# Patient Record
Sex: Male | Born: 1955
Health system: Southern US, Community
[De-identification: ages and names within clinical notes are randomized; demographics above are authoritative.]

## PROBLEM LIST (undated history)

## (undated) DIAGNOSIS — G5600 Carpal tunnel syndrome, unspecified upper limb: Secondary | ICD-10-CM

## (undated) DIAGNOSIS — I517 Cardiomegaly: Secondary | ICD-10-CM

## (undated) DIAGNOSIS — N529 Male erectile dysfunction, unspecified: Secondary | ICD-10-CM

## (undated) DIAGNOSIS — Z9189 Other specified personal risk factors, not elsewhere classified: Secondary | ICD-10-CM

## (undated) DIAGNOSIS — E291 Testicular hypofunction: Secondary | ICD-10-CM

## (undated) DIAGNOSIS — C61 Malignant neoplasm of prostate: Secondary | ICD-10-CM

## (undated) DIAGNOSIS — H933X9 Disorders of unspecified acoustic nerve: Secondary | ICD-10-CM

## (undated) DIAGNOSIS — M25569 Pain in unspecified knee: Secondary | ICD-10-CM

## (undated) DIAGNOSIS — H814 Vertigo of central origin: Secondary | ICD-10-CM

## (undated) DIAGNOSIS — I10 Essential (primary) hypertension: Secondary | ICD-10-CM

## (undated) DIAGNOSIS — E785 Hyperlipidemia, unspecified: Secondary | ICD-10-CM

## (undated) DIAGNOSIS — E119 Type 2 diabetes mellitus without complications: Secondary | ICD-10-CM

## (undated) DIAGNOSIS — N400 Enlarged prostate without lower urinary tract symptoms: Secondary | ICD-10-CM

## (undated) DIAGNOSIS — R399 Unspecified symptoms and signs involving the genitourinary system: Secondary | ICD-10-CM

## (undated) DIAGNOSIS — Z973 Presence of spectacles and contact lenses: Secondary | ICD-10-CM

## (undated) DIAGNOSIS — I639 Cerebral infarction, unspecified: Secondary | ICD-10-CM

## (undated) HISTORY — DX: Carpal tunnel syndrome, unspecified upper limb: G56.00

## (undated) HISTORY — DX: Disorders of unspecified acoustic nerve: H93.3X9

## (undated) HISTORY — PX: PROSTATE BIOPSY: SHX241

## (undated) HISTORY — DX: Testicular hypofunction: E29.1

## (undated) HISTORY — DX: Cerebral infarction, unspecified: I63.9

## (undated) HISTORY — PX: UMBILICAL HERNIA REPAIR: SHX196

## (undated) HISTORY — PX: COLONOSCOPY: SHX174

## (undated) HISTORY — DX: Malignant neoplasm of prostate: C61

## (undated) HISTORY — DX: Pain in unspecified knee: M25.569

## (undated) HISTORY — DX: Cardiomegaly: I51.7

## (undated) HISTORY — DX: Vertigo of central origin: H81.4

## (undated) HISTORY — PX: OTHER SURGICAL HISTORY: SHX169

## (undated) HISTORY — DX: Hyperlipidemia, unspecified: E78.5

## (undated) HISTORY — PX: UPPER GASTROINTESTINAL ENDOSCOPY: SHX188

## (undated) HISTORY — DX: Essential (primary) hypertension: I10

---

## 2000-09-21 ENCOUNTER — Ambulatory Visit (HOSPITAL_COMMUNITY): Admission: RE | Admit: 2000-09-21 | Discharge: 2000-09-21 | Payer: Self-pay | Admitting: Gastroenterology

## 2000-11-02 ENCOUNTER — Ambulatory Visit (HOSPITAL_COMMUNITY): Admission: RE | Admit: 2000-11-02 | Discharge: 2000-11-02 | Payer: Self-pay | Admitting: Gastroenterology

## 2000-11-24 ENCOUNTER — Encounter: Admission: RE | Admit: 2000-11-24 | Discharge: 2000-11-24 | Payer: Self-pay | Admitting: Gastroenterology

## 2000-11-24 ENCOUNTER — Encounter: Payer: Self-pay | Admitting: Gastroenterology

## 2003-11-05 ENCOUNTER — Ambulatory Visit (HOSPITAL_COMMUNITY): Admission: RE | Admit: 2003-11-05 | Discharge: 2003-11-05 | Payer: Self-pay | Admitting: General Surgery

## 2004-11-27 ENCOUNTER — Emergency Department (HOSPITAL_COMMUNITY): Admission: EM | Admit: 2004-11-27 | Discharge: 2004-11-27 | Payer: Self-pay | Admitting: Emergency Medicine

## 2009-08-24 DIAGNOSIS — E785 Hyperlipidemia, unspecified: Secondary | ICD-10-CM | POA: Insufficient documentation

## 2012-08-08 HISTORY — PX: ROTATOR CUFF REPAIR: SHX139

## 2013-12-31 ENCOUNTER — Other Ambulatory Visit: Payer: Self-pay | Admitting: Endocrinology

## 2013-12-31 DIAGNOSIS — R519 Headache, unspecified: Secondary | ICD-10-CM

## 2013-12-31 DIAGNOSIS — R51 Headache: Principal | ICD-10-CM

## 2014-01-02 ENCOUNTER — Ambulatory Visit
Admission: RE | Admit: 2014-01-02 | Discharge: 2014-01-02 | Disposition: A | Discharge status: Home / Self Care | Payer: Managed Care, Other (non HMO) | Source: Ambulatory Visit | Attending: Endocrinology | Admitting: Endocrinology

## 2014-01-02 ENCOUNTER — Other Ambulatory Visit: Payer: Self-pay

## 2014-01-02 DIAGNOSIS — R51 Headache: Principal | ICD-10-CM

## 2014-01-02 DIAGNOSIS — R519 Headache, unspecified: Secondary | ICD-10-CM

## 2014-11-14 ENCOUNTER — Encounter: Payer: Self-pay | Admitting: Neurology

## 2014-11-18 ENCOUNTER — Encounter: Payer: Self-pay | Admitting: Neurology

## 2014-11-18 ENCOUNTER — Ambulatory Visit (INDEPENDENT_AMBULATORY_CARE_PROVIDER_SITE_OTHER): Payer: Managed Care, Other (non HMO) | Admitting: Neurology

## 2014-11-18 VITALS — BP 190/92 | HR 90 | Resp 24 | Ht 69.0 in | Wt 239.7 lb

## 2014-11-18 DIAGNOSIS — R42 Dizziness and giddiness: Secondary | ICD-10-CM

## 2014-11-18 DIAGNOSIS — I10 Essential (primary) hypertension: Secondary | ICD-10-CM | POA: Insufficient documentation

## 2014-11-18 NOTE — Progress Notes (Signed)
NEUROLOGY CONSULTATION NOTE  George Franco MRN: [id number] DOB: 08/20/1955  Referring provider: Dr. Forde Dandy Primary care provider: Dr. Forde Dandy  Reason for consult:  Dizziness  HISTORY OF PRESENT ILLNESS: George Franco is a 59 year old right-handed man with type 2 diabetes, BPH, hyperlipidemia and hypertension and former smoker who presents for headache.  Records and some labs reviewed.  Symptoms started about a year ago.  When he goes to sleep, he feels okay.  When he wakes up in the morning, he feels okay, until he gets up out of bed.  He develops a sensation of dizziness and unsteadiness.  There is some component of spinning sensation.  There is no lightheadedness, nausea, slurred speech, focal numbness or weakness or vision loss.  The symptoms fluctuate but persist throughout the day, however they tend to improve as the day progresses and he usually feels fine by bedtime.  It is exacerbated by movement but not completely positional.  Driving can exacerbate symptoms.  The headache is very mild and occasional.  It doesn't bother him as much as the dizziness.  However, sometimes he notes that the top of his head feels numb when he touches it.  He denies neck pain.  He denies tinnitus or hearing loss.  He has been taking meclizine, which is ineffective.  He had a CT of the head that was reportedly unremarkable.  His systolic blood pressure today is 190.  Prior readings have been 329 and 924 systolic.  He thinks that his blood pressure has not been controlled for some time, at least a year.  Sometimes, he notes flashes in the corner of his eyes.  PAST MEDICAL HISTORY: Past Medical History  Diagnosis Date  . Diabetes   . Hypertension   . Hyperlipidemia   . LVH (left ventricular hypertrophy)   . BPH (benign prostatic hypertrophy)   . CTS (carpal tunnel syndrome)   . Hypogonadism in male     PAST SURGICAL HISTORY: Past Surgical History  Procedure Laterality Date  . Hernia repair    .  Rotator cuff repair  2014    MEDICATIONS: Current Outpatient Prescriptions on File Prior to Visit  Medication Sig Dispense Refill  . amLODipine (NORVASC) 2.5 MG tablet Take 2.5 mg by mouth daily.    . benazepril (LOTENSIN) 40 MG tablet Take 40 mg by mouth daily.    . Choline Fenofibrate 135 MG capsule Take 135 mg by mouth daily.    . empagliflozin (JARDIANCE) 25 MG TABS tablet Take 25 mg by mouth daily.    . insulin lispro (HUMALOG) 100 UNIT/ML injection Inject into the skin 3 (three) times daily before meals. 40-30-40    . meclizine (ANTIVERT) 25 MG tablet Take 25 mg by mouth 3 (three) times daily as needed for dizziness.    Marland Kitchen omega-3 acid ethyl esters (LOVAZA) 1 G capsule Take by mouth 2 (two) times daily.    . tadalafil (CIALIS) 5 MG tablet Take 5 mg by mouth daily as needed for erectile dysfunction.     No current facility-administered medications on file prior to visit.    ALLERGIES: Not on File  FAMILY HISTORY: Family History  Problem Relation Age of Onset  . Prostate cancer Father   . Heart failure Mother   . Diabetes Mother     SOCIAL HISTORY: History   Social History  . Marital Status: Married    Spouse Name: N/A  . Number of Children: N/A  . Years of Education: N/A   Occupational  History  . Not on file.   Social History Main Topics  . Smoking status: Former Smoker -- 1.00 packs/day    Types: Cigarettes  . Smokeless tobacco: Not on file  . Alcohol Use: No  . Drug Use: No  . Sexual Activity:    Partners: Female   Other Topics Concern  . Not on file   Social History Narrative    REVIEW OF SYSTEMS: Constitutional: No fevers, chills, or sweats, no generalized fatigue, change in appetite Eyes: No visual changes, double vision, eye pain Ear, nose and throat: No hearing loss, ear pain, nasal congestion, sore throat Cardiovascular: No chest pain, palpitations Respiratory:  No shortness of breath at rest or with exertion, wheezes GastrointestinaI: No  nausea, vomiting, diarrhea, abdominal pain, fecal incontinence Genitourinary:  No dysuria, urinary retention or frequency Musculoskeletal:  No neck pain, back pain Integumentary: No rash, pruritus, skin lesions Neurological: as above Psychiatric: No depression, insomnia, anxiety Endocrine: No palpitations, fatigue, diaphoresis, mood swings, change in appetite, change in weight, increased thirst Hematologic/Lymphatic:  No anemia, purpura, petechiae. Allergic/Immunologic: no itchy/runny eyes, nasal congestion, recent allergic reactions, rashes  PHYSICAL EXAM: Filed Vitals:   11/18/14 1502  BP: 190/92  Pulse: 90  Resp: 24   General: No acute distress Head:  Normocephalic/atraumatic Eyes:  fundi unremarkable, without vessel changes, exudates, hemorrhages or papilledema. Neck: supple, no paraspinal tenderness, full range of motion Back: No paraspinal tenderness Heart: regular rate and rhythm Lungs: Clear to auscultation bilaterally. Vascular: No carotid bruits. Neurological Exam: Mental status: alert and oriented to person, place, and time, recent and remote memory intact, fund of knowledge intact, attention and concentration intact, speech fluent and not dysarthric, language intact. Cranial nerves: CN I: not tested CN II: pupils equal, round and reactive to light, visual fields intact, fundi unremarkable, without vessel changes, exudates, hemorrhages or papilledema. CN III, IV, VI:  full range of motion, no nystagmus, no ptosis CN V: facial sensation intact CN VII: upper and lower face symmetric CN VIII: hearing intact CN IX, X: gag intact, uvula midline CN XI: sternocleidomastoid and trapezius muscles intact CN XII: tongue midline Bulk & Tone: normal, no fasciculations. Motor:  5/5 throughout Sensation:  Temperature and vibration intact Deep Tendon Reflexes:  2+ throughout, toes downgoing Finger to nose testing:  No dysmetria Heel to shin:  No dysmetria Gait:  Normal station  and stride.  Able to turn.  Some difficulty walking in tandem. Romberg negative.  IMPRESSION: Dizziness.  Etiology unclear and symptoms vague.  He exhibits no focal localizable symptoms to support a central cause.  It may be related to uncontrolled blood pressure. Hypertension  PLAN: We will get MRI of the brain If MRI is unremarkable, I would focus on treating the blood pressure.  Thank you for allowing me to take part in the care of this patient.  Metta Clines, DO  CC:  [name], MD

## 2014-11-18 NOTE — Patient Instructions (Signed)
I think it is possible that the dizziness may be related to the elevated blood pressure. 1.  I would work on blood pressure control with Dr. Forde Dandy. 2.  We will get an MRI of the brain to look for any other possible cause 3.  I will let you know what the results show.  If unremarkable, I really don't have another explanation.  I would focus on blood pressure control

## 2014-11-24 ENCOUNTER — Inpatient Hospital Stay: Admission: RE | Admit: 2014-11-24 | Payer: Managed Care, Other (non HMO) | Source: Ambulatory Visit

## 2014-12-01 ENCOUNTER — Ambulatory Visit
Admission: RE | Admit: 2014-12-01 | Discharge: 2014-12-01 | Disposition: A | Discharge status: Home / Self Care | Payer: Managed Care, Other (non HMO) | Source: Ambulatory Visit | Attending: Neurology | Admitting: Neurology

## 2014-12-01 DIAGNOSIS — R42 Dizziness and giddiness: Secondary | ICD-10-CM

## 2014-12-02 ENCOUNTER — Telehealth: Payer: Self-pay | Admitting: *Deleted

## 2014-12-02 NOTE — Telephone Encounter (Signed)
I left message for patient to return my call regarding MRI

## 2014-12-02 NOTE — Telephone Encounter (Signed)
-----   Message from Pieter Partridge, DO sent at 12/01/2014 12:36 PM EDT ----- MRI of brain shows no cause for dizziness.  There does appear to be some narrowing of the internal ear canals bilaterally, due to bone overgrowth.  However, I believe this is an incidental finding and not clinically relevant. ----- Message -----    From: Rad Results In Interface    Sent: 12/01/2014   8:31 AM      To: Pieter Partridge, DO

## 2014-12-03 ENCOUNTER — Telehealth: Payer: Self-pay | Admitting: *Deleted

## 2014-12-03 NOTE — Telephone Encounter (Signed)
Patient is aware of MRI results

## 2014-12-03 NOTE — Telephone Encounter (Signed)
-----   Message from Pieter Partridge, DO sent at 12/01/2014 12:36 PM EDT ----- MRI of brain shows no cause for dizziness.  There does appear to be some narrowing of the internal ear canals bilaterally, due to bone overgrowth.  However, I believe this is an incidental finding and not clinically relevant. ----- Message -----    From: Rad Results In Interface    Sent: 12/01/2014   8:31 AM      To: Pieter Partridge, DO

## 2015-01-23 ENCOUNTER — Encounter: Payer: Self-pay | Admitting: Radiation Oncology

## 2015-01-23 NOTE — Progress Notes (Signed)
GU Location of Tumor / Histology: prostatic adenocarcinoma  If Prostate Cancer, Gleason Score is (4 + 3) and PSA is (4.48) on 11/27/2014.  Selassie Spatafore presented 1/13 with a PSA of 2.12 that increased to 3.0 by 6/13.  Biopsies of prostate (if applicable) revealed:    Past/Anticipated interventions by urology, if any: prostate biopsy and referral to radiation oncology; may need ADT  Past/Anticipated interventions by medical oncology, if any: no  Weight changes, if any: no  Bowel/Bladder complaints, if any: nocturia   Nausea/Vomiting, if any: no  Pain issues, if any:  no  SAFETY ISSUES:  Prior radiation? no  Pacemaker/ICD? no  Possible current pregnancy? no  Is the patient on methotrexate? no  Current Complaints / other details:  59 year old male. Most interested in radioactive seeds. Told he may also require external beam radiation therapy and possible ADT. Prostate volume 33 cc. NKDA. Married.

## 2015-01-26 ENCOUNTER — Ambulatory Visit
Admission: RE | Admit: 2015-01-26 | Discharge: 2015-01-26 | Disposition: A | Discharge status: Home / Self Care | Payer: Managed Care, Other (non HMO) | Source: Ambulatory Visit | Attending: Radiation Oncology | Admitting: Radiation Oncology

## 2015-01-26 ENCOUNTER — Encounter: Payer: Self-pay | Admitting: Radiation Oncology

## 2015-01-26 ENCOUNTER — Encounter: Payer: Self-pay | Admitting: Medical Oncology

## 2015-01-26 VITALS — BP 173/72 | HR 93 | Resp 16 | Ht 69.0 in | Wt 242.9 lb

## 2015-01-26 DIAGNOSIS — Z794 Long term (current) use of insulin: Secondary | ICD-10-CM | POA: Diagnosis not present

## 2015-01-26 DIAGNOSIS — C61 Malignant neoplasm of prostate: Secondary | ICD-10-CM | POA: Insufficient documentation

## 2015-01-26 DIAGNOSIS — E119 Type 2 diabetes mellitus without complications: Secondary | ICD-10-CM | POA: Insufficient documentation

## 2015-01-26 DIAGNOSIS — I10 Essential (primary) hypertension: Secondary | ICD-10-CM | POA: Diagnosis not present

## 2015-01-26 DIAGNOSIS — Z510 Encounter for antineoplastic radiation therapy: Secondary | ICD-10-CM | POA: Diagnosis not present

## 2015-01-26 DIAGNOSIS — E785 Hyperlipidemia, unspecified: Secondary | ICD-10-CM | POA: Insufficient documentation

## 2015-01-26 DIAGNOSIS — N400 Enlarged prostate without lower urinary tract symptoms: Secondary | ICD-10-CM | POA: Insufficient documentation

## 2015-01-26 DIAGNOSIS — Z79899 Other long term (current) drug therapy: Secondary | ICD-10-CM | POA: Insufficient documentation

## 2015-01-26 NOTE — Progress Notes (Addendum)
Denies dysuria or visible hematuria. Describes urine stream as a stop and start for as long as he can remember. Reports after sitting for long periods he develop femur pain. Reports nights sweats. Denies unintentional weight. Denies pain with bowel movements or blood in stool. Denies urgency. Denies incontinence or leakage. Reports erectile dysfunction. Reports Cialis 5 mg does not work to relieve ED.

## 2015-01-26 NOTE — Progress Notes (Signed)
See progress note under physician encounter. 

## 2015-01-26 NOTE — Progress Notes (Signed)
Oncology Nurse Navigator Documentation  Oncology Nurse Navigator Flowsheets 01/26/2015  Navigator Encounter Type Initial RadOnc  Patient Visit Type Radonc-I introduced myself to Mr. Viloria and discussed my role as the Prostate navigator. He was very nervous about being here today. I gave him my business card and I will continue to follow him once the treatment plan is decided.   Barriers/Navigation Needs No barriers at this time  Time Spent with Patient 30

## 2015-01-26 NOTE — Progress Notes (Signed)
Radiation Oncology         [phone] ________________________________  Initial outpatient Consultation  Name: George Franco MRN: [id number]  Date: 01/26/2015  DOB: 05-Oct-1955  BW:IOMBT,DHRCBUL George Haste, MD  [name], MD   REFERRING PHYSICIAN: [name], MD  DIAGNOSIS: 59 y.o. gentleman with stage T1c adenocarcinoma of the prostate with a Gleason's score of 4+3 and a PSA of 4.48    ICD-9-CM ICD-10-CM   1. Malignant neoplasm of prostate George Franco is a 59 y.o. gentleman.  He was noted to have an elevated PSA of 4.06 by his primary care physician, Dr. Forde Dandy.  Accordingly, he was referred for evaluation in urology by Dr. Karsten Ro. Digital rectal examination was performed at that time revealing no nodules.  The patient proceeded to transrectal ultrasound with 12 biopsies of the prostate on 11/27/2014.  The prostate volume measured 32.9 cc.  Out of 12 core biopsies,2 were positive.  The maximum Gleason score was 4+3, and this was seen in the right lateral apex.  The patient reviewed the biopsy results with his urologist and he has kindly been referred today for discussion of potential radiation treatment options.    PREVIOUS RADIATION THERAPY: No  PAST MEDICAL HISTORY:  has a past medical history of Diabetes; Hypertension; Hyperlipidemia; LVH (left ventricular hypertrophy); BPH (benign prostatic hypertrophy); CTS (carpal tunnel syndrome); Hypogonadism in male; and Prostate cancer.    PAST SURGICAL HISTORY: Past Surgical History  Procedure Laterality Date  . Hernia repair    . Rotator cuff repair  2014  . Prostate biopsy      FAMILY HISTORY: family history includes Cancer in his father; Diabetes in his mother; Heart failure in his mother; Prostate cancer in his father.  SOCIAL HISTORY:  reports that he has quit smoking. His smoking use included Cigarettes. He smoked 1.00 pack per day. He does not have any smokeless tobacco history on  file. He reports that he does not drink alcohol or use illicit drugs.  ALLERGIES: Review of patient's allergies indicates no known allergies.  MEDICATIONS:  Current Outpatient Prescriptions  Medication Sig Dispense Refill  . amLODipine (NORVASC) 2.5 MG tablet Take 2.5 mg by mouth daily.    . benazepril (LOTENSIN) 40 MG tablet Take 40 mg by mouth daily.    . empagliflozin (JARDIANCE) 25 MG TABS tablet Take 25 mg by mouth daily.    Marland Kitchen HUMALOG MIX 50/50 (50-50) 100 UNIT/ML SUSP injection     . meclizine (ANTIVERT) 25 MG tablet Take 25 mg by mouth 3 (three) times daily as needed for dizziness.    . tadalafil (CIALIS) 5 MG tablet Take 5 mg by mouth daily as needed for erectile dysfunction.    . Choline Fenofibrate 135 MG capsule Take 135 mg by mouth daily.    . insulin lispro (HUMALOG) 100 UNIT/ML injection Inject into the skin 3 (three) times daily before meals. 40-30-40    . omega-3 acid ethyl esters (LOVAZA) 1 G capsule Take by mouth 2 (two) times daily.     No current facility-administered medications for this encounter.    REVIEW OF SYSTEMS:  A 15 point review of systems is documented in the electronic medical record.  Denies dysuria or visible hematuria. Describes urine stream as a stop and start for as long as he can remember. Reports after sitting for long periods he develops femur pain. Reports night sweats. Denies unintentional weight. Denies pain with bowel movements or blood in stool. Denies  urgency. Denies incontinence or leakage. Reports erectile dysfunction. Reports Cialis 5 mg does not work to relieve ED.  This was obtained by the nursing staff. However, I reviewed this with the patient to discuss relevant findings and make appropriate changes.  A comprehensive review of systems was negative..  The patient completed an IPSS and IIEF questionnaire.  His IPSS score was 7 indicating mild urinary outflow obstructive symptoms.  He indicated that his erectile function is unable to complete  sexual activity.   PHYSICAL EXAM: This patient is in no acute distress.  He is alert and oriented.   height is 5\' 9"  (1.753 m) and weight is 242 lb 14.4 oz (110.179 kg). His blood pressure is 173/72 and his pulse is 93. His respiration is 16 and oxygen saturation is 100%.  He exhibits no respiratory distress or labored breathing.  He appears neurologically intact.  His mood is pleasant.  His affect is appropriate.  Please note the digital rectal exam findings described above.  KPS = 100  100 - Normal; no complaints; no evidence of disease. 90   - Able to carry on normal activity; minor signs or symptoms of disease. 80   - Normal activity with effort; some signs or symptoms of disease. 75   - Cares for self; unable to carry on normal activity or to do active work. 60   - Requires occasional assistance, but is able to care for most of his personal needs. 50   - Requires considerable assistance and frequent medical care. 65   - Disabled; requires special care and assistance. 80   - Severely disabled; hospital admission is indicated although death not imminent. 70   - Very sick; hospital admission necessary; active supportive treatment necessary. 10   - Moribund; fatal processes progressing rapidly. 0     - Dead  Karnofsky DA, Abelmann WH, Craver LS and Burchenal JH [phone]) The use of the nitrogen mustards in the palliative treatment of carcinoma: with particular reference to bronchogenic carcinoma Cancer 1 634-56   LABORATORY DATA:  No results found for: WBC, HGB, HCT, MCV, PLT No results found for: NA, K, CL, CO2 No results found for: ALT, AST, GGT, ALKPHOS, BILITOT   RADIOGRAPHY: No results found.    IMPRESSION: This gentleman is a 59 yo with stage T1c adenocarcinoma of the prostate with a Gleason's score of 4+3 and a PSA of 4.48. His T-Stage, Gleason's Score, and PSA put him into the intermediate risk group.  Accordingly he is eligible for a variety of potential treatment options including  radiotherapy with or without hormone therapy.  PLAN: Today I reviewed the findings and workup thus far.  We discussed the natural history of prostate cancer.  We reviewed the the implications of T-stage, Gleason's Score, and PSA on decision-making and outcomes in prostate cancer.  We discussed radiation treatment in the management of prostate cancer with regard to the logistics and delivery of external beam radiation treatment as well as the logistics and delivery of prostate brachytherapy.  We compared and contrasted each of these approaches and also compared these against prostatectomy.  The patient expressed interest in external beam radiotherapy followed by seed boost.  I filled out a patient counseling form for him with relevant treatment diagrams and we retained a copy for our records.   Discussed concurrent hormone therapy with radiotherapy and its potential benefits. He is okay with hormone therapy since it increases his disease free survival.  The patient would like to proceed with  androgen deprivation, external radiation and prostate seed implant boost.  I will share my findings with Dr. Karsten Ro and move forward with scheduling hormone therapy now and placement of three gold fiducial markers into the prostate to proceed with radiotherapy 2 months after starting hormone therapy.  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  I enjoyed meeting with him today, and will look forward to participating in the care of this very nice gentleman.  I spent 60 minutes face to face with the patient and more than 50% of that time was spent in counseling and/or coordination of care.  This document serves as a record of services personally performed by [name], MD. It was created on his behalf by Arlyce Harman, a trained medical scribe. The creation of this record is based on the scribe's personal observations and the provider's statements to  them. This document has been checked and approved by the attending provider.     ------------------------------------------------  George Franco, M.D.

## 2015-01-27 ENCOUNTER — Telehealth: Payer: Self-pay | Admitting: *Deleted

## 2015-01-27 NOTE — Telephone Encounter (Signed)
CALLED PATIENT TO INFORM OF HORMONE INJ. ON 02/03/15- ARRIVAL TIME - 2 PM @ DR. OTTELIN'S OFFICE, AND HIS GOLD SEED MARKER APPT. ON 02/19/15 @ 1 PM @ DR. OTTELIN'S  OFFICE AND HIS SIM APPT. ON 03-27-15 @ 2 PM @ DR. MANNING'S OFFICE, PATIENT TO START TX. ON 04-06-15, LVM FOR A RETURN CALL

## 2015-03-27 ENCOUNTER — Ambulatory Visit
Admission: RE | Admit: 2015-03-27 | Discharge: 2015-03-27 | Disposition: A | Discharge status: Home / Self Care | Payer: Managed Care, Other (non HMO) | Source: Ambulatory Visit | Attending: Radiation Oncology | Admitting: Radiation Oncology

## 2015-03-27 DIAGNOSIS — C61 Malignant neoplasm of prostate: Secondary | ICD-10-CM | POA: Diagnosis not present

## 2015-03-27 NOTE — Progress Notes (Addendum)
  Radiation Oncology         [phone] ________________________________  Name: George Franco MRN: [id number]  Date: 03/27/2015  DOB: [birth year]  SIMULATION AND TREATMENT PLANNING NOTE    ICD-9-CM ICD-10-CM   1. Malignant neoplasm of prostate 185 C61     DIAGNOSIS:  59 yo with stage T1c adenocarcinoma of the prostate with a Gleason's score of 4+3 and a PSA of 4.48  NARRATIVE:  The patient was brought to the [place].  Identity was confirmed.  All relevant records and images related to the planned course of therapy were reviewed.  The patient freely provided informed written consent to proceed with treatment after reviewing the details related to the planned course of therapy. The consent form was witnessed and verified by the simulation staff.  Then, the patient was set-up in a stable reproducible supine position for radiation therapy.  A vacuum lock pillow device was custom fabricated to position his legs in a reproducible immobilized position.  Then, I performed a urethrogram under sterile conditions to identify the prostatic apex.  CT images were obtained.  Surface markings were placed.  The CT images were loaded into the planning software.  Then the prostate target and avoidance structures including the rectum, bladder, bowel and hips were contoured.  Treatment planning then occurred.  The radiation prescription was entered and confirmed.  A total of 5 complex treatment devices were fabricated including bodyFix and 4 MLCs. I have requested : 3D Simulation  I have requested a DVH of the following structures: prostate, rectum, right hip, and left hip.  PUBIC ARCH EVALUATION:  The patient also evaluation for possible prostate seed implant boost. His 3-dimensional image study set was uploaded to the planning computer. There, on each axial slice, I contoured the prostate gland. Then, using three-dimensional radiation planning tools I reconstructed the prostate in view of the  structures from the transperineal needle pathway to assess for possible pubic arch interference. In doing so, I did not appreciate any pubic arch interference. Also, the patient's prostate volume was estimated based on the drawn structure. The volume was 38 cc.  Given the pubic arch appearance and prostate volume, patient remains a good candidate to proceed with prostate seed implant.     PLAN:  The patient will receive 45 Gy in 25 fractions plus seed boost.  ________________________________  Sheral Apley. Tammi Klippel, M.D.

## 2015-03-29 NOTE — Addendum Note (Signed)
Encounter addended by: [name], MD on: 03/29/2015  4:04 PM<BR>     Documentation filed: Clinical Notes, Notes Section

## 2015-04-02 DIAGNOSIS — C61 Malignant neoplasm of prostate: Secondary | ICD-10-CM | POA: Diagnosis not present

## 2015-04-03 ENCOUNTER — Ambulatory Visit
Admission: RE | Admit: 2015-04-03 | Discharge: 2015-04-03 | Disposition: A | Discharge status: Home / Self Care | Payer: Managed Care, Other (non HMO) | Source: Ambulatory Visit | Attending: Radiation Oncology | Admitting: Radiation Oncology

## 2015-04-03 DIAGNOSIS — C61 Malignant neoplasm of prostate: Secondary | ICD-10-CM | POA: Diagnosis not present

## 2015-04-06 ENCOUNTER — Ambulatory Visit
Admission: RE | Admit: 2015-04-06 | Discharge: 2015-04-06 | Disposition: A | Discharge status: Home / Self Care | Payer: Managed Care, Other (non HMO) | Source: Ambulatory Visit | Attending: Radiation Oncology | Admitting: Radiation Oncology

## 2015-04-06 DIAGNOSIS — C61 Malignant neoplasm of prostate: Secondary | ICD-10-CM | POA: Diagnosis not present

## 2015-04-07 ENCOUNTER — Ambulatory Visit
Admission: RE | Admit: 2015-04-07 | Discharge: 2015-04-07 | Disposition: A | Discharge status: Home / Self Care | Payer: Managed Care, Other (non HMO) | Source: Ambulatory Visit | Attending: Radiation Oncology | Admitting: Radiation Oncology

## 2015-04-07 ENCOUNTER — Ambulatory Visit: Payer: Managed Care, Other (non HMO)

## 2015-04-07 DIAGNOSIS — C61 Malignant neoplasm of prostate: Secondary | ICD-10-CM | POA: Diagnosis not present

## 2015-04-08 ENCOUNTER — Ambulatory Visit: Payer: Managed Care, Other (non HMO)

## 2015-04-08 ENCOUNTER — Ambulatory Visit
Admission: RE | Admit: 2015-04-08 | Discharge: 2015-04-08 | Disposition: A | Discharge status: Home / Self Care | Payer: Managed Care, Other (non HMO) | Source: Ambulatory Visit | Attending: Radiation Oncology | Admitting: Radiation Oncology

## 2015-04-08 DIAGNOSIS — C61 Malignant neoplasm of prostate: Secondary | ICD-10-CM | POA: Diagnosis not present

## 2015-04-09 ENCOUNTER — Encounter: Payer: Self-pay | Admitting: Medical Oncology

## 2015-04-09 ENCOUNTER — Ambulatory Visit
Admission: RE | Admit: 2015-04-09 | Discharge: 2015-04-09 | Disposition: A | Discharge status: Home / Self Care | Payer: Managed Care, Other (non HMO) | Source: Ambulatory Visit | Attending: Radiation Oncology | Admitting: Radiation Oncology

## 2015-04-09 DIAGNOSIS — C61 Malignant neoplasm of prostate: Secondary | ICD-10-CM | POA: Diagnosis not present

## 2015-04-10 ENCOUNTER — Ambulatory Visit
Admission: RE | Admit: 2015-04-10 | Discharge: 2015-04-10 | Disposition: A | Discharge status: Home / Self Care | Payer: Managed Care, Other (non HMO) | Source: Ambulatory Visit | Attending: Radiation Oncology | Admitting: Radiation Oncology

## 2015-04-10 ENCOUNTER — Encounter: Payer: Self-pay | Admitting: Radiation Oncology

## 2015-04-10 VITALS — BP 173/77 | HR 104 | Resp 16 | Wt 229.7 lb

## 2015-04-10 DIAGNOSIS — C61 Malignant neoplasm of prostate: Secondary | ICD-10-CM

## 2015-04-10 NOTE — Progress Notes (Signed)
  Radiation Oncology         [phone]   Name: George Franco MRN: [id number]   Date: 04/10/2015  DOB: October 06, 1955   Weekly Radiation Therapy Management    ICD-9-CM ICD-10-CM   1. Malignant neoplasm of prostate 185 C61     Current Dose: 9 Gy  Planned Dose:  45 Gy plus seed implant boost  Narrative The patient presents for routine under treatment assessment.  Vitals stable. Denies pain. Reports nocturia x 1. Denies dysuria, hematuria, urgency, leakage or incontinence. Denies diarrhea. Denies fatigue. Weight loss noted. Patient reports he is eating more fruit and exercising.  .  The patient is without complaint. Set-up films were reviewed. The chart was checked.  Physical Findings  weight is 229 lb 11.2 oz (104.191 kg). His blood pressure is 173/77 and his pulse is 104. His respiration is 16 and oxygen saturation is 100%. . Weight essentially stable.  No significant changes.  Impression The patient is tolerating radiation.  Plan Nursing, oriented patient to staff and routine of the clinic. Provided patient with RADIATION THERAPY AND YOU handbook then, reviewed pertinent information. Educated patient reference potential side effects and management such as diarrhea, fatigue, and urinary/bladder changes.   Continue treatment as planned.    This document serves as a record of services personally performed by [name], MD. It was created on his behalf by Arlyce Harman, a trained medical scribe. The creation of this record is based on the scribe's personal observations and the provider's statements to them. This document has been checked and approved by the attending provider.       Sheral Apley Tammi Klippel, M.D.

## 2015-04-10 NOTE — Progress Notes (Addendum)
Vitals stable. Denies pain. Reports nocturia x 1. Denies dysuria, hematuria, urgency, leakage or incontinence. Denies diarrhea. Denies fatigue. Weight loss noted. Patient reports he is eating more fruit and exercising. Oriented patient to staff and routine of the clinic. Provided patient with RADIATION THERAPY AND YOU handbook then, reviewed pertinent information. Educated patient reference potential side effects and management such as diarrhea, fatigue, and urinary/bladder changes. Answered all patient questions to the best of my ability. Provided patient with my business card and encouraged him to call with needs. Patient verbalized understanding of all reviewed.   BP 173/77 mmHg  Pulse 104  Resp 16  Wt 229 lb 11.2 oz (104.191 kg)  SpO2 100% Wt Readings from Last 3 Encounters:  04/10/15 229 lb 11.2 oz (104.191 kg)  01/26/15 242 lb 14.4 oz (110.179 kg)  11/18/14 239 lb 11.2 oz (108.727 kg)

## 2015-04-14 ENCOUNTER — Ambulatory Visit
Admission: RE | Admit: 2015-04-14 | Discharge: 2015-04-14 | Disposition: A | Discharge status: Home / Self Care | Payer: Managed Care, Other (non HMO) | Source: Ambulatory Visit | Attending: Radiation Oncology | Admitting: Radiation Oncology

## 2015-04-14 DIAGNOSIS — C61 Malignant neoplasm of prostate: Secondary | ICD-10-CM | POA: Diagnosis not present

## 2015-04-15 ENCOUNTER — Ambulatory Visit
Admission: RE | Admit: 2015-04-15 | Discharge: 2015-04-15 | Disposition: A | Discharge status: Home / Self Care | Payer: Managed Care, Other (non HMO) | Source: Ambulatory Visit | Attending: Radiation Oncology | Admitting: Radiation Oncology

## 2015-04-15 DIAGNOSIS — C61 Malignant neoplasm of prostate: Secondary | ICD-10-CM | POA: Diagnosis not present

## 2015-04-16 ENCOUNTER — Ambulatory Visit
Admission: RE | Admit: 2015-04-16 | Discharge: 2015-04-16 | Disposition: A | Discharge status: Home / Self Care | Payer: Managed Care, Other (non HMO) | Source: Ambulatory Visit | Attending: Radiation Oncology | Admitting: Radiation Oncology

## 2015-04-16 DIAGNOSIS — C61 Malignant neoplasm of prostate: Secondary | ICD-10-CM | POA: Diagnosis not present

## 2015-04-17 ENCOUNTER — Ambulatory Visit
Admission: RE | Admit: 2015-04-17 | Discharge: 2015-04-17 | Disposition: A | Discharge status: Home / Self Care | Payer: Managed Care, Other (non HMO) | Source: Ambulatory Visit | Attending: Radiation Oncology | Admitting: Radiation Oncology

## 2015-04-17 ENCOUNTER — Encounter: Payer: Self-pay | Admitting: Radiation Oncology

## 2015-04-17 VITALS — BP 170/78 | HR 81 | Temp 99.1°F | Resp 16 | Ht 69.0 in | Wt 236.4 lb

## 2015-04-17 DIAGNOSIS — C61 Malignant neoplasm of prostate: Secondary | ICD-10-CM

## 2015-04-17 NOTE — Progress Notes (Signed)
George Franco has completed 9 fractions to his prostate.  He denies pain. He reports urinating more frequently during the day and has to get up once at night.  He denies dysuria, hematuria, diarrhea, skin irritation and fatigue.  His bp is elevated today at 170/78.  Marland KitchenBP 170/78 mmHg  Pulse 81  Temp(Src) 99.1 F (37.3 C) (Oral)  Resp 16  Ht 5\' 9"  (1.753 m)  Wt 236 lb 6.4 oz (107.23 kg)  BMI 34.89 kg/m2

## 2015-04-17 NOTE — Progress Notes (Signed)
  Radiation Oncology         [phone]     Name: George Franco MRN: [id number]   Date: 04/17/2015  DOB: 10-17-1955   Weekly Radiation Therapy Management    ICD-9-CM ICD-10-CM   1. Malignant neoplasm of prostate 185 C61     Current Dose: 16.2 Gy  Planned Dose:  45 Gy plus seed implant boost  Narrative The patient presents for routine under treatment assessment.   George Franco has completed 9 fractions to his prostate. He denies pain. He reports urinating more frequently during the day and has to get up once at night. He denies dysuria, hematuria, diarrhea, skin irritation and fatigue The patient is without complaint. Set-up films were reviewed. The chart was checked.  Physical Findings  height is 5\' 9"  (1.753 m) and weight is 236 lb 6.4 oz (107.23 kg). His oral temperature is 99.1 F (37.3 C). His blood pressure is 170/78 and his pulse is 81. His respiration is 16. . Weight essentially stable.  No significant changes.  Impression The patient is tolerating radiation.  Plan Continue treatment as planned. BP to be followed by his PCP    This document serves as a record of services personally performed by [name], MD. It was created on his behalf by Arlyce Harman, a trained medical scribe. The creation of this record is based on the scribe's personal observations and the provider's statements to them. This document has been checked and approved by the attending provider.       Sheral Apley Tammi Klippel, M.D.

## 2015-04-20 ENCOUNTER — Telehealth: Payer: Self-pay | Admitting: Radiation Oncology

## 2015-04-20 ENCOUNTER — Ambulatory Visit
Admission: RE | Admit: 2015-04-20 | Discharge: 2015-04-20 | Disposition: A | Discharge status: Home / Self Care | Payer: Managed Care, Other (non HMO) | Source: Ambulatory Visit | Attending: Radiation Oncology | Admitting: Radiation Oncology

## 2015-04-20 DIAGNOSIS — C61 Malignant neoplasm of prostate: Secondary | ICD-10-CM | POA: Diagnosis not present

## 2015-04-20 NOTE — Telephone Encounter (Signed)
Lattie Haw, medical assistant, for Dr. Forde Dandy returned this RN's call. Relayed recent elevated BP readings to her. She plans to reach out to the patient and Dr. Forde Dandy with these findings.

## 2015-04-20 NOTE — Telephone Encounter (Signed)
Dr. Tammi Klippel expressed concern that patient's bp continues to be elevated. Left message for Lattie Haw, Dr. Annie [facility]'s medical assistant, informing her of these concerns. Detailed in the message this RN would be glad to fax records concerning this matter.

## 2015-04-21 ENCOUNTER — Ambulatory Visit
Admission: RE | Admit: 2015-04-21 | Discharge: 2015-04-21 | Disposition: A | Discharge status: Home / Self Care | Payer: Managed Care, Other (non HMO) | Source: Ambulatory Visit | Attending: Radiation Oncology | Admitting: Radiation Oncology

## 2015-04-21 DIAGNOSIS — C61 Malignant neoplasm of prostate: Secondary | ICD-10-CM | POA: Diagnosis not present

## 2015-04-22 ENCOUNTER — Ambulatory Visit
Admission: RE | Admit: 2015-04-22 | Discharge: 2015-04-22 | Disposition: A | Discharge status: Home / Self Care | Payer: Managed Care, Other (non HMO) | Source: Ambulatory Visit | Attending: Radiation Oncology | Admitting: Radiation Oncology

## 2015-04-22 DIAGNOSIS — C61 Malignant neoplasm of prostate: Secondary | ICD-10-CM | POA: Diagnosis not present

## 2015-04-23 ENCOUNTER — Ambulatory Visit
Admission: RE | Admit: 2015-04-23 | Discharge: 2015-04-23 | Disposition: A | Discharge status: Home / Self Care | Payer: Managed Care, Other (non HMO) | Source: Ambulatory Visit | Attending: Radiation Oncology | Admitting: Radiation Oncology

## 2015-04-23 DIAGNOSIS — C61 Malignant neoplasm of prostate: Secondary | ICD-10-CM | POA: Diagnosis not present

## 2015-04-24 ENCOUNTER — Ambulatory Visit
Admission: RE | Admit: 2015-04-24 | Discharge: 2015-04-24 | Disposition: A | Discharge status: Home / Self Care | Payer: Managed Care, Other (non HMO) | Source: Ambulatory Visit | Attending: Radiation Oncology | Admitting: Radiation Oncology

## 2015-04-24 ENCOUNTER — Encounter: Payer: Self-pay | Admitting: Radiation Oncology

## 2015-04-24 VITALS — BP 178/77 | HR 92 | Resp 16 | Wt 235.8 lb

## 2015-04-24 DIAGNOSIS — C61 Malignant neoplasm of prostate: Secondary | ICD-10-CM | POA: Diagnosis not present

## 2015-04-24 NOTE — Progress Notes (Addendum)
Weight stable. BP elevated. Reports bp meds were increased by PCP but, patient waiting on insurance clearance before he can pick up from the pharmacy. Reports on Wednesday night he got up three times to void but, every other night nocturia x 1. Reports low abdominal burning sensation when he voids. Denies hematuria. Denies diarrhea. Denies incontinence. Denies urgency. Reports mild fatigue.   BP 178/77 mmHg  Pulse 92  Resp 16  Wt 235 lb 12.8 oz (106.958 kg)  SpO2 100% Wt Readings from Last 3 Encounters:  04/24/15 235 lb 12.8 oz (106.958 kg)  04/17/15 236 lb 6.4 oz (107.23 kg)  04/10/15 229 lb 11.2 oz (104.191 kg)

## 2015-04-24 NOTE — Progress Notes (Signed)
  Radiation Oncology         [phone]     Name: George Franco MRN: [id number]   Date: 04/24/2015  DOB: March 18, 1956   Weekly Radiation Therapy Management    ICD-9-CM ICD-10-CM   1. Malignant neoplasm of prostate 185 C61    Malignant neoplasm of prostate  Current Dose: 25.2 Gy  Planned Dose:  45 Gy plus seed implant boost  Narrative The patient presents for routine under treatment assessment.  The patient's weight is stable. He has completed 14 out of 25 treatments thus far. He denies symptoms of hematuria, diarrhea, incontinence, or urgency. His blood pressure is elevated and to address this symptom his PCP has prescribed medications. He reports that on Wednesday night he got up three times to void but, every other night nocturia x 1. His vocalized symptom of major concern is low abdominal burning sensation when he voids. Additionally, the patient reports mild fatigue. Set-up films were reviewed. The chart was checked. The patient projected a health mental status and was not accompanied by family for today's radiation oncology appointment.    Physical Findings  weight is 235 lb 12.8 oz (106.958 kg). His blood pressure is 178/77 and his pulse is 92. His respiration is 16 and oxygen saturation is 100%.  The patient's weight is currently essentially stable.There is no significant changes to the status of the paients overall health to be noted at this time. The patient is alert and oriented x3.   Impression George Franco is a 59 year old gentleman presenting to clinic in regards to his malignant neoplasm of prostate. The patient is tolerating radiation. The patient understands the importance of maintaining a full bladder during treatment. The patient understands that he can access his appointments and medical records via [place].   Plan The patient is advised to continue treatment as planned. The patient was briefly educated on the benefits of maintaining a full bladder during treatment. Healthy  management methods in regards to the patient's vocalized symptoms of concerns were addressed. All vocalized questions and concerns have been addressed. If the patient develops any further questions or concerns in regards to his treatment and recovery, he has been encouraged to contact Dr. Tammi Klippel, MD.     This document serves as a record of services personally performed by [name], MD. It was created on his behalf by Arlyce Harman, a trained medical scribe. The creation of this record is based on the scribe's personal observations and the provider's statements to them. This document has been checked and approved by the attending provider.     ___________________________________________   Sheral Apley. Tammi Klippel, M.D.

## 2015-04-27 ENCOUNTER — Other Ambulatory Visit: Payer: Self-pay | Admitting: Urology

## 2015-04-27 ENCOUNTER — Ambulatory Visit
Admission: RE | Admit: 2015-04-27 | Discharge: 2015-04-27 | Disposition: A | Discharge status: Home / Self Care | Payer: Managed Care, Other (non HMO) | Source: Ambulatory Visit | Attending: Radiation Oncology | Admitting: Radiation Oncology

## 2015-04-27 DIAGNOSIS — Z510 Encounter for antineoplastic radiation therapy: Secondary | ICD-10-CM | POA: Diagnosis present

## 2015-04-27 DIAGNOSIS — C61 Malignant neoplasm of prostate: Secondary | ICD-10-CM | POA: Diagnosis not present

## 2015-04-28 ENCOUNTER — Ambulatory Visit
Admission: RE | Admit: 2015-04-28 | Discharge: 2015-04-28 | Disposition: A | Discharge status: Home / Self Care | Payer: Managed Care, Other (non HMO) | Source: Ambulatory Visit | Attending: Radiation Oncology | Admitting: Radiation Oncology

## 2015-04-28 DIAGNOSIS — Z510 Encounter for antineoplastic radiation therapy: Secondary | ICD-10-CM | POA: Diagnosis not present

## 2015-04-29 ENCOUNTER — Ambulatory Visit
Admission: RE | Admit: 2015-04-29 | Discharge: 2015-04-29 | Disposition: A | Discharge status: Home / Self Care | Payer: Managed Care, Other (non HMO) | Source: Ambulatory Visit | Attending: Radiation Oncology | Admitting: Radiation Oncology

## 2015-04-29 DIAGNOSIS — Z510 Encounter for antineoplastic radiation therapy: Secondary | ICD-10-CM | POA: Diagnosis not present

## 2015-04-30 ENCOUNTER — Ambulatory Visit
Admission: RE | Admit: 2015-04-30 | Discharge: 2015-04-30 | Disposition: A | Discharge status: Home / Self Care | Payer: Managed Care, Other (non HMO) | Source: Ambulatory Visit | Attending: Radiation Oncology | Admitting: Radiation Oncology

## 2015-04-30 DIAGNOSIS — Z510 Encounter for antineoplastic radiation therapy: Secondary | ICD-10-CM | POA: Diagnosis not present

## 2015-05-01 ENCOUNTER — Ambulatory Visit
Admission: RE | Admit: 2015-05-01 | Discharge: 2015-05-01 | Disposition: A | Discharge status: Home / Self Care | Payer: Managed Care, Other (non HMO) | Source: Ambulatory Visit | Attending: Radiation Oncology | Admitting: Radiation Oncology

## 2015-05-01 ENCOUNTER — Telehealth: Payer: Self-pay | Admitting: *Deleted

## 2015-05-01 ENCOUNTER — Encounter: Payer: Self-pay | Admitting: Radiation Oncology

## 2015-05-01 VITALS — BP 161/80 | HR 83 | Resp 16 | Wt 238.4 lb

## 2015-05-01 DIAGNOSIS — Z510 Encounter for antineoplastic radiation therapy: Secondary | ICD-10-CM | POA: Diagnosis not present

## 2015-05-01 DIAGNOSIS — C61 Malignant neoplasm of prostate: Secondary | ICD-10-CM

## 2015-05-01 NOTE — Progress Notes (Signed)
Weight stable. BP elevated. Patient reports having a headache today. Reports nocturia x 1. Reports low abdominal burning sensation when he voids. Denies hematuria. Denies diarrhea. Denies incontinence. Denies urgency. Reports mild fatigue.    BP 161/80 mmHg  Pulse 83  Resp 16  Wt 238 lb 6.4 oz (108.138 kg) Wt Readings from Last 3 Encounters:  05/01/15 238 lb 6.4 oz (108.138 kg)  04/24/15 235 lb 12.8 oz (106.958 kg)  04/17/15 236 lb 6.4 oz (107.23 kg)

## 2015-05-01 NOTE — Telephone Encounter (Signed)
Called patient to ask question to ask question, lvm for a return call

## 2015-05-01 NOTE — Progress Notes (Signed)
  Radiation Oncology         [phone]     Name: George Franco MRN: [id number]   Date: 05/01/2015  DOB: 1956-06-01   Weekly Radiation Therapy Management    ICD-9-CM ICD-10-CM   1. Malignant neoplasm of prostate 185 C61    Malignant neoplasm of prostate  Current Dose: 34.2 Gy  Planned Dose:  45 Gy + seed implant boost  Narrative The patient presents for routine under treatment assessment. 19/25 txts completed. Patient reports having a headache today. Reports nocturia x 1. Reports low abdominal burning sensation when he voids. Denies hematuria, incontinence, urgency, or diarrhea. He reports mild fatigue.  The chart was checked.  Physical Findings  weight is 238 lb 6.4 oz (108.138 kg). His blood pressure is 161/80 and his pulse is 83. His respiration is 16.  The patient's weight is currently essentially stable.There is no significant changes to the status of the paients overall health to be noted at this time. The patient is alert and oriented x3. Elevated blood pressure today.   Impression George Franco is a 59 year old gentleman presenting to clinic in regards to his malignant neoplasm of prostate. The patient is tolerating radiation.  Plan The patient is advised to continue treatment as planned.  He'll follow with his PCP for BP management   This document serves as a record of services personally performed by [name], MD. It was created on his behalf by Darcus Austin, a trained medical scribe. The creation of this record is based on the scribe's personal observations and the provider's statements to them. This document has been checked and approved by the attending provider.    ___________________________________________   Sheral Apley. Tammi Klippel, M.D.

## 2015-05-04 ENCOUNTER — Ambulatory Visit
Admission: RE | Admit: 2015-05-04 | Discharge: 2015-05-04 | Disposition: A | Discharge status: Home / Self Care | Payer: Managed Care, Other (non HMO) | Source: Ambulatory Visit | Attending: Radiation Oncology | Admitting: Radiation Oncology

## 2015-05-04 DIAGNOSIS — Z510 Encounter for antineoplastic radiation therapy: Secondary | ICD-10-CM | POA: Diagnosis not present

## 2015-05-05 ENCOUNTER — Encounter (HOSPITAL_BASED_OUTPATIENT_CLINIC_OR_DEPARTMENT_OTHER)
Admission: RE | Admit: 2015-05-05 | Discharge: 2015-05-05 | Disposition: A | Discharge status: Home / Self Care | Payer: Managed Care, Other (non HMO) | Source: Ambulatory Visit | Attending: Urology | Admitting: Urology

## 2015-05-05 ENCOUNTER — Ambulatory Visit (HOSPITAL_COMMUNITY)
Admission: RE | Admit: 2015-05-05 | Discharge: 2015-05-05 | Disposition: A | Discharge status: Home / Self Care | Payer: Managed Care, Other (non HMO) | Source: Ambulatory Visit | Attending: Urology | Admitting: Urology

## 2015-05-05 ENCOUNTER — Ambulatory Visit
Admission: RE | Admit: 2015-05-05 | Discharge: 2015-05-05 | Disposition: A | Discharge status: Home / Self Care | Payer: Managed Care, Other (non HMO) | Source: Ambulatory Visit | Attending: Radiation Oncology | Admitting: Radiation Oncology

## 2015-05-05 DIAGNOSIS — N401 Enlarged prostate with lower urinary tract symptoms: Secondary | ICD-10-CM | POA: Diagnosis not present

## 2015-05-05 DIAGNOSIS — N521 Erectile dysfunction due to diseases classified elsewhere: Secondary | ICD-10-CM | POA: Diagnosis not present

## 2015-05-05 DIAGNOSIS — Z87891 Personal history of nicotine dependence: Secondary | ICD-10-CM | POA: Insufficient documentation

## 2015-05-05 DIAGNOSIS — C61 Malignant neoplasm of prostate: Secondary | ICD-10-CM

## 2015-05-05 DIAGNOSIS — I517 Cardiomegaly: Secondary | ICD-10-CM | POA: Insufficient documentation

## 2015-05-05 DIAGNOSIS — Z79899 Other long term (current) drug therapy: Secondary | ICD-10-CM | POA: Diagnosis not present

## 2015-05-05 DIAGNOSIS — Z6833 Body mass index (BMI) 33.0-33.9, adult: Secondary | ICD-10-CM | POA: Diagnosis not present

## 2015-05-05 DIAGNOSIS — E291 Testicular hypofunction: Secondary | ICD-10-CM | POA: Diagnosis not present

## 2015-05-05 DIAGNOSIS — R9431 Abnormal electrocardiogram [ECG] [EKG]: Secondary | ICD-10-CM

## 2015-05-05 DIAGNOSIS — H409 Unspecified glaucoma: Secondary | ICD-10-CM | POA: Diagnosis not present

## 2015-05-05 DIAGNOSIS — Z794 Long term (current) use of insulin: Secondary | ICD-10-CM | POA: Diagnosis not present

## 2015-05-05 DIAGNOSIS — I10 Essential (primary) hypertension: Secondary | ICD-10-CM | POA: Diagnosis not present

## 2015-05-05 DIAGNOSIS — Z510 Encounter for antineoplastic radiation therapy: Secondary | ICD-10-CM | POA: Diagnosis not present

## 2015-05-05 DIAGNOSIS — G4730 Sleep apnea, unspecified: Secondary | ICD-10-CM | POA: Diagnosis not present

## 2015-05-05 DIAGNOSIS — R351 Nocturia: Secondary | ICD-10-CM | POA: Diagnosis not present

## 2015-05-05 DIAGNOSIS — E119 Type 2 diabetes mellitus without complications: Secondary | ICD-10-CM

## 2015-05-06 ENCOUNTER — Ambulatory Visit
Admission: RE | Admit: 2015-05-06 | Discharge: 2015-05-06 | Disposition: A | Discharge status: Home / Self Care | Payer: Managed Care, Other (non HMO) | Source: Ambulatory Visit | Attending: Radiation Oncology | Admitting: Radiation Oncology

## 2015-05-06 DIAGNOSIS — Z510 Encounter for antineoplastic radiation therapy: Secondary | ICD-10-CM | POA: Diagnosis not present

## 2015-05-07 ENCOUNTER — Ambulatory Visit
Admission: RE | Admit: 2015-05-07 | Discharge: 2015-05-07 | Disposition: A | Discharge status: Home / Self Care | Payer: Managed Care, Other (non HMO) | Source: Ambulatory Visit | Attending: Radiation Oncology | Admitting: Radiation Oncology

## 2015-05-07 DIAGNOSIS — Z510 Encounter for antineoplastic radiation therapy: Secondary | ICD-10-CM | POA: Diagnosis not present

## 2015-05-08 ENCOUNTER — Ambulatory Visit
Admission: RE | Admit: 2015-05-08 | Discharge: 2015-05-08 | Disposition: A | Discharge status: Home / Self Care | Payer: Managed Care, Other (non HMO) | Source: Ambulatory Visit | Attending: Radiation Oncology | Admitting: Radiation Oncology

## 2015-05-08 ENCOUNTER — Encounter: Payer: Self-pay | Admitting: Radiation Oncology

## 2015-05-08 VITALS — BP 158/74 | HR 86 | Resp 16 | Wt 235.5 lb

## 2015-05-08 DIAGNOSIS — C61 Malignant neoplasm of prostate: Secondary | ICD-10-CM

## 2015-05-08 DIAGNOSIS — Z510 Encounter for antineoplastic radiation therapy: Secondary | ICD-10-CM | POA: Diagnosis not present

## 2015-05-08 NOTE — Progress Notes (Signed)
Weight and vitals stable. Denies dysuria or hematuria. Reports nocturia x 2. Denies diarrhea. Reports urinary frequency. Denies incontinence or leakage. Reports occasional urgency. Reports fatigue.  BP 158/74 mmHg  Pulse 86  Resp 16  Wt 235 lb 8 oz (106.822 kg) Wt Readings from Last 3 Encounters:  05/08/15 235 lb 8 oz (106.822 kg)  05/01/15 238 lb 6.4 oz (108.138 kg)  04/24/15 235 lb 12.8 oz (106.958 kg)

## 2015-05-08 NOTE — Progress Notes (Signed)
  Radiation Oncology         [phone]     Name: George Franco MRN: [id number]   Date: 05/08/2015  DOB: 17-Feb-1956   Weekly Radiation Therapy Management    ICD-9-CM ICD-10-CM   1. Malignant neoplasm of prostate ([place]) 185 C61    Malignant neoplasm of prostate  Current Dose: 43.2 Gy  Planned Dose:  45 Gy + seed implant boost  Narrative The patient presents for routine under treatment assessment. He has completed 24 treatments.   He currently denies symptoms of dysuria, hematuria, diarrhea, incontinence or leakage. He eports nocturia x 2, urinary frequency, occasional urgency and fatigue.  The chart was checked. Scans were reviewed. The patient projected a healthy mental status and was not accompanied by family for today's radiation oncology appointment. He is excited that Monday is his last day of treatment!   Physical Findings  weight is 235 lb 8 oz (106.822 kg). His blood pressure is 158/74 and his pulse is 86. His respiration is 16.  The patient's weight is currently essentially stable. There is no significant changes to the status of overall health to be noted at this time. He is alert and oriented x3.   Impression George Franco is a 59 year old gentleman presenting to clinic in regards to his malignant neoplasm of prostate. The patient is tolerating radiation.  He is managing reported symptoms appropriately. He understands that his seed-implant procedure cannot occur until radiation therapy treatments are completed. The patient understands that he can access his appointments and medical records via [place].   Plan The patient is advised to continue treatment as planned. Healthy methods of management in regards to reported symptoms were reviewed in detail. He is advised of his follow-up appointment with radiation oncology to take place in one week's time, as scheduled. The seed-implant procedure will take place as scheduled. Common symptoms to expect post procedure and the appropriate  time frame in which they will be alleviated were reviewed in detail.  All vocalized questions and concerns have been addressed. If the patient develops any further questions or concerns in regards to his treatment and recovery, he has been encouraged to contact Dr. Tammi Klippel, MD.     This document serves as a record of services personally performed by [name], MD. It was created on his behalf by Lenn Cal, a trained medical scribe. The creation of this record is based on the scribe's personal observations and the provider's statements to them. This document has been checked and approved by the attending provider.   ___________________________________________   Sheral Apley. Tammi Klippel, M.D.

## 2015-05-11 ENCOUNTER — Encounter: Payer: Self-pay | Admitting: Radiation Oncology

## 2015-05-11 ENCOUNTER — Ambulatory Visit
Admission: RE | Admit: 2015-05-11 | Discharge: 2015-05-11 | Disposition: A | Discharge status: Home / Self Care | Payer: Managed Care, Other (non HMO) | Source: Ambulatory Visit | Attending: Radiation Oncology | Admitting: Radiation Oncology

## 2015-05-13 NOTE — Progress Notes (Signed)
  Radiation Oncology         [phone] ________________________________  Name: George Franco MRN: [id number]  Date: 05/11/2015  DOB: [birth year]  End of Treatment Note      ICD-9-CM ICD-10-CM   1. Malignant neoplasm of prostate 185 C61     DIAGNOSIS: 59 yo with stage T1c adenocarcinoma of the prostate with a Gleason's score of 4+3 and a PSA of 4.48     Indication for treatment:  Definitive radiotherapy to be followed by seed implant boost       Radiation treatment dates:   04/06/2015-05/11/2015  Site/dose: The prostate was treated to 45 Gy in 25 fractions of 1.8 Gy  Beams/energy: The prostate was treated using 3D conformal radiotherapy with a 4-field technique 15 megavolt photons. Image guidance was performed with megavoltage CT studies prior to each fraction. He was immobilized with a body fix lower extremity mold.  Narrative: The patient tolerated radiation treatment relatively well.  He experienced modest urinary symptoms and fatigue.   Weight and vitals were stable. Denied dysuria or hematuria. Reported nocturia x 2. Denied diarrhea. Reported urinary frequency. Denied incontinence or leakage. Reported occasional urgency. Reported fatigue.  Plan: The patient has completed radiation treatment. The patient will return for seed implant boost. I advised him to call or return sooner if he has any questions or concerns related to his recovery or treatment. ________________________________  Sheral Apley. Tammi Klippel, M.D.

## 2015-05-28 ENCOUNTER — Telehealth: Payer: Self-pay | Admitting: *Deleted

## 2015-05-28 NOTE — Telephone Encounter (Signed)
Called patient to remind of blood work for 05-29-15 for procedure on 06-05-15, lvm for a return call

## 2015-06-01 ENCOUNTER — Encounter (HOSPITAL_BASED_OUTPATIENT_CLINIC_OR_DEPARTMENT_OTHER): Payer: Self-pay | Admitting: *Deleted

## 2015-06-01 ENCOUNTER — Telehealth: Payer: Self-pay | Admitting: *Deleted

## 2015-06-01 NOTE — Telephone Encounter (Signed)
Called patient to ask question, lvm for a return call 

## 2015-06-02 ENCOUNTER — Encounter (HOSPITAL_BASED_OUTPATIENT_CLINIC_OR_DEPARTMENT_OTHER): Payer: Self-pay | Admitting: *Deleted

## 2015-06-02 DIAGNOSIS — C61 Malignant neoplasm of prostate: Secondary | ICD-10-CM | POA: Diagnosis not present

## 2015-06-02 LAB — COMPREHENSIVE METABOLIC PANEL
ALT: 22 U/L (ref 17–63)
AST: 19 U/L (ref 15–41)
Albumin: 4.2 g/dL (ref 3.5–5.0)
Alkaline Phosphatase: 82 U/L (ref 38–126)
Anion gap: 9 (ref 5–15)
BUN: 17 mg/dL (ref 6–20)
CO2: 25 mmol/L (ref 22–32)
Calcium: 9.4 mg/dL (ref 8.9–10.3)
Chloride: 104 mmol/L (ref 101–111)
Creatinine, Ser: 0.88 mg/dL (ref 0.61–1.24)
GFR calc Af Amer: >60 mL/min (ref >60)
GFR calc non Af Amer: >60 mL/min (ref >60)
Glucose, Bld: 239 mg/dL — ABNORMAL HIGH (ref 65–99)
Potassium: 3.8 mmol/L (ref 3.5–5.1)
Sodium: 138 mmol/L (ref 135–145)
Total Bilirubin: 0.6 mg/dL (ref 0.3–1.2)
Total Protein: 7.8 g/dL (ref 6.5–8.1)

## 2015-06-02 LAB — APTT: aPTT: 27 seconds (ref 24–37)

## 2015-06-02 LAB — PROTIME-INR
INR: 0.93 (ref 0.00–1.49)
Prothrombin Time: 12.6 seconds (ref 11.6–15.2)

## 2015-06-02 LAB — CBC
HCT: 40.5 % (ref 39.0–52.0)
Hemoglobin: 13.2 g/dL (ref 13.0–17.0)
MCH: 30.5 pg (ref 26.0–34.0)
MCHC: 32.6 g/dL (ref 30.0–36.0)
MCV: 93.5 fL (ref 78.0–100.0)
Platelets: 221 10*3/uL (ref 150–400)
RBC: 4.33 MIL/uL (ref 4.22–5.81)
RDW: 13.5 % (ref 11.5–15.5)
WBC: 6 10*3/uL (ref 4.0–10.5)

## 2015-06-02 NOTE — Progress Notes (Signed)
   06/02/15 1529  OBSTRUCTIVE SLEEP APNEA  Have you ever been diagnosed with sleep apnea through a sleep study? No  Do you snore loudly (loud enough to be heard through closed doors)?  1  Do you often feel tired, fatigued, or sleepy during the daytime (such as falling asleep during driving or talking to someone)? 0  Do you have, or are you being treated for high blood pressure? 1  BMI more than 35 kg/m2? 0  Age > 50 (1-yes) 1  Neck circumference greater than:Male 16 inches or larger, Male 17inches or larger? 1  Male Gender (Yes=1) 1  Obstructive Sleep Apnea Score 5  Score 5 or greater  Results sent to PCP

## 2015-06-02 NOTE — Progress Notes (Signed)
NPO AFTER MN.  ARRIVE AT 0600.  CURRENT LAB RESULTS , CXR, AND EKG IN CHART AND EPIC.  WILL TAKE NORVASC AM DOS W/ SIPS OF WATER AND DO FLEET ENEMA.

## 2015-06-04 ENCOUNTER — Telehealth: Payer: Self-pay | Admitting: *Deleted

## 2015-06-04 NOTE — Telephone Encounter (Signed)
CALLED PATIENT TO REMIND OF PROCEDURE FOR 06-05-15, LVM FOR A RETURN CALL

## 2015-06-05 ENCOUNTER — Encounter (HOSPITAL_BASED_OUTPATIENT_CLINIC_OR_DEPARTMENT_OTHER): Payer: Self-pay | Admitting: Anesthesiology

## 2015-06-05 ENCOUNTER — Ambulatory Visit (HOSPITAL_COMMUNITY): Payer: Managed Care, Other (non HMO)

## 2015-06-05 ENCOUNTER — Ambulatory Visit (HOSPITAL_BASED_OUTPATIENT_CLINIC_OR_DEPARTMENT_OTHER)
Admission: RE | Admit: 2015-06-05 | Discharge: 2015-06-05 | Disposition: A | Discharge status: Home / Self Care | Payer: Managed Care, Other (non HMO) | Source: Ambulatory Visit | Attending: Urology | Admitting: Urology

## 2015-06-05 ENCOUNTER — Ambulatory Visit (HOSPITAL_BASED_OUTPATIENT_CLINIC_OR_DEPARTMENT_OTHER): Payer: Managed Care, Other (non HMO) | Admitting: Anesthesiology

## 2015-06-05 ENCOUNTER — Encounter (HOSPITAL_BASED_OUTPATIENT_CLINIC_OR_DEPARTMENT_OTHER)
Admission: RE | Disposition: A | Discharge status: Home / Self Care | Payer: Self-pay | Source: Ambulatory Visit | Attending: Urology

## 2015-06-05 DIAGNOSIS — Z794 Long term (current) use of insulin: Secondary | ICD-10-CM | POA: Insufficient documentation

## 2015-06-05 DIAGNOSIS — I10 Essential (primary) hypertension: Secondary | ICD-10-CM | POA: Insufficient documentation

## 2015-06-05 DIAGNOSIS — Z87891 Personal history of nicotine dependence: Secondary | ICD-10-CM | POA: Insufficient documentation

## 2015-06-05 DIAGNOSIS — H409 Unspecified glaucoma: Secondary | ICD-10-CM | POA: Insufficient documentation

## 2015-06-05 DIAGNOSIS — Z6833 Body mass index (BMI) 33.0-33.9, adult: Secondary | ICD-10-CM | POA: Insufficient documentation

## 2015-06-05 DIAGNOSIS — C61 Malignant neoplasm of prostate: Secondary | ICD-10-CM | POA: Insufficient documentation

## 2015-06-05 DIAGNOSIS — G4730 Sleep apnea, unspecified: Secondary | ICD-10-CM | POA: Insufficient documentation

## 2015-06-05 DIAGNOSIS — E119 Type 2 diabetes mellitus without complications: Secondary | ICD-10-CM | POA: Insufficient documentation

## 2015-06-05 DIAGNOSIS — Z79899 Other long term (current) drug therapy: Secondary | ICD-10-CM | POA: Insufficient documentation

## 2015-06-05 DIAGNOSIS — E291 Testicular hypofunction: Secondary | ICD-10-CM | POA: Insufficient documentation

## 2015-06-05 DIAGNOSIS — N401 Enlarged prostate with lower urinary tract symptoms: Secondary | ICD-10-CM | POA: Insufficient documentation

## 2015-06-05 DIAGNOSIS — N521 Erectile dysfunction due to diseases classified elsewhere: Secondary | ICD-10-CM | POA: Insufficient documentation

## 2015-06-05 DIAGNOSIS — R351 Nocturia: Secondary | ICD-10-CM | POA: Insufficient documentation

## 2015-06-05 HISTORY — DX: Male erectile dysfunction, unspecified: N52.9

## 2015-06-05 HISTORY — PX: RADIOACTIVE SEED IMPLANT: SHX5150

## 2015-06-05 HISTORY — DX: Other specified personal risk factors, not elsewhere classified: Z91.89

## 2015-06-05 HISTORY — DX: Unspecified symptoms and signs involving the genitourinary system: R39.9

## 2015-06-05 HISTORY — DX: Type 2 diabetes mellitus without complications: E11.9

## 2015-06-05 HISTORY — DX: Benign prostatic hyperplasia without lower urinary tract symptoms: N40.0

## 2015-06-05 HISTORY — DX: Presence of spectacles and contact lenses: Z97.3

## 2015-06-05 LAB — GLUCOSE, CAPILLARY
Glucose-Capillary: 192 mg/dL — ABNORMAL HIGH (ref 65–99)
Glucose-Capillary: 206 mg/dL — ABNORMAL HIGH (ref 65–99)

## 2015-06-05 SURGERY — INSERTION, RADIATION SOURCE, PROSTATE
Anesthesia: General | Site: Prostate

## 2015-06-05 MED ORDER — LIDOCAINE HCL (CARDIAC) 20 MG/ML IV SOLN
INTRAVENOUS | Status: DC | PRN
  Administered 2015-06-05: 100 mg via INTRAVENOUS

## 2015-06-05 MED ORDER — PHENAZOPYRIDINE HCL 100 MG PO TABS
ORAL_TABLET | ORAL | Status: AC
  Filled 2015-06-05: qty 2

## 2015-06-05 MED ORDER — FENTANYL CITRATE (PF) 100 MCG/2ML IJ SOLN
INTRAMUSCULAR | Status: DC | PRN
  Administered 2015-06-05: 25 ug via INTRAVENOUS
  Administered 2015-06-05 (×2): 50 ug via INTRAVENOUS
  Administered 2015-06-05: 25 ug via INTRAVENOUS
  Administered 2015-06-05: 50 ug via INTRAVENOUS

## 2015-06-05 MED ORDER — CIPROFLOXACIN IN D5W 400 MG/200ML IV SOLN
400.0000 mg | INTRAVENOUS | Status: AC
  Administered 2015-06-05: 400 mg via INTRAVENOUS
  Filled 2015-06-05: qty 200

## 2015-06-05 MED ORDER — OXYCODONE HCL 5 MG PO TABS
ORAL_TABLET | ORAL | Status: AC
  Filled 2015-06-05: qty 1

## 2015-06-05 MED ORDER — PHENAZOPYRIDINE HCL 200 MG PO TABS
200.0000 mg | ORAL_TABLET | Freq: Once | ORAL | Status: AC
  Administered 2015-06-05: 200 mg via ORAL
  Filled 2015-06-05: qty 1

## 2015-06-05 MED ORDER — TAMSULOSIN HCL 0.4 MG PO CAPS
0.4000 mg | ORAL_CAPSULE | Freq: Once | ORAL | Status: AC
  Administered 2015-06-05: 0.4 mg via ORAL
  Filled 2015-06-05: qty 1

## 2015-06-05 MED ORDER — ONDANSETRON HCL 4 MG/2ML IJ SOLN
INTRAMUSCULAR | Status: DC | PRN
  Administered 2015-06-05: 4 mg via INTRAVENOUS

## 2015-06-05 MED ORDER — FENTANYL CITRATE (PF) 100 MCG/2ML IJ SOLN
25.0000 ug | INTRAMUSCULAR | Status: DC | PRN
  Filled 2015-06-05: qty 1

## 2015-06-05 MED ORDER — ACETAMINOPHEN 500 MG PO TABS
ORAL_TABLET | ORAL | Status: AC
  Filled 2015-06-05: qty 2

## 2015-06-05 MED ORDER — OXYCODONE HCL 5 MG PO TABS
5.0000 mg | ORAL_TABLET | Freq: Once | ORAL | Status: AC | PRN
  Administered 2015-06-05: 5 mg via ORAL
  Filled 2015-06-05: qty 1

## 2015-06-05 MED ORDER — SUCCINYLCHOLINE 20MG/ML (10ML) SYRINGE FOR MEDFUSION PUMP - OPTIME
INTRAMUSCULAR | Status: DC | PRN
  Administered 2015-06-05: 100 mg via INTRAVENOUS

## 2015-06-05 MED ORDER — TAMSULOSIN HCL 0.4 MG PO CAPS
ORAL_CAPSULE | ORAL | Status: AC
  Filled 2015-06-05: qty 1

## 2015-06-05 MED ORDER — PHENYLEPHRINE HCL 10 MG/ML IJ SOLN
INTRAMUSCULAR | Status: DC | PRN
  Administered 2015-06-05 (×2): 40 ug via INTRAVENOUS
  Administered 2015-06-05 (×2): 80 ug via INTRAVENOUS

## 2015-06-05 MED ORDER — FENTANYL CITRATE (PF) 100 MCG/2ML IJ SOLN
INTRAMUSCULAR | Status: AC
  Filled 2015-06-05: qty 4

## 2015-06-05 MED ORDER — IOHEXOL 350 MG/ML SOLN
INTRAVENOUS | Status: DC | PRN
  Administered 2015-06-05: 7 mL

## 2015-06-05 MED ORDER — OXYCODONE HCL 5 MG/5ML PO SOLN
5.0000 mg | Freq: Once | ORAL | Status: AC | PRN
  Filled 2015-06-05: qty 5

## 2015-06-05 MED ORDER — CIPROFLOXACIN IN D5W 400 MG/200ML IV SOLN
INTRAVENOUS | Status: AC
  Filled 2015-06-05: qty 200

## 2015-06-05 MED ORDER — HYDROCODONE-ACETAMINOPHEN 10-325 MG PO TABS: 1.0000 | ORAL_TABLET | ORAL | Status: DC | PRN

## 2015-06-05 MED ORDER — LACTATED RINGERS IV SOLN
INTRAVENOUS | Status: DC
  Administered 2015-06-05 (×2): via INTRAVENOUS
  Filled 2015-06-05: qty 1000

## 2015-06-05 MED ORDER — ACETAMINOPHEN 500 MG PO TABS
1000.0000 mg | ORAL_TABLET | Freq: Once | ORAL | Status: AC
  Administered 2015-06-05: 1000 mg via ORAL
  Filled 2015-06-05: qty 2

## 2015-06-05 MED ORDER — STERILE WATER FOR IRRIGATION IR SOLN
Status: DC | PRN
  Administered 2015-06-05: 3000 mL via INTRAVESICAL

## 2015-06-05 MED ORDER — KETOROLAC TROMETHAMINE 30 MG/ML IJ SOLN
INTRAMUSCULAR | Status: DC | PRN
  Administered 2015-06-05: 30 mg via INTRAVENOUS

## 2015-06-05 MED ORDER — MIDAZOLAM HCL 2 MG/2ML IJ SOLN
INTRAMUSCULAR | Status: AC
  Filled 2015-06-05: qty 2

## 2015-06-05 MED ORDER — STERILE WATER FOR IRRIGATION IR SOLN
Status: DC | PRN
  Administered 2015-06-05: 500 mL

## 2015-06-05 MED ORDER — FLEET ENEMA 7-19 GM/118ML RE ENEM
1.0000 | ENEMA | Freq: Once | RECTAL | Status: AC
  Administered 2015-06-05: 1 via RECTAL
  Filled 2015-06-05: qty 1

## 2015-06-05 MED ORDER — CIPROFLOXACIN HCL 500 MG PO TABS: 500.0000 mg | ORAL_TABLET | Freq: Two times a day (BID) | ORAL | Status: DC

## 2015-06-05 MED ORDER — PROPOFOL 10 MG/ML IV BOLUS
INTRAVENOUS | Status: DC | PRN
  Administered 2015-06-05: 250 mg via INTRAVENOUS
  Administered 2015-06-05: 100 mg via INTRAVENOUS
  Administered 2015-06-05: 150 mg via INTRAVENOUS
  Administered 2015-06-05 (×2): 50 mg via INTRAVENOUS
  Administered 2015-06-05: 200 mg via INTRAVENOUS
  Administered 2015-06-05: 100 mg via INTRAVENOUS

## 2015-06-05 MED ORDER — MIDAZOLAM HCL 5 MG/5ML IJ SOLN
INTRAMUSCULAR | Status: DC | PRN
  Administered 2015-06-05: 2 mg via INTRAVENOUS

## 2015-06-05 SURGICAL SUPPLY — 28 items
BAG URINE DRAINAGE (UROLOGICAL SUPPLIES) ×2 IMPLANT
BLADE CLIPPER SURG (BLADE) ×2 IMPLANT
CATH FOLEY 2WAY SLVR  5CC 16FR (CATHETERS) ×2
CATH FOLEY 2WAY SLVR 5CC 16FR (CATHETERS) ×2 IMPLANT
CATH ROBINSON RED A/P 20FR (CATHETERS) ×2 IMPLANT
CLOTH BEACON ORANGE TIMEOUT ST (SAFETY) ×2 IMPLANT
COVER BACK TABLE 60X90IN (DRAPES) ×2 IMPLANT
COVER MAYO STAND STRL (DRAPES) ×2 IMPLANT
DRSG TEGADERM 4X4.75 (GAUZE/BANDAGES/DRESSINGS) ×2 IMPLANT
DRSG TEGADERM 8X12 (GAUZE/BANDAGES/DRESSINGS) ×2 IMPLANT
GLOVE BIO SURGEON STRL SZ7.5 (GLOVE) IMPLANT
GLOVE BIO SURGEON STRL SZ8 (GLOVE) ×4 IMPLANT
GLOVE ECLIPSE 8.0 STRL XLNG CF (GLOVE) IMPLANT
GOWN STRL REUS W/ TWL XL LVL3 (GOWN DISPOSABLE) ×1 IMPLANT
GOWN STRL REUS W/TWL XL LVL3 (GOWN DISPOSABLE) ×2
GOWN XL W/COTTON TOWEL STD (GOWNS) ×2 IMPLANT
HOLDER FOLEY CATH W/STRAP (MISCELLANEOUS) ×2 IMPLANT
IV NS IRRIG 3000ML ARTHROMATIC (IV SOLUTION) IMPLANT
KIT ROOM TURNOVER WOR (KITS) ×2 IMPLANT
MANIFOLD NEPTUNE II (INSTRUMENTS) IMPLANT
Nucletron selectSeed 1-125 ×56 IMPLANT
PACK CYSTO (CUSTOM PROCEDURE TRAY) ×2 IMPLANT
SPONGE GAUZE 4X4 12PLY STER LF (GAUZE/BANDAGES/DRESSINGS) ×1 IMPLANT
SYRINGE 10CC LL (SYRINGE) ×2 IMPLANT
TUBE CONNECTING 12X1/4 (SUCTIONS) IMPLANT
UNDERPAD 30X30 INCONTINENT (UNDERPADS AND DIAPERS) ×4 IMPLANT
WATER STERILE IRR 3000ML UROMA (IV SOLUTION) ×1 IMPLANT
WATER STERILE IRR 500ML POUR (IV SOLUTION) ×2 IMPLANT

## 2015-06-05 NOTE — Transfer of Care (Signed)
Immediate Anesthesia Transfer of Care Note  Patient: George Franco  Procedure(s) Performed: Procedure(s): RADIOACTIVE SEED IMPLANT/BRACHYTHERAPY IMPLANT (N/A)  Patient Location: PACU  Anesthesia Type:General  Level of Consciousness: awake and oriented  Airway & Oxygen Therapy: Patient Spontanous Breathing and Patient connected to face mask oxygen  Post-op Assessment: Report given to RN  Post vital signs: Reviewed and stable  Last Vitals:  Filed Vitals:   06/05/15 0634  BP: 178/83  Pulse: 99  Temp: 37.2 C  Resp: 18    Complications: No apparent anesthesia complications

## 2015-06-05 NOTE — Discharge Instructions (Signed)
DISCHARGE INSTRUCTIONS FOR PROSTATE SEED IMPLANTATION  Removal of catheter Remove the foley catheter after 24 hours if one is left in after the procedure ( day after the procedure).can be done easily by cutting the side port of the catheter, whichallow the balloon to deflate.  You will see 1-2 teaspoons of clear water as the balloon deflates and then the catheter can be slid out without difficulty.        Cut here  Antibiotics You may be given a prescription for an antibiotic to take when you arrive home. If so, be sure to take every tablet in the bottle, even if you are feeling better before the prescription is finished. If you begin itching, notice a rash or start to swell on your trunk, arms, legs and/or throat, immediately stop taking the antibiotic and call your Urologist. Diet Resume your usual diet when you return home. To keep your bowels moving easily and softly, drink prune, apple and cranberry juice at room temperature. You may also take a stool softener, such as Colace, which is available without prescription at local pharmacies. Daily activities ? No driving or heavy lifting for at least two days after the implant. ? No bike riding, horseback riding or riding lawn mowers for the first month after the implant. ? Any strenuous physical activity should be approved by your doctor before you resume it. Sexual relations You may resume sexual relations two weeks after the procedure. A condom should be used for the first two weeks. Your semen may be dark brown or black; this is normal and is related bleeding that may have occurred during the implant. Postoperative swelling Expect swelling and bruising of the scrotum and perineum (the area between the scrotum and anus). Both the swelling and the bruising should resolve in l or 2 weeks. Ice packs and over- the-counter medications such as Tylenol, Advil or Aleve may lessen your discomfort. Postoperative urination Most men experience burning on  urination and/or urinary frequency. If this becomes bothersome, contact your Urologist.  Medication can be prescribed to relieve these problems.  It is normal to have some blood in your urine for a few days after the implant. Special instructions related to the seeds It is unlikely that you will pass an Iodine-125 seed in your urine. The seeds are silver in color and are about as large as a grain of rice. If you pass a seed, do not handle it with your fingers. Use a spoon to place it in an envelope or jar in place this in base occluded area such as the garage or basement for return to the radiation clinic at your convenience.  Contact your doctor for ? Temperature greater than 101 F ? Increasing pain ? Inability to urinate Follow-up  You should have follow up with your urologist and radiation oncologist about 3 weeks after the procedure. General information regarding Iodine seeds ? Iodine-125 is a low energy radioactive material. It is not deeply penetrating and loses energy at short distances. Your prostate will absorb the radiation. Objects that are touched or used by the patient do not become radioactive. ? Body wastes (urine and stool) or body fluids (saliva, tears, semen or blood) are not radioactive. ? The Nuclear Regulatory Commission [facility]) has determined that no radiation precautions are needed for patients undergoing Iodine-125 seed implantation. The [facility] states that such patients do not present a risk to the people around them, including young children and pregnant women. However, in keeping with the general principle that  radiation exposure should be kept as low reasonably possible, we suggest the following: ? Children and pets should not sit on the patient's lap for the first two (2) weeks after the implant. ? Pregnant (or possibly pregnant) women should avoid prolonged, close contact with the patient for the first two (2) weeks after the implant. ? A distance of three (3) feet is  acceptable. ? At a distance of three (3) feet, there is no limit to the length of time anyone can be with the patient.    Post Anesthesia Home Care Instructions  Activity: Get plenty of rest for the remainder of the day. A responsible adult should stay with you for 24 hours following the procedure.  For the next 24 hours, DO NOT: -Drive a car -Paediatric nurse -Drink alcoholic beverages -Take any medication unless instructed by your physician -Make any legal decisions or sign important papers.  Meals: Start with liquid foods such as gelatin or soup. Progress to regular foods as tolerated. Avoid greasy, spicy, heavy foods. If nausea and/or vomiting occur, drink only clear liquids until the nausea and/or vomiting subsides. Call your physician if vomiting continues.  Special Instructions/Symptoms: Your throat may feel dry or sore from the anesthesia or the breathing tube placed in your throat during surgery. If this causes discomfort, gargle with warm salt water. The discomfort should disappear within 24 hours.  If you had a scopolamine patch placed behind your ear for the management of post- operative nausea and/or vomiting:  1. The medication in the patch is effective for 72 hours, after which it should be removed.  Wrap patch in a tissue and discard in the trash. Wash hands thoroughly with soap and water. 2. You may remove the patch earlier than 72 hours if you experience unpleasant side effects which may include dry mouth, dizziness or visual disturbances. 3. Avoid touching the patch. Wash your hands with soap and water after contact with the patch.

## 2015-06-05 NOTE — Anesthesia Preprocedure Evaluation (Signed)
Anesthesia Evaluation  Patient identified by MRN, date of birth, ID band Patient awake    Reviewed: Allergy & Precautions, NPO status , Patient's Chart, lab work & pertinent test results  History of Anesthesia Complications Negative for: history of anesthetic complications  Airway Mallampati: III  TM Distance: >3 FB Neck ROM: Full    Dental  (+) Teeth Intact, Missing   Pulmonary neg shortness of breath, neg sleep apnea, neg COPD, neg recent URI, former smoker, neg PE   breath sounds clear to auscultation       Cardiovascular hypertension, Pt. on medications (-) angina(-) Past MI and (-) CHF (-) dysrhythmias  Rhythm:Regular     Neuro/Psych negative neurological ROS  negative psych ROS   GI/Hepatic negative GI ROS, Neg liver ROS,   Endo/Other  diabetes, Type 2, Insulin DependentMorbid obesity  Renal/GU negative Renal ROS     Musculoskeletal   Abdominal   Peds  Hematology negative hematology ROS (+)   Anesthesia Other Findings   Reproductive/Obstetrics                             Anesthesia Physical Anesthesia Plan  ASA: II  Anesthesia Plan: General   Post-op Pain Management:    Induction: Intravenous  Airway Management Planned: LMA and Oral ETT  Additional Equipment: None  Intra-op Plan:   Post-operative Plan: Extubation in OR  Informed Consent: I have reviewed the patients History and Physical, chart, labs and discussed the procedure including the risks, benefits and alternatives for the proposed anesthesia with the patient or authorized representative who has indicated his/her understanding and acceptance.   Dental advisory given  Plan Discussed with: CRNA and Surgeon  Anesthesia Plan Comments:         Anesthesia Quick Evaluation

## 2015-06-05 NOTE — Anesthesia Procedure Notes (Addendum)
Procedure Name: LMA Insertion Date/Time: 06/05/2015 7:43 AM Performed by: Bethena Roys T Pre-anesthesia Checklist: Patient identified, Emergency Drugs available, Suction available and Patient being monitored Patient Re-evaluated:Patient Re-evaluated prior to inductionOxygen Delivery Method: Circle System Utilized Preoxygenation: Pre-oxygenation with 100% oxygen Intubation Type: IV induction Ventilation: Mask ventilation without difficulty LMA: LMA with gastric port inserted LMA Size: 5.0 Number of attempts: 1 Placement Confirmation: positive ETCO2 Tube secured with: Tape Dental Injury: Teeth and Oropharynx as per pre-operative assessment    Procedure Name: Intubation Date/Time: 06/05/2015 8:00 AM Performed by: Bethena Roys T Pre-anesthesia Checklist: Patient identified, Emergency Drugs available, Suction available and Patient being monitored Patient Re-evaluated:Patient Re-evaluated prior to inductionOxygen Delivery Method: Circle System Utilized Preoxygenation: Pre-oxygenation with 100% oxygen Intubation Type: IV induction Ventilation: Mask ventilation without difficulty Laryngoscope Size: Mac and 4 Grade View: Grade II Tube type: Oral Number of attempts: 1 Airway Equipment and Method: Stylet and Oral airway Placement Confirmation: ETT inserted through vocal cords under direct vision,  positive ETCO2 and breath sounds checked- equal and bilateral Secured at: 22 cm Tube secured with: Tape Dental Injury: Teeth and Oropharynx as per pre-operative assessment  Comments: Removed LMA, suctioned well.  Easy mask and intubation.

## 2015-06-05 NOTE — H&P (Signed)
History of Present Illness George Franco is a 59 year old male with prostate cancer.        Elevated PSA: His PSA was noted to have increased from 2.12 in 1/13 to 3.0 in 6/13. His testosterone replacement therapy was held due to this finding. There is no family history of prostate cancer. His PSA in 8/13 was 2.12.  TRUS/BX 11/27/14: PSA - 4.06, DRE - normal, prostate volume - 33 cc  Pathology: Adenocarcinoma 2/12 cores positive (Gleason 4+3 = 7, 10% right apex lateral and 3+3 = 6, 5% left apex lateral)  Stage: T1c     Hypogonadism: He has been managed with testosterone replacement therapy with his testosterone in the normal range. His testosterone in 6/13 was 549 with a free testosterone of 17.3. He originally was using AndroGel but tended to forget to use it so he went to injection therapy every 3 weeks. He is currently off all testosterone replacement.     BPH without obstruction: His voiding symptoms are very mild. He says he gets up at night but otherwise is not bothered by urinary symptoms.     Microscopic hematuria: He underwent a full evaluation with CT scan and cystoscopy in 10/13 with no abnormality noted.    Organic erectile dysfunction: He has had difficulty achieving and maintaining his erections. He said he has tried phosphodiesterase inhibitor therapy in the past.  Current therapy: George 20 mg    Interval history: He presents for I-125 seed implantation  Past Medical History Problems  1. History of diabetes mellitus (Z86.39) 2. History of glaucoma (Z86.69) 3. History of hypertension (Z86.79) 4. History of sleep apnea (Z87.09)  Surgical History Problems  1. History of Biopsy Of The Prostate Needle 2. History of Rotator Cuff Repair  Current Meds 1. Benazepril HCl - 40 MG Oral Franco;  Therapy: [id number] to Recorded 2. George Franco; TAKE 1 Franco As Directed;  Therapy: 24Oct2013 to (Last Rx:24Oct2013) Ordered 3. George Franco;  TAKE ONE Franco BY MOUTH DAILY AS NEEDED;  Therapy: [id number] to (Last Rx:25Sep2015)  Requested for: 25Sep2015 Ordered 4. George Franco;  Therapy: (Recorded:10Oct2013) to Recorded 5. George Mix 50/50 (50-50) 100 UNIT/ML Subcutaneous Suspension;  Therapy: 22Dec2013 to Recorded 6. Jardiance 25 MG Oral Franco;  Therapy: (Recorded:25Sep2015) to Recorded 7. Levofloxacin 500 MG Oral Franco; 1 Franco the day before the procedure, 1 Franco the day of  the procedure, and 1 Franco the day after the procedure;  Therapy: 08Apr2016 to (Last Rx:08Apr2016)  Requested for: 08Apr2016 Ordered  Allergies Medication  1. No Known Drug Allergies  Family History Problems  1. Family history of Diabetes Mellitus  Social History Problems  1. Being A Social Drinker 2. Caffeine Use 3. Former smoker [phone])   less than a pack a day 20 years plus 4. Marital History - Currently Married   Review of Systems Genitourinary, constitutional, skin, eye, otolaryngeal, hematologic/lymphatic, cardiovascular, pulmonary, endocrine, musculoskeletal, gastrointestinal, neurological and psychiatric system(s) were reviewed and pertinent findings if present are noted.  Genitourinary: nocturia and erectile dysfunction.  Gastrointestinal: constipation.    Vitals Vital Signs  BMI Calculated: 34.96 BSA Calculated: 2.21 Height: 5 ft 9 in Weight: 236 lb  Blood Pressure: 184 / 94 Heart Rate: 104  Physical Exam Constitutional: Well nourished and well developed . No acute distress.  ENT:. The ears and nose are normal in appearance.  Neck: The appearance of the neck is normal and no neck mass is present.  Pulmonary: No respiratory  distress and normal respiratory rhythm and effort.  Cardiovascular: Heart rate and rhythm are normal . No peripheral edema.  Abdomen: The abdomen is soft and nontender. No masses are palpated. No CVA tenderness. No hernias are palpable. No hepatosplenomegaly noted.   Rectal: Rectal exam demonstrates normal sphincter tone, no tenderness and no masses. Estimated prostate size is 1+. The prostate has no nodularity and is not tender. The left seminal vesicle is nonpalpable. The right seminal vesicle is nonpalpable. The perineum is normal on inspection.  Genitourinary: Examination of the penis demonstrates no discharge, no masses, no lesions and a normal meatus. The penis is circumcised. The scrotum is without lesions. The right epididymis is palpably normal and non-tender. The left epididymis is palpably normal and non-tender. The right testis is non-tender and without masses. The left testis is non-tender and without masses.  Lymphatics: The femoral and inguinal nodes are not enlarged or tender.  Skin: Normal skin turgor, no visible rash and no visible skin lesions.  Neuro/Psych:. Mood and affect are appropriate.    Results/Data Partin table results: The probability of organ confined disease is 47% with a 50% probability of extracapsular extension, a 5% probability of seminal vesicle involvement and a 5% probability of lymph node involvement.     Impression:  The patient was counseled about the natural history of prostate cancer and the standard treatment options that are available for prostate cancer. It was explained to him how his age and life expectancy, clinical stage, Gleason score, and PSA affect his prognosis, the decision to proceed with additional staging studies, as well as how that information influences recommended treatment strategies. We discussed the roles for active surveillance, radiation therapy, surgical therapy, androgen deprivation, as well as ablative therapy options for the treatment of prostate cancer as appropriate to his individual cancer situation. We discussed the risks and benefits of these options with regard to their impact on cancer control and also in terms of potential adverse events, complications, and impact on quiality of life  particularly related to urinary, bowel, and sexual function. The patient was encouraged to ask questions throughout the discussion today and all questions were answered to his stated satisfaction. In addition, the patient was provided with and/or directed to appropriate resources and literature for further education about prostate cancer and treatment options. He and his wife both had many well thought out questions. I have answered those completely for them. At this point it seems he is primarily interested in radiation therapy. He is most interested in radioactive seeds. I told him he would also require some external beam radiation and possible ADT.   Plan: I-125 seed implantation

## 2015-06-05 NOTE — Op Note (Signed)
PATIENT:  George Franco  PRE-OPERATIVE DIAGNOSIS:  Adenocarcinoma of the prostate  POST-OPERATIVE DIAGNOSIS:  Same  PROCEDURE:  Procedure(s): 1. I-125 radioactive seed implantation 2. Cystoscopy  SURGEON:  Surgeon(s): Claybon Jabs  Radiation oncologist: Dr. [name]  ANESTHESIA:  General  EBL:  Minimal  DRAINS: 71 French Foley catheter  INDICATION: George Franco  Description of procedure: After informed consent the patient was brought to the major OR, placed on the table and administered general anesthesia. He was then moved to the modified lithotomy position with his perineum perpendicular to the floor. His perineum and genitalia were then sterilely prepped. An official timeout was then performed. A 16 French Foley catheter was then placed in the bladder and filled with dilute contrast, a rectal tube was placed in the rectum and the transrectal ultrasound probe was placed in the rectum and affixed to the stand. He was then sterilely draped.  Real time ultrasonography was used along with the seed planning software [employer] Prostate vs. 4.2.21. This was used to develop the seed plan including the number of needles as well as number of seeds required for complete and adequate coverage. Real-time ultrasonography was then used along with the previously developed plan and the Nucletron device to implant a total of 56 seeds using 23 needles. This proceeded without difficulty or complication.  A Foley catheter was then removed as well as the transrectal ultrasound probe and rectal probe. Flexible cystoscopy was then performed using the 17 French flexible scope which revealed a normal urethra throughout its length down to the sphincter which appeared intact. The prostatic urethra revealed bilobar hypertrophy but no evidence of obstruction, seeds, spacers or lesions. The bladder was then entered and fully and systematically inspected. The ureteral orifices were noted to be of normal  configuration and position. The mucosa revealed no evidence of tumors. There were also no stones identified within the bladder. I noted no seeds or spacers on the floor of the bladder and retroflexion of the scope revealed no seeds protruding from the base of the prostate.  The cystoscope was then removed and the patient was awakened and taken to recovery room in stable and satisfactory condition. He tolerated procedure well and there were no intraoperative complications.

## 2015-06-05 NOTE — Anesthesia Postprocedure Evaluation (Signed)
  Anesthesia Post-op Note  Patient: George Franco  Procedure(s) Performed: Procedure(s): RADIOACTIVE SEED IMPLANT/BRACHYTHERAPY IMPLANT (N/A)  Patient Location: PACU  Anesthesia Type:General  Level of Consciousness: awake  Airway and Oxygen Therapy: Patient Spontanous Breathing  Post-op Pain: mild  Post-op Assessment: Post-op Vital signs reviewed, Patient's Cardiovascular Status Stable, Respiratory Function Stable, Patent Airway, No signs of Nausea or vomiting and Pain level controlled              Post-op Vital Signs: Reviewed and stable  Last Vitals:  Filed Vitals:   06/05/15 1030  BP: 185/77  Pulse: 88  Temp:   Resp: 19    Complications: No apparent anesthesia complications

## 2015-06-07 NOTE — Progress Notes (Signed)
  Radiation Oncology         [phone] ________________________________  Name: Rc Amison MRN: [id number]  Date: 06/07/2015  DOB: [birth year]       Prostate Seed Implant  KM:QKMMN,OTRRNHA Antony Haste, MD  No ref. provider found  DIAGNOSIS: 59 yo with stage T1c adenocarcinoma of the prostate with a Gleason's score of 4+3 and a PSA of 4.48    ICD-9-CM ICD-10-CM   1. Prostate cancer 83 C61 DG Chest 2 View     DG Chest 2 View     Discharge patient    PROCEDURE: Insertion of radioactive I-125 seeds into the prostate gland.  RADIATION DOSE: 110 Gy, boost therapy.  TECHNIQUE: Tynan Boesel was brought to the operating room with the urologist. He was placed in the dorsolithotomy position. He was catheterized and a rectal tube was inserted. The perineum was shaved, prepped and draped. The ultrasound probe was then introduced into the rectum to see the prostate gland.  TREATMENT DEVICE: A needle grid was attached to the ultrasound probe stand and anchor needles were placed.  3D PLANNING: The prostate was imaged in 3D using a sagittal sweep of the prostate probe. These images were transferred to the planning computer. There, the prostate, urethra and rectum were defined on each axial reconstructed image. Then, the software created an optimized 3D plan and a few seed positions were adjusted. The quality of the plan was reviewed using [facility] information for the target and the following two organs at risk:  Urethra and Rectum.  Then the accepted plan was uploaded to the seed Selectron afterloading unit.  PROSTATE VOLUME STUDY:  Using transrectal ultrasound the volume of the prostate was verified to be 26.5 cc.  SPECIAL TREATMENT PROCEDURE/SUPERVISION AND HANDLING: The Nucletron FIRST system was used to place the needles under sagittal guidance. A total of 23 needles were used to deposit 56 seeds in the prostate gland. The individual seed activity was 0.331 mCi.  COMPLEX SIMULATION: At the end of the  procedure, an anterior radiograph of the pelvis was obtained to document seed positioning and count. Cystoscopy was performed to check the urethra and bladder.  MICRODOSIMETRY: At the end of the procedure, the patient was emitting 0.09 mrem/hr at 1 meter. Accordingly, he was considered safe for hospital discharge.  PLAN: The patient will return to the radiation oncology clinic for post implant CT dosimetry in three weeks.   ________________________________  Sheral Apley Tammi Klippel, M.D.

## 2015-06-08 ENCOUNTER — Encounter (HOSPITAL_BASED_OUTPATIENT_CLINIC_OR_DEPARTMENT_OTHER): Payer: Self-pay | Admitting: Urology

## 2015-06-11 ENCOUNTER — Ambulatory Visit: Payer: Self-pay | Admitting: Radiation Oncology

## 2015-06-16 ENCOUNTER — Telehealth: Payer: Self-pay | Admitting: *Deleted

## 2015-06-16 NOTE — Telephone Encounter (Signed)
Called patient to inform his post seeds appts. Have been moved to 06-26-15, lvm for a return call

## 2015-06-25 ENCOUNTER — Telehealth: Payer: Self-pay | Admitting: *Deleted

## 2015-06-25 ENCOUNTER — Ambulatory Visit: Payer: Managed Care, Other (non HMO) | Admitting: Radiation Oncology

## 2015-06-25 ENCOUNTER — Ambulatory Visit: Payer: Self-pay | Admitting: Radiation Oncology

## 2015-06-25 NOTE — Telephone Encounter (Signed)
CALLED PATIENT TO INFORM THAT APPT. WITH DR. Karsten Ro HAS BEEN MOVED TO 06-26-15- ARRIVAL TIME - 2 PM, SPOKE WITH PATIENT AND HE IS AWARE OF THIS APPT. CHANGE AND HIE IS GOOD WITH IT.

## 2015-06-26 ENCOUNTER — Encounter: Payer: Self-pay | Admitting: Radiation Oncology

## 2015-06-26 ENCOUNTER — Ambulatory Visit
Admission: RE | Admit: 2015-06-26 | Discharge: 2015-06-26 | Disposition: A | Discharge status: Home / Self Care | Payer: Managed Care, Other (non HMO) | Source: Ambulatory Visit | Attending: Radiation Oncology | Admitting: Radiation Oncology

## 2015-06-26 VITALS — BP 165/78 | HR 81 | Temp 99.0°F | Ht 69.0 in | Wt 238.7 lb

## 2015-06-26 DIAGNOSIS — C61 Malignant neoplasm of prostate: Secondary | ICD-10-CM | POA: Diagnosis present

## 2015-06-26 DIAGNOSIS — Z510 Encounter for antineoplastic radiation therapy: Secondary | ICD-10-CM | POA: Diagnosis present

## 2015-06-26 MED ORDER — TAMSULOSIN HCL 0.4 MG PO CAPS: 0.4000 mg | ORAL_CAPSULE | Freq: Every day | ORAL | Status: DC

## 2015-06-26 NOTE — Progress Notes (Signed)
  Radiation Oncology         [phone] ________________________________  Name: George Franco MRN: [id number]  Date: 06/26/2015  DOB: 03-18-1956  COMPLEX SIMULATION NOTE  NARRATIVE:  The patient was brought to the [facility] today following prostate seed implantation approximately one month ago.  Identity was confirmed.  All relevant records and images related to the planned course of therapy were reviewed.  Then, the patient was set-up supine.  CT images were obtained.  The CT images were loaded into the planning software.  Then the prostate and rectum were contoured.  Treatment planning then occurred.  The implanted iodine 125 seeds were identified by the physics staff for projection of radiation distribution  I have requested : 3D Simulation  I have requested a DVH of the following structures: Prostate and rectum.    ________________________________  Sheral Apley Tammi Klippel, M.D.  This document serves as a record of services personally performed by George Pita, MD. It was created on his behalf by Arlyce Harman, a trained medical scribe. The creation of this record is based on the scribe's personal observations and the provider's statements to them. This document has been checked and approved by the attending provider.

## 2015-06-26 NOTE — Progress Notes (Signed)
George Franco here for reassessment s/p XRT to his pelvis for prostate cancer.  He continues to have some incomplete emptying, frequency, intermittent, weak stream, and nocturia.

## 2015-06-26 NOTE — Progress Notes (Signed)
Radiation Oncology         [phone] ________________________________  Name: George Franco MRN: [id number]  Date: 06/26/2015  DOB: 1955/11/14  Follow-Up Visit Note  CC: George Stack, MD  George Rhodes, MD  Diagnosis:   George Franco is a pleasant 59 yo with stage T1c adenocarcinoma of the prostate with a Gleason's score of 4+3 and a PSA of 4.48    ICD-9-CM ICD-10-CM   1. Malignant neoplasm of prostate (HCC) 185 C61     Interval Since Last Radiation:  3  weeks  Narrative:  The patient returns today for routine follow-up.  He is complaining of increased urinary frequency and urinary hesitation symptoms. He filled out a questionnaire regarding urinary function today providing and overall IPSS score of 17 characterizing his symptoms as moderate.  His pre-implant score was 7. He denies any bowel symptoms.  George Franco here for reassessment s/p XRT to his pelvis for prostate cancer. He continues to have some incomplete emptying, frequency, intermittent, weak stream, and nocturia. Reports intermittent drops of blood in his underwear, stating it is not a lot and not often. No change in appetite. He has not had a recent eye exam because his previous eye doctor retired. Reports dizziness every morning when he gets up; he has had scans performed to look for the cause and takes meclizine for management.    ALLERGIES:  has No Known Allergies.  Meds: Current Outpatient Prescriptions  Medication Sig Dispense Refill  . amLODipine (NORVASC) 2.5 MG tablet Take 2.5 mg by mouth every morning.     . benazepril (LOTENSIN) 40 MG tablet Take 40 mg by mouth every morning.     . empagliflozin (JARDIANCE) 25 MG TABS tablet Take 25 mg by mouth daily. Takes in am    . HUMALOG MIX 50/50 (50-50) 100 UNIT/ML SUSP injection Inject into the skin 3 (three) times daily. 40 units w/ breakfast,  30 units w/ lunch,  40 units w/ dinner    . HYDROcodone-acetaminophen (NORCO) 10-325 MG tablet Take 1-2 tablets by mouth  every 4 (four) hours as needed for moderate pain. Maximum dose per 24 hours - 8 pills 18 tablet 0  . meclizine (ANTIVERT) 25 MG tablet Take 25 mg by mouth 3 (three) times daily as needed for dizziness.    . tadalafil (CIALIS) 5 MG tablet Take 5 mg by mouth daily as needed for erectile dysfunction.     No current facility-administered medications for this encounter.    Physical Findings: The patient is in no acute distress. Patient is alert and oriented.  height is 5\' 9"  (1.753 m) and weight is 238 lb 11.2 oz (108.274 kg). His temperature is 99 F (37.2 C). His blood pressure is 165/78 and his pulse is 81. .  No significant changes.   Lab Findings: Lab Results  Component Value Date   WBC 6.0 06/02/2015   HGB 13.2 06/02/2015   HCT 40.5 06/02/2015   MCV 93.5 06/02/2015   PLT 221 06/02/2015    Radiographic Findings:  Patient underwent CT imaging in our clinic for post implant dosimetry. The CT appears to demonstrate an adequate distribution of radioactive seeds throughout the prostate gland. There no seeds in her near the rectum. I suspect the final radiation plan and dosimetry will show appropriate coverage of the prostate gland.   Impression: The patient is recovering from the effects of radiation. His urinary symptoms should gradually improve over the next 4-6 months. We talked about this today. He is encouraged  by his improvement already and is otherwise please with his outcome.   Plan: Today, I spent time talking to the patient about his prostate seed implant and resolving urinary symptoms. We also talked about long-term follow-up for prostate cancer following seed implant. He understands that ongoing PSA determinations and digital rectal exams will help perform surveillance to rule out disease recurrence. He understands what to expect with his PSA measures. Patient was also educated today about some of the long-term effects from radiation including a small risk for rectal bleeding and  possibly erectile dysfunction. We talked about some of the general management approaches to these potential complications. However, I did encourage the patient to contact our office or return at any point if he has questions or concerns related to his previous radiation and prostate cancer.   I have written the patient a prescription for flomax 0.4 mg today.  _____________________________________  Sheral Apley. Tammi Klippel, M.D.    This document serves as a record of services personally performed by [name], MD. It was created on his behalf by Arlyce Harman, a trained medical scribe. The creation of this record is based on the scribe's personal observations and the provider's statements to them. This document has been checked and approved by the attending provider.

## 2015-07-09 ENCOUNTER — Encounter: Payer: Self-pay | Admitting: Radiation Oncology

## 2015-07-09 DIAGNOSIS — Z510 Encounter for antineoplastic radiation therapy: Secondary | ICD-10-CM | POA: Diagnosis not present

## 2015-07-15 NOTE — Progress Notes (Signed)
  Radiation Oncology         [phone] ________________________________  Name: George Franco MRN: [id number]  Date: 07/09/2015  DOB: [birth year]  3D Planning Note   Prostate Brachytherapy Post-Implant Dosimetry  Diagnosis: 59 yo with stage T1c adenocarcinoma of the prostate with a Gleason's score of 4+3 and a PSA of 4.48  Narrative: On a previous date, George Franco returned following prostate seed implantation for post implant planning. He underwent CT scan complex simulation to delineate the three-dimensional structures of the pelvis and demonstrate the radiation distribution.  Since that time, the seed localization, and complex isodose planning with dose volume histograms have now been completed.  Results:   Prostate Coverage - The dose of radiation delivered to the 90% or more of the prostate gland (D90) was 112.75% of the prescription dose. This exceeds our goal of greater than 90%. Rectal Sparing - The volume of rectal tissue receiving the prescription dose or higher was 0.00 cc. This falls under our thresholds tolerance of 1.0 cc.  Impression: The prostate seed implant appears to show adequate target coverage and appropriate rectal sparing.  Plan:  The patient will continue to follow with urology for ongoing PSA determinations. I would anticipate a high likelihood for local tumor control with minimal risk for rectal morbidity.  ________________________________  Sheral Apley Tammi Klippel, M.D.

## 2015-11-23 ENCOUNTER — Other Ambulatory Visit: Payer: Self-pay | Admitting: Endocrinology

## 2015-11-23 DIAGNOSIS — R42 Dizziness and giddiness: Secondary | ICD-10-CM

## 2015-11-23 DIAGNOSIS — G629 Polyneuropathy, unspecified: Secondary | ICD-10-CM

## 2015-11-23 DIAGNOSIS — R4781 Slurred speech: Secondary | ICD-10-CM

## 2015-11-28 ENCOUNTER — Other Ambulatory Visit: Payer: Managed Care, Other (non HMO)

## 2016-02-05 ENCOUNTER — Encounter (HOSPITAL_COMMUNITY): Payer: Self-pay

## 2016-02-05 ENCOUNTER — Ambulatory Visit (INDEPENDENT_AMBULATORY_CARE_PROVIDER_SITE_OTHER): Payer: Managed Care, Other (non HMO)

## 2016-02-05 ENCOUNTER — Ambulatory Visit (INDEPENDENT_AMBULATORY_CARE_PROVIDER_SITE_OTHER): Payer: Managed Care, Other (non HMO) | Admitting: Emergency Medicine

## 2016-02-05 ENCOUNTER — Emergency Department (HOSPITAL_COMMUNITY)
Admission: EM | Admit: 2016-02-05 | Discharge: 2016-02-05 | Disposition: A | Discharge status: Home / Self Care | Payer: Managed Care, Other (non HMO) | Attending: Emergency Medicine | Admitting: Emergency Medicine

## 2016-02-05 VITALS — BP 140/66 | HR 111 | Temp 99.3°F | Resp 19 | Ht 69.0 in | Wt 244.4 lb

## 2016-02-05 DIAGNOSIS — W010XXA Fall on same level from slipping, tripping and stumbling without subsequent striking against object, initial encounter: Secondary | ICD-10-CM | POA: Insufficient documentation

## 2016-02-05 DIAGNOSIS — E785 Hyperlipidemia, unspecified: Secondary | ICD-10-CM | POA: Insufficient documentation

## 2016-02-05 DIAGNOSIS — E119 Type 2 diabetes mellitus without complications: Secondary | ICD-10-CM | POA: Diagnosis not present

## 2016-02-05 DIAGNOSIS — Z794 Long term (current) use of insulin: Secondary | ICD-10-CM | POA: Diagnosis not present

## 2016-02-05 DIAGNOSIS — Z8546 Personal history of malignant neoplasm of prostate: Secondary | ICD-10-CM | POA: Diagnosis not present

## 2016-02-05 DIAGNOSIS — S92331A Displaced fracture of third metatarsal bone, right foot, initial encounter for closed fracture: Secondary | ICD-10-CM | POA: Insufficient documentation

## 2016-02-05 DIAGNOSIS — Z87891 Personal history of nicotine dependence: Secondary | ICD-10-CM | POA: Diagnosis not present

## 2016-02-05 DIAGNOSIS — S92511A Displaced fracture of proximal phalanx of right lesser toe(s), initial encounter for closed fracture: Secondary | ICD-10-CM | POA: Diagnosis not present

## 2016-02-05 DIAGNOSIS — M25474 Effusion, right foot: Secondary | ICD-10-CM

## 2016-02-05 DIAGNOSIS — S91301A Unspecified open wound, right foot, initial encounter: Secondary | ICD-10-CM

## 2016-02-05 DIAGNOSIS — Y999 Unspecified external cause status: Secondary | ICD-10-CM | POA: Insufficient documentation

## 2016-02-05 DIAGNOSIS — Y9301 Activity, walking, marching and hiking: Secondary | ICD-10-CM | POA: Diagnosis not present

## 2016-02-05 DIAGNOSIS — S99921A Unspecified injury of right foot, initial encounter: Secondary | ICD-10-CM | POA: Diagnosis present

## 2016-02-05 DIAGNOSIS — S92341A Displaced fracture of fourth metatarsal bone, right foot, initial encounter for closed fracture: Secondary | ICD-10-CM | POA: Diagnosis not present

## 2016-02-05 DIAGNOSIS — Y92000 Kitchen of unspecified non-institutional (private) residence as  the place of occurrence of the external cause: Secondary | ICD-10-CM | POA: Insufficient documentation

## 2016-02-05 DIAGNOSIS — Z23 Encounter for immunization: Secondary | ICD-10-CM

## 2016-02-05 DIAGNOSIS — S92301A Fracture of unspecified metatarsal bone(s), right foot, initial encounter for closed fracture: Secondary | ICD-10-CM

## 2016-02-05 DIAGNOSIS — S92501B Displaced unspecified fracture of right lesser toe(s), initial encounter for open fracture: Secondary | ICD-10-CM

## 2016-02-05 DIAGNOSIS — Z79899 Other long term (current) drug therapy: Secondary | ICD-10-CM | POA: Diagnosis not present

## 2016-02-05 DIAGNOSIS — I10 Essential (primary) hypertension: Secondary | ICD-10-CM | POA: Insufficient documentation

## 2016-02-05 DIAGNOSIS — S91119A Laceration without foreign body of unspecified toe without damage to nail, initial encounter: Secondary | ICD-10-CM

## 2016-02-05 LAB — BASIC METABOLIC PANEL
Anion gap: 12 (ref 5–15)
BUN: 16 mg/dL (ref 6–20)
CO2: 24 mmol/L (ref 22–32)
Calcium: 9.2 mg/dL (ref 8.9–10.3)
Chloride: 101 mmol/L (ref 101–111)
Creatinine, Ser: 0.9 mg/dL (ref 0.61–1.24)
GFR calc Af Amer: >60 mL/min (ref >60)
GFR calc non Af Amer: >60 mL/min (ref >60)
Glucose, Bld: 185 mg/dL — ABNORMAL HIGH (ref 65–99)
Potassium: 4 mmol/L (ref 3.5–5.1)
Sodium: 137 mmol/L (ref 135–145)

## 2016-02-05 LAB — CBC WITH DIFFERENTIAL/PLATELET
Basophils Absolute: 0 10*3/uL (ref 0.0–0.1)
Basophils Relative: 0 %
Eosinophils Absolute: 0.1 10*3/uL (ref 0.0–0.7)
Eosinophils Relative: 1 %
HCT: 42.6 % (ref 39.0–52.0)
Hemoglobin: 13.6 g/dL (ref 13.0–17.0)
Lymphocytes Relative: 20 %
Lymphs Abs: 1.4 10*3/uL (ref 0.7–4.0)
MCH: 28.6 pg (ref 26.0–34.0)
MCHC: 31.9 g/dL (ref 30.0–36.0)
MCV: 89.5 fL (ref 78.0–100.0)
Monocytes Absolute: 0.4 10*3/uL (ref 0.1–1.0)
Monocytes Relative: 6 %
Neutro Abs: 5.1 10*3/uL (ref 1.7–7.7)
Neutrophils Relative %: 73 %
Platelets: 222 10*3/uL (ref 150–400)
RBC: 4.76 MIL/uL (ref 4.22–5.81)
RDW: 14.3 % (ref 11.5–15.5)
WBC: 7 10*3/uL (ref 4.0–10.5)

## 2016-02-05 LAB — GLUCOSE, POCT (MANUAL RESULT ENTRY): POC GLUCOSE: 257 mg/dL — AB (ref 70–99)

## 2016-02-05 MED ORDER — HYDROCODONE-ACETAMINOPHEN 5-325 MG PO TABS: 1.0000 | ORAL_TABLET | Freq: Three times a day (TID) | ORAL | Status: DC

## 2016-02-05 MED ORDER — TETANUS-DIPHTH-ACELL PERTUSSIS 5-2.5-18.5 LF-MCG/0.5 IM SUSP: 0.5000 mL | Freq: Once | INTRAMUSCULAR | Status: DC

## 2016-02-05 MED ORDER — CEPHALEXIN 500 MG PO CAPS: 500.0000 mg | ORAL_CAPSULE | Freq: Four times a day (QID) | ORAL | Status: DC

## 2016-02-05 MED ORDER — HYDROCODONE-ACETAMINOPHEN 5-325 MG PO TABS: 2.0000 | ORAL_TABLET | ORAL | Status: DC | PRN

## 2016-02-05 MED ORDER — CEFAZOLIN SODIUM-DEXTROSE 2-4 GM/100ML-% IV SOLN
2.0000 g | Freq: Once | INTRAVENOUS | Status: AC
  Administered 2016-02-05: 2 g via INTRAVENOUS
  Filled 2016-02-05: qty 100

## 2016-02-05 NOTE — Patient Instructions (Signed)
     IF you received an x-ray today, you will receive an invoice from [facility]. Please contact [facility] at [phone] with questions or concerns regarding your invoice.   IF you received labwork today, you will receive an invoice from [facility]. Please contact [facility] at [phone] with questions or concerns regarding your invoice.   Our billing staff will not be able to assist you with questions regarding bills from these companies.  You will be contacted with the lab results as soon as they are available. The fastest way to get your results is to activate your My Chart account. Instructions are located on the last page of this paperwork. If you have not heard from us regarding the results in 2 weeks, please contact this office.      

## 2016-02-05 NOTE — Discharge Instructions (Signed)
Medications: Keflex, Norco  Treatment: Take Keflex as prescribed. Take Norco every 8 hours as needed for pain. Do not drive or operate machinery while taking this medication. Keep dressing on your foot until you see Dr. Rolena Infante. Keep dressing clean and dry.   Follow-up: Please follow up with Dr. Rolena Infante on Monday. Please call your doctor immediately or return to the emergency department if you develop any new or worsening symptoms including fever, increasing pain, redness, swelling, drainage, or any other new or concerning symptom.

## 2016-02-05 NOTE — ED Provider Notes (Signed)
CSN: [id number]     Arrival date & time 02/05/16  1411 History   First MD Initiated Contact with Patient 02/05/16 1531     Chief Complaint  Patient presents with  . Toe Injury  . Extremity Laceration     (Consider location/radiation/quality/duration/timing/severity/associated sxs/prior Treatment) HPI Comments: 60 year old male history of diabetes, hypertension who presents with R toe laceration and fracture after a fall last night around 9 PM . Patient states he was walking in the kitchen and slipped on a slippery part of the floor in bare feet, and fell. Patient is not sure if he hit his head, however he had no loss of consciousness and no headache. Patient saw his PCP today where an x-ray was completed. Patient has a right proximal fifth phalanx fracture and third and fourth metatarsal neck fracture. Patient's pain is better than last night, however it is still painful to palpation and to walk. The patient is able to ambulate. Patient denies any fevers, chest pain, shortness of breath, abdominal pain, nausea, vomiting.   The history is provided by the patient and the spouse.    Past Medical History  Diagnosis Date  . Hypertension   . Hyperlipidemia   . LVH (left ventricular hypertrophy)   . Hypogonadism in male   . Type 2 diabetes mellitus ([place])   . BPH (benign prostatic hyperplasia)   . Prostate cancer [facility]) urologist-  dr ottelin/  oncologist-  [name]    Stage T1c, Gleason 4+3, PSA 4.48,  vol 32.9cc--  External RXT complete 05-08-2015  . ED (erectile dysfunction) of organic origin   . Lower urinary tract symptoms (LUTS)   . Wears glasses   . At risk for sleep apnea     STOP-BANG= 5     SENT TO PCP 06-02-2015   Past Surgical History  Procedure Laterality Date  . Rotator cuff repair Right 2014  . Prostate biopsy    . Umbilical hernia repair  1990's  . Radioactive seed implant N/A 06/05/2015    Procedure: RADIOACTIVE SEED IMPLANT/BRACHYTHERAPY IMPLANT;  Surgeon: [name], MD;  Location: [facility];  Service: Urology;  Laterality: N/A;   Family History  Problem Relation Age of Onset  . Prostate cancer Father   . Cancer Father     prostate  . Heart failure Mother   . Diabetes Mother    Social History  Substance Use Topics  . Smoking status: Former Smoker -- 1.00 packs/day for 20 years    Types: Cigarettes    Quit date: 06/01/2014  . Smokeless tobacco: Never Used  . Alcohol Use: No    Review of Systems  Constitutional: Negative for fever and chills.  HENT: Negative for facial swelling and sore throat.   Respiratory: Negative for shortness of breath.   Cardiovascular: Negative for chest pain.  Gastrointestinal: Negative for nausea, vomiting and abdominal pain.  Genitourinary: Negative for dysuria.  Musculoskeletal: Positive for joint swelling (R Foot ). Negative for back pain.  Skin: Negative for rash and wound.  Neurological: Negative for headaches.  Psychiatric/Behavioral: The patient is not nervous/anxious.       Allergies  Review of patient's allergies indicates no known allergies.  Home Medications   Prior to Admission medications   Medication Sig Start Date End Date Taking? Authorizing Provider  amLODipine (NORVASC) 5 MG tablet Take 5 mg by mouth daily. 11/23/15  Yes Historical Provider, MD  benazepril (LOTENSIN) 40 MG tablet Take 40 mg by mouth daily.   Yes  Historical Provider, MD  canagliflozin (INVOKANA) 300 MG TABS tablet Take 300 mg by mouth daily.   Yes Historical Provider, MD  HUMALOG MIX 50/50 (50-50) 100 UNIT/ML SUSP injection Inject 30-40 Units into the skin 3 (three) times daily. 40 units w/ breakfast,  30 units w/ lunch,  40 units w/ dinner 12/24/14  Yes Historical Provider, MD  metoprolol succinate (TOPROL-XL) 25 MG 24 hr tablet Take 25 mg by mouth daily. 01/11/16  Yes Historical Provider, MD  cephALEXin (KEFLEX) 500 MG capsule Take 1 capsule (500 mg total) by mouth 4 (four) times daily. 02/05/16    Frederica Kuster, PA-C  HYDROcodone-acetaminophen (NORCO/VICODIN) 5-325 MG tablet Take 1-2 tablets by mouth 3 (three) times daily. 02/05/16   Kylle Lall M Eldo Umanzor, PA-C   BP 136/60 mmHg  Pulse 100  Temp(Src) 97.2 F (36.2 C) (Oral)  Resp 16  Ht 5\' 9"  (1.753 m)  Wt 108.863 kg  BMI 35.43 kg/m2  SpO2 98% Physical Exam  Constitutional: He appears well-developed and well-nourished. No distress.  HENT:  Head: Normocephalic and atraumatic.  Mouth/Throat: Oropharynx is clear and moist. No oropharyngeal exudate.  Eyes: Conjunctivae are normal. Pupils are equal, round, and reactive to light. Right eye exhibits no discharge. Left eye exhibits no discharge. No scleral icterus.  Neck: Normal range of motion. Neck supple. No thyromegaly present.  Cardiovascular: Normal rate, regular rhythm, normal heart sounds and intact distal pulses.  Exam reveals no gallop and no friction rub.   No murmur heard. Pulmonary/Chest: Effort normal and breath sounds normal. No stridor. No respiratory distress. He has no wheezes. He has no rales.  Abdominal: Soft. Bowel sounds are normal. He exhibits no distension. There is no tenderness. There is no rebound and no guarding.  Musculoskeletal: He exhibits no edema.       Right foot: There is laceration (between 4th and 5th digit, 3 cm in length).       Feet:  5/5 strength in bilateral LEs, cap refill <2 secs, patient able to move all toes flexion and extension  Lymphadenopathy:    He has no cervical adenopathy.  Neurological: He is alert. Coordination normal.  Normal sensation to bilateral LEs  Skin: Skin is warm and dry. No rash noted. He is not diaphoretic. No pallor.  Psychiatric: He has a normal mood and affect.  Nursing note and vitals reviewed.   ED Course  Procedures (including critical care time) Labs Review Labs Reviewed  BASIC METABOLIC PANEL - Abnormal; Notable for the following:    Glucose, Bld 185 (*)    All other components within normal limits  CBC  WITH DIFFERENTIAL/PLATELET    Imaging Review Dg Foot Complete Right  02/05/2016  CLINICAL DATA:  Fourth and fifth toe wound after injury last night. Little toe laceration EXAM: RIGHT FOOT COMPLETE - 3+ VIEW COMPARISON:  01/28/2006 FINDINGS: Fracture involving the distal but extra-articular aspect of the fifth proximal phalanx with 50% lateral displacement. No dislocation. Remote fracture of the fifth proximal phalanx with mild residual deformity. Third and fourth lateral metatarsal neck fractures with mild impaction. Heel spurs.  Arterial calcification. IMPRESSION: 1. Displaced fifth proximal phalanx fracture. 2. Mildly impacted third and fourth metatarsal neck fractures. Electronically Signed   By: Monte Fantasia M.D.   On: 02/05/2016 13:23   I have personally reviewed and evaluated these images and lab results as part of my medical decision-making.   EKG Interpretation None      MDM   X-ray of right foot taken by  PCP showed displaced fifth proximal phalanx fracture and mildly impacted third and fourth metatarsal neck fractures. Laceration out of the window to repair and open fracture. Patient seen by Dr. Rolena Infante who would like the patient given antibiotics, wound copiously irrigated, dressed, and patient given a post op shoe. CBC unremarkable. BMP shows glucose 185. 2 g Ancef given in ED. Patient discharged on Keflex and Norco with follow-up with Dr. Rolena Infante on Monday in his office. Patient understands and agrees with plan. Patient discharged in satisfactory condition. Patient also evaluated by Dr. Ralene Bathe who is in agreement of plan.  Final diagnoses:  Laceration of toe of right foot, initial encounter  Fracture of fifth toe, right, open, initial encounter  Fracture of metatarsal bone, right, closed, initial encounter        Frederica Kuster, PA-C 02/05/16 2034  Quintella Reichert, MD 02/06/16 1216

## 2016-02-05 NOTE — ED Notes (Addendum)
Pt sent by PCP.  C/o multiple broken toes and laceration between 4th and 5th digits after a fall last night.  Pain score 5/10.  Pt has not taken anything for pain.  Hx of DM.  Xray in chart review.

## 2016-02-05 NOTE — Progress Notes (Signed)
By signing my name below, I, Mesha Guinyard, attest that this documentation has been prepared under the direction and in the presence of Arlyss Queen, MD.  Electronically Signed: Verlee Monte, Medical Scribe. 02/05/2016. 12:42 PM.  Chief Complaint:  Chief Complaint  Patient presents with  . Fall    fell last night, injuried right pinky toe, right foot swollen     HPI: George Franco is a 59 y.o. male with a PMHx of DM who reports to [facility] today complaining of right pink toe pain onset last night. Pt tripped from the screws on the bottom of his [employer]. Pt reports a cut on his right pink toe from the injury. Pt's wife wrapped his toe in a dressing. Pt isn't sure if he had his tetanus shot in the last 10 years. Pt states most of his records are through [facility].  Pt states his DM is controlled. Pt see's Dr. Forde Dandy for his DM.  Past Medical History  Diagnosis Date  . Hypertension   . Hyperlipidemia   . LVH (left ventricular hypertrophy)   . Hypogonadism in male   . Type 2 diabetes mellitus ([place])   . BPH (benign prostatic hyperplasia)   . Prostate cancer [facility]) urologist-  dr ottelin/  oncologist-  [name]    Stage T1c, Gleason 4+3, PSA 4.48,  vol 32.9cc--  External RXT complete 05-08-2015  . ED (erectile dysfunction) of organic origin   . Lower urinary tract symptoms (LUTS)   . Wears glasses   . At risk for sleep apnea     STOP-BANG= 5     SENT TO PCP 06-02-2015   Past Surgical History  Procedure Laterality Date  . Rotator cuff repair Right 2014  . Prostate biopsy    . Umbilical hernia repair  1990's  . Radioactive seed implant N/A 06/05/2015    Procedure: RADIOACTIVE SEED IMPLANT/BRACHYTHERAPY IMPLANT;  Surgeon: [name], MD;  Location: [facility];  Service: Urology;  Laterality: N/A;   Social History   Social History  . Marital Status: Married    Spouse Name: N/A  . Number of Children: N/A  . Years of Education: N/A   Social History Main  Topics  . Smoking status: Former Smoker -- 1.00 packs/day for 20 years    Types: Cigarettes    Quit date: 06/01/2014  . Smokeless tobacco: Never Used  . Alcohol Use: No  . Drug Use: No  . Sexual Activity:    Partners: Female   Other Topics Concern  . None   Social History Narrative   Family History  Problem Relation Age of Onset  . Prostate cancer Father   . Cancer Father     prostate  . Heart failure Mother   . Diabetes Mother    No Known Allergies Prior to Admission medications   Medication Sig Start Date End Date Taking? Authorizing Provider  amLODipine (NORVASC) 2.5 MG tablet Take 2.5 mg by mouth every morning.    Yes Historical Provider, MD  HUMALOG MIX 50/50 (50-50) 100 UNIT/ML SUSP injection Inject into the skin 3 (three) times daily. 40 units w/ breakfast,  30 units w/ lunch,  40 units w/ dinner 12/24/14  Yes Historical Provider, MD  meclizine (ANTIVERT) 25 MG tablet Take 25 mg by mouth 3 (three) times daily as needed for dizziness.   Yes Historical Provider, MD  tamsulosin (FLOMAX) 0.4 MG CAPS capsule Take 1 capsule (0.4 mg total) by mouth daily after supper. 06/26/15  Yes  [name], MD     ROS: The patient denies fevers, chills, night sweats, unintentional weight loss, chest pain, palpitations, wheezing, dyspnea on exertion, nausea, vomiting, abdominal pain, dysuria, hematuria, melena, numbness, weakness, or tingling.  All other systems have been reviewed and were otherwise negative with the exception of those mentioned in the HPI and as above.    PHYSICAL EXAM: Filed Vitals:   02/05/16 1235 02/05/16 1241  BP: 160/76 140/66  Pulse: 111   Temp: 99.3 F (37.4 C)   Resp: 19    Body mass index is 36.08 kg/(m^2).   General: Alert, no acute distress HEENT:  Normocephalic, atraumatic, oropharynx patent. Eye: Juliette Mangle [facility] Cardiovascular:  Irregular rate and rhythm, no rubs murmurs or gallops.  No Carotid bruits, radial pulse intact. No pedal edema.    Respiratory: Clear to auscultation bilaterally.  No wheezes, rales, or rhonchi.  No cyanosis, no use of accessory musculature Abdominal: No organomegaly, abdomen is soft and non-tender, positive bowel sounds.  No masses. Musculoskeletal: Gait intact. Significant swelling over the lower half of foot Skin: No rashes. Deep laceration between the 4th and 5th toe Neurologic: Facial musculature symmetric. Psychiatric: Patient acts appropriately throughout our interaction. Lymphatic: No cervical or submandibular lymphadenopathy  LABS: Results for orders placed or performed in visit on 02/05/16  POCT glucose (manual entry)  Result Value Ref Range   POC Glucose 257 (A) 70 - 99 mg/dl   EKG/XRAY:   Primary read interpreted by Dr. Everlene Farrier at [facility]. Dg Foot Complete Right  02/05/2016  CLINICAL DATA:  Fourth and fifth toe wound after injury last night. Little toe laceration EXAM: RIGHT FOOT COMPLETE - 3+ VIEW COMPARISON:  01/28/2006 FINDINGS: Fracture involving the distal but extra-articular aspect of the fifth proximal phalanx with 50% lateral displacement. No dislocation. Remote fracture of the fifth proximal phalanx with mild residual deformity. Third and fourth lateral metatarsal neck fractures with mild impaction. Heel spurs.  Arterial calcification. IMPRESSION: 1. Displaced fifth proximal phalanx fracture. 2. Mildly impacted third and fourth metatarsal neck fractures. Electronically Signed   By: Monte Fantasia M.D.   On: 02/05/2016 13:23   ASSESSMENT/PLAN: Patient has an open displaced fracture of the proximal phalanx of the fifth toe, as well as third and fourth metatarsal neck fractures. Tetanus shot given in the office, will go to [facility] for evaluation. I personally performed the services described in this documentation, which was scribed in my presence. The recorded information has been reviewed and is accurate.   Gross sideeffects, risk and benefits, and alternatives of medications  d/w patient. Patient is aware that all medications have potential sideeffects and we are unable to predict every sideeffect or drug-drug interaction that may occur.  Arlyss Queen MD 02/05/2016 12:42 PM

## 2016-02-05 NOTE — Consult Note (Signed)
George Stack, MD Chief Complaint: Open right 5th proximal phalanx fx History: George Franco is a 60 y.o. male with a PMHx of DM who reports to [facility] today complaining of right pink toe pain onset last night. Pt tripped from the screws on the bottom of his [employer]. Pt reports a cut on his right pink toe from the injury. Pt's wife wrapped his toe in a dressing. Pt isn't sure if he had his tetanus shot in the last 10 years. Pt states most of his records are through [facility].  Pt states his DM is controlled. Pt see's Dr. Forde Dandy for his DM. Past Medical History  Diagnosis Date  . Hypertension   . Hyperlipidemia   . LVH (left ventricular hypertrophy)   . Hypogonadism in male   . Type 2 diabetes mellitus ([place])   . BPH (benign prostatic hyperplasia)   . Prostate cancer [facility]) urologist-  dr ottelin/  oncologist-  [name]    Stage T1c, Gleason 4+3, PSA 4.48,  vol 32.9cc--  External RXT complete 05-08-2015  . ED (erectile dysfunction) of organic origin   . Lower urinary tract symptoms (LUTS)   . Wears glasses   . At risk for sleep apnea     STOP-BANG= 5     SENT TO PCP 06-02-2015    No Known Allergies  No current facility-administered medications on file prior to encounter.   Current Outpatient Prescriptions on File Prior to Encounter  Medication Sig Dispense Refill  . HUMALOG MIX 50/50 (50-50) 100 UNIT/ML SUSP injection Inject 30-40 Units into the skin 3 (three) times daily. 40 units w/ breakfast,  30 units w/ lunch,  40 units w/ dinner      Physical Exam: Filed Vitals:   02/05/16 1411 02/05/16 1714  BP: 157/74 136/60  Pulse: 103 100  Temp: 98 F (36.7 C) 97.2 F (36.2 C)  Resp: 15 16  A+OX3 NVI - EHL/TA/GA intact Sensation to LT intact Compartments soft/NT NO sob/CP Abd soft/NT 2+ DP/PT pulses  Image: Dg Foot Complete Right  02/05/2016  CLINICAL DATA:  Fourth and fifth toe wound after injury last night. Little toe laceration EXAM: RIGHT FOOT COMPLETE - 3+  VIEW COMPARISON:  01/28/2006 FINDINGS: Fracture involving the distal but extra-articular aspect of the fifth proximal phalanx with 50% lateral displacement. No dislocation. Remote fracture of the fifth proximal phalanx with mild residual deformity. Third and fourth lateral metatarsal neck fractures with mild impaction. Heel spurs.  Arterial calcification. IMPRESSION: 1. Displaced fifth proximal phalanx fracture. 2. Mildly impacted third and fourth metatarsal neck fractures. Electronically Signed   By: Monte Fantasia M.D.   On: 02/05/2016 13:23    A/P: Extra-articular 5th proximal phalanx fx with laceration No active bleeding Plan on wash out in ER and application of dry dressing Hard sole shoe for limited ambulation (if uncomfortable then CAM boot) F/U Monday with me for wound check. Keflex 500mg  QID for 10 days Norco 5/325 1 po TID prn for pain

## 2016-04-04 IMAGING — DX DG CHEST 2V
2 series · 2 of 2 positions shown · non-contrast
Comparison: Portable chest x-ray November 28, 2006 and PA and
lateral chest x-ray November 04, 2003.

CLINICAL DATA: Preoperative examination prior to radioactive seed
implant for prostate malignancy, history of tobacco use, diabetes,
hypertension, and left ventricular hypertrophy.

EXAM:
CHEST  2 VIEW

[chest pa]
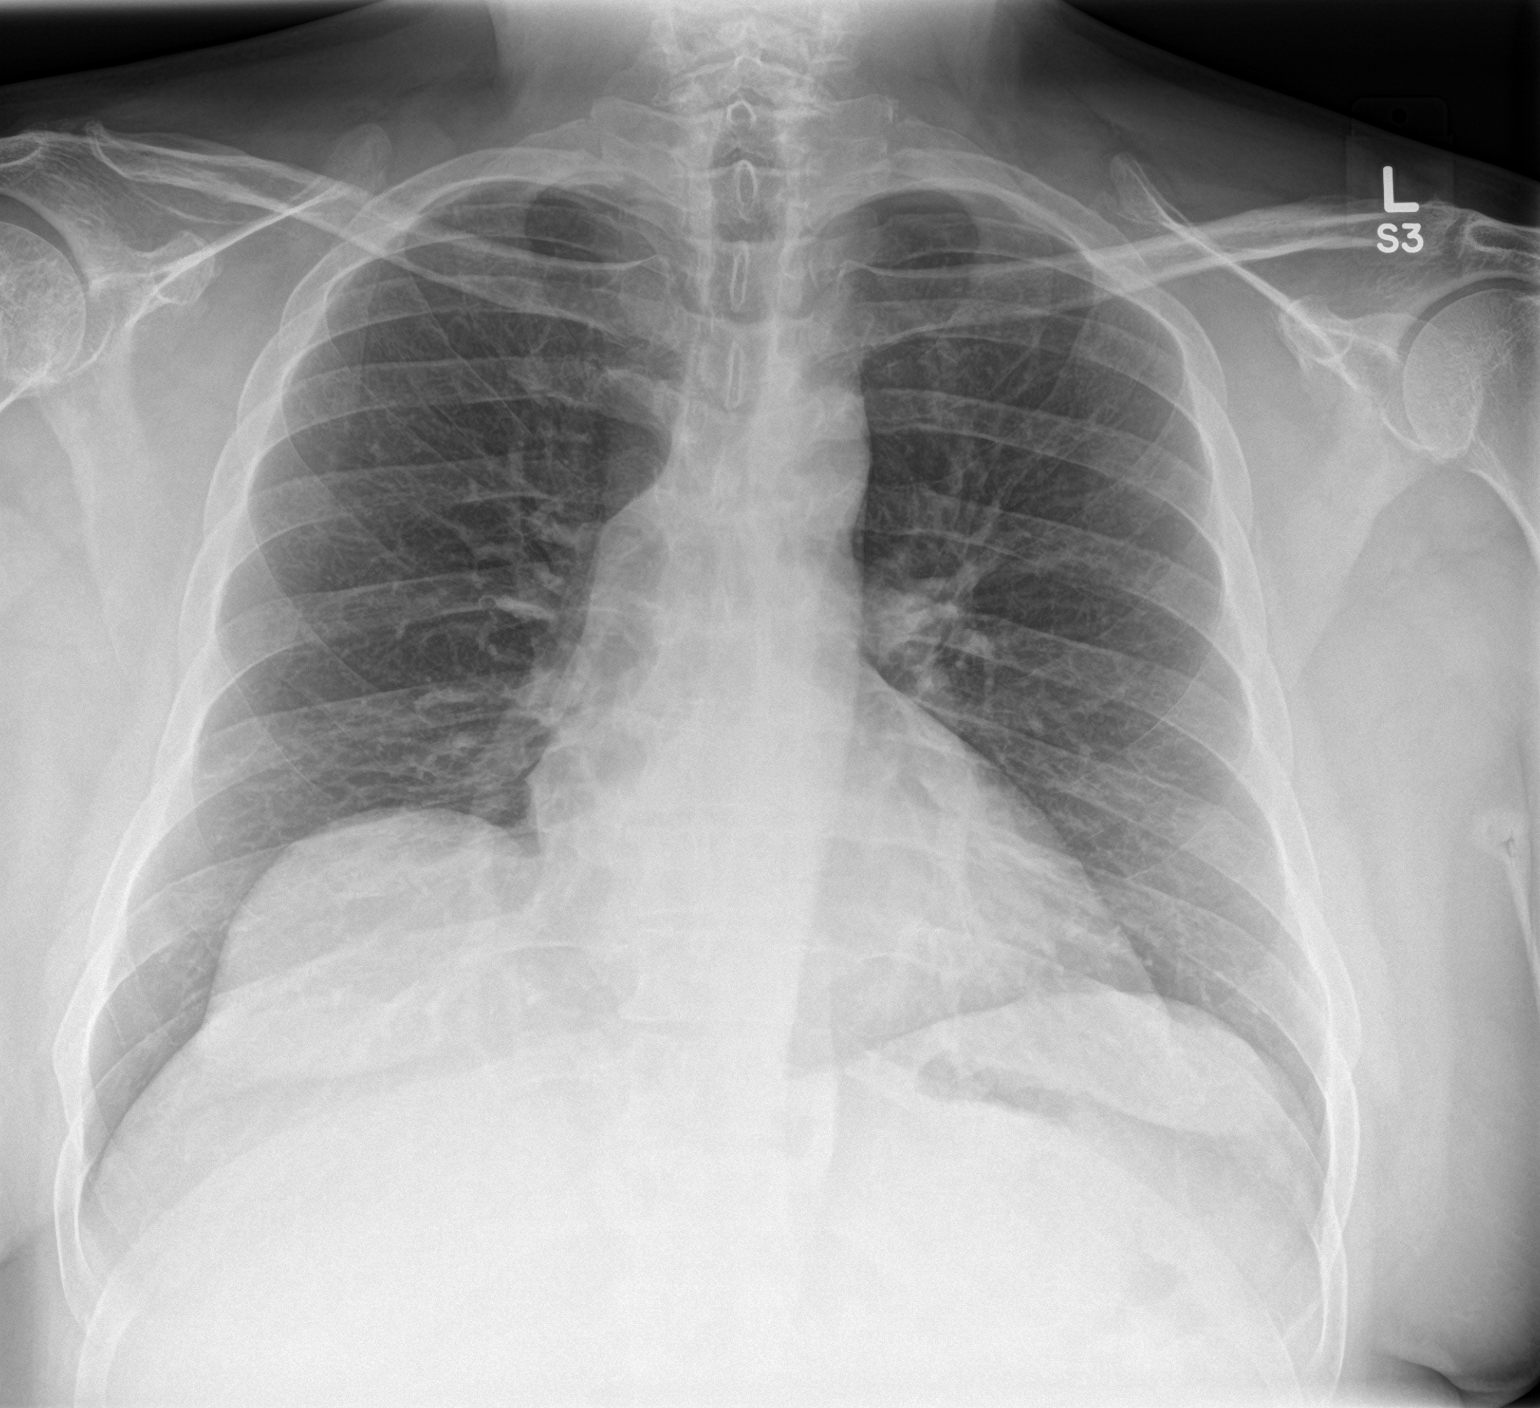

[chest lat]
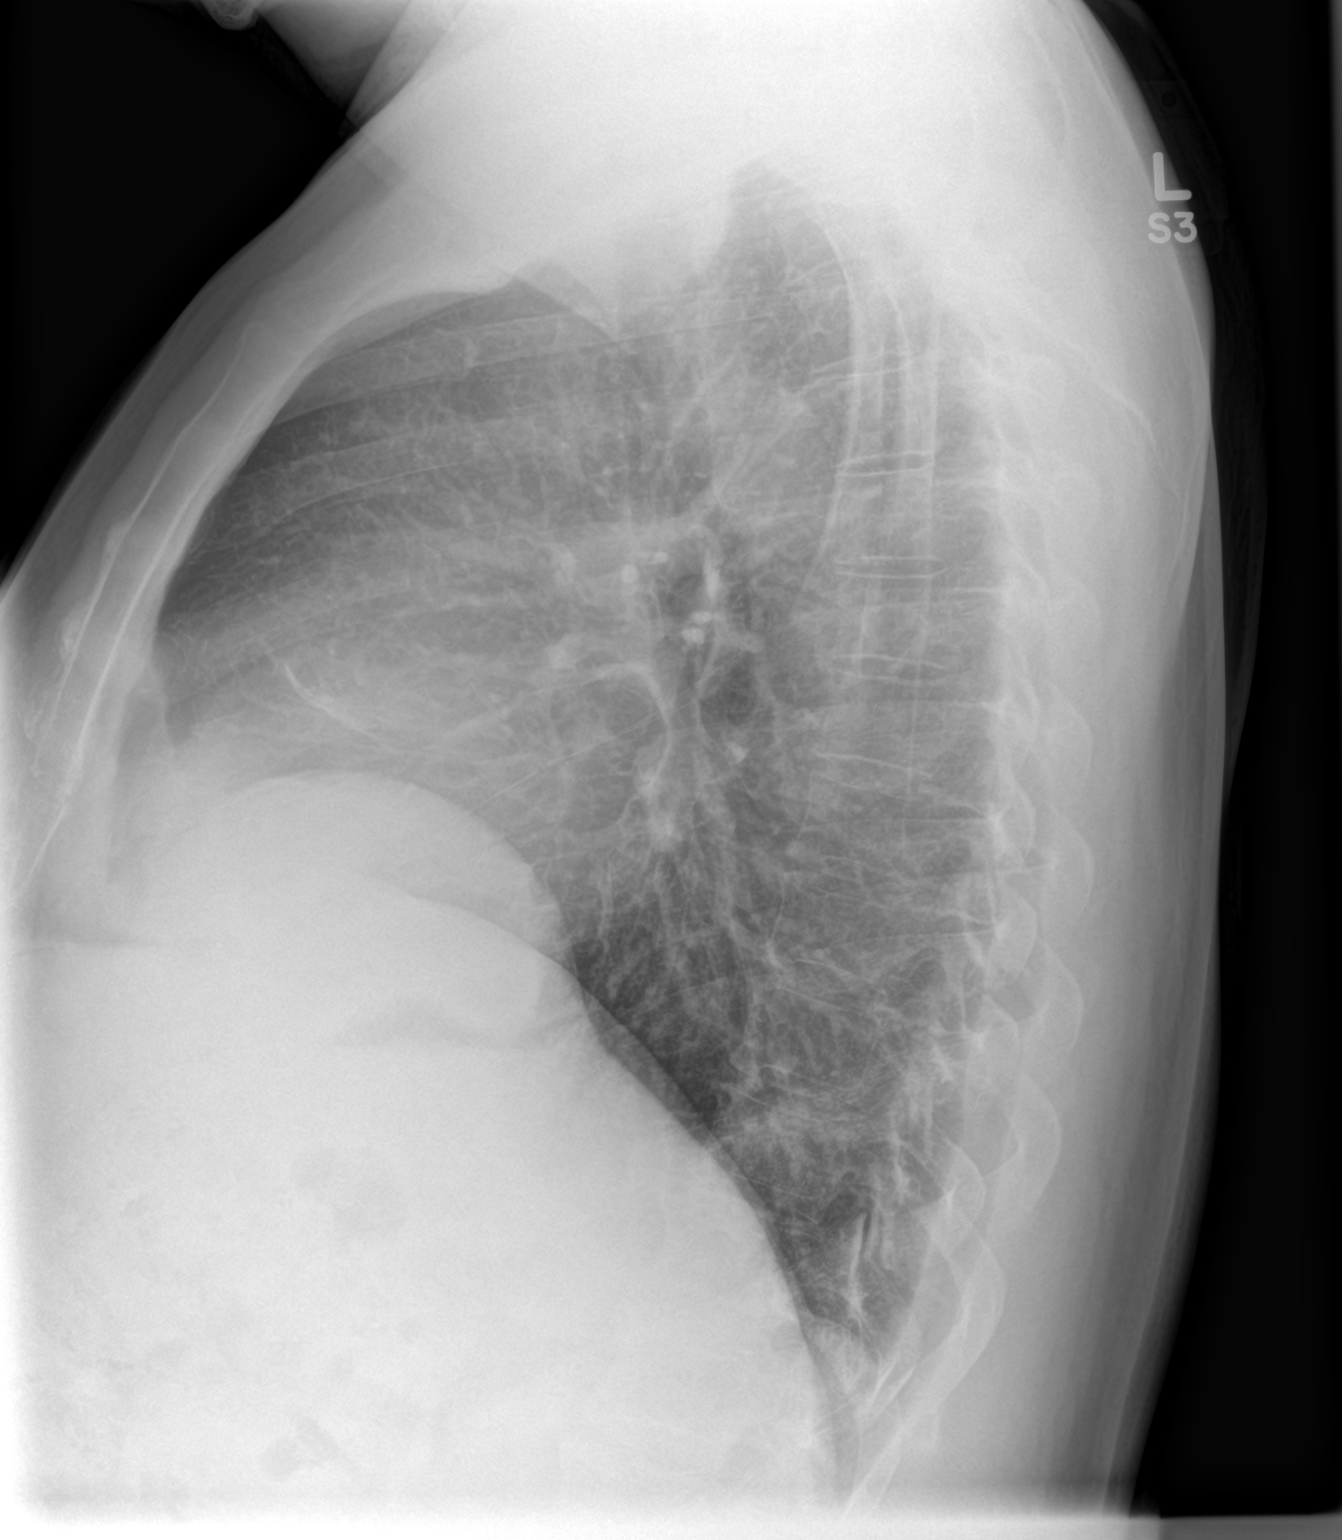

[2 of 2 positions shown; findings below may reference images not displayed]

FINDINGS: The lungs are adequately inflated and clear. The heart and pulmonary
vascularity are normal. The mediastinum is normal in width. There is
no pleural effusion. The bony thorax exhibits no acute abdomen
abnormality.
IMPRESSION: There is no active cardiopulmonary disease

## 2016-04-13 ENCOUNTER — Ambulatory Visit (INDEPENDENT_AMBULATORY_CARE_PROVIDER_SITE_OTHER): Payer: Managed Care, Other (non HMO) | Admitting: Podiatry

## 2016-04-13 VITALS — BP 157/91 | HR 75 | Ht 64.0 in | Wt 245.0 lb

## 2016-04-13 DIAGNOSIS — B351 Tinea unguium: Secondary | ICD-10-CM

## 2016-04-13 DIAGNOSIS — S92911A Unspecified fracture of right toe(s), initial encounter for closed fracture: Secondary | ICD-10-CM | POA: Diagnosis not present

## 2016-04-13 DIAGNOSIS — E1140 Type 2 diabetes mellitus with diabetic neuropathy, unspecified: Secondary | ICD-10-CM

## 2016-04-13 DIAGNOSIS — M79676 Pain in unspecified toe(s): Secondary | ICD-10-CM

## 2016-04-13 NOTE — Progress Notes (Signed)
SUBJECTIVE Patient with a history of diabetes mellitus presents to office today for routine diabetic evaluation. Patient does state that on June 30 he broke his right fifth toe. He did present to the emergency department which took x-rays and verified the fracture. Today there is no pain on palpation of the right fifth toe. This has no other complaints at this time.   OBJECTIVE General Patient is awake, alert, and oriented x 3 and in no acute distress. Derm Skin is dry and supple bilateral. Negative open lesions or macerations. Remaining integument unremarkable. Nails are tender, long, thickened and dystrophic with subungual debris, consistent with onychomycosis, 1-5 bilateral. No signs of infection noted. Vasc  DP and PT pedal pulses palpable bilaterally. Temperature gradient within normal limits.  Neuro Epicritic and protective threshold sensation diminished bilaterally.  Musculoskeletal Exam No symptomatic pedal deformities noted bilateral. Muscular strength within normal limits.  ASSESSMENT 1. Diabetes Mellitus w/ uncomplicated 2. Right fifth toe fracture-closed, nondisplaced.    PLAN OF CARE Patient evaluated today. Instructed to maintain good pedal hygiene and foot care. Stressed importance of controlling blood sugar.  Discussed wearing appropriate shoe gear to protect feet especially in diabetic patients. Mechanical debridement of nails 1 through 5 bilaterally was performed using no nipper and rotary bur without incident. All patient questions were answered. Return to clinic in 1 year for routine diabetic evaluation.    Edrick Kins, DPM

## 2016-04-16 NOTE — Patient Instructions (Signed)
Diabetes and Foot Care Diabetes may cause you to have problems because of poor blood supply (circulation) to your feet and legs. This may cause the skin on your feet to become thinner, break easier, and heal more slowly. Your skin may become dry, and the skin may peel and crack. You may also have nerve damage in your legs and feet causing decreased feeling in them. You may not notice minor injuries to your feet that could lead to infections or more serious problems. Taking care of your feet is one of the most important things you can do for yourself.  HOME CARE INSTRUCTIONS  Wear shoes at all times, even in the house. Do not go barefoot. Bare feet are easily injured.  Check your feet daily for blisters, cuts, and redness. If you cannot see the bottom of your feet, use a mirror or ask someone for help.  Wash your feet with warm water (do not use hot water) and mild soap. Then pat your feet and the areas between your toes until they are completely dry. Do not soak your feet as this can dry your skin.  Apply a moisturizing lotion or petroleum jelly (that does not contain alcohol and is unscented) to the skin on your feet and to dry, brittle toenails. Do not apply lotion between your toes.  Trim your toenails straight across. Do not dig under them or around the cuticle. File the edges of your nails with an emery board or nail file.  Do not cut corns or calluses or try to remove them with medicine.  Wear clean socks or stockings every day. Make sure they are not too tight. Do not wear knee-high stockings since they may decrease blood flow to your legs.  Wear shoes that fit properly and have enough cushioning. To break in new shoes, wear them for just a few hours a day. This prevents you from injuring your feet. Always look in your shoes before you put them on to be sure there are no objects inside.  Do not cross your legs. This may decrease the blood flow to your feet.  If you find a minor scrape,  cut, or break in the skin on your feet, keep it and the skin around it clean and dry. These areas may be cleansed with mild soap and water. Do not cleanse the area with peroxide, alcohol, or iodine.  When you remove an adhesive bandage, be sure not to damage the skin around it.  If you have a wound, look at it several times a day to make sure it is healing.  Do not use heating pads or hot water bottles. They may burn your skin. If you have lost feeling in your feet or legs, you may not know it is happening until it is too late.  Make sure your health care provider performs a complete foot exam at least annually or more often if you have foot problems. Report any cuts, sores, or bruises to your health care provider immediately. SEEK MEDICAL CARE IF:   You have an injury that is not healing.  You have cuts or breaks in the skin.  You have an ingrown nail.  You notice redness on your legs or feet.  You feel burning or tingling in your legs or feet.  You have pain or cramps in your legs and feet.  Your legs or feet are numb.  Your feet always feel cold. SEEK IMMEDIATE MEDICAL CARE IF:   There is increasing redness,   swelling, or pain in or around a wound.  There is a red line that goes up your leg.  Pus is coming from a wound.  You develop a fever or as directed by your health care provider.  You notice a bad smell coming from an ulcer or wound.   This information is not intended to replace advice given to you by your health care provider. Make sure you discuss any questions you have with your health care provider.   Document Released: 07/22/2000 Document Revised: 03/27/2013 Document Reviewed: 01/01/2013 Elsevier Interactive Patient Education 2016 [employer]  

## 2016-08-09 DIAGNOSIS — H04202 Unspecified epiphora, left lacrimal gland: Secondary | ICD-10-CM | POA: Diagnosis not present

## 2016-08-09 DIAGNOSIS — E119 Type 2 diabetes mellitus without complications: Secondary | ICD-10-CM | POA: Diagnosis not present

## 2016-08-09 DIAGNOSIS — H35712 Central serous chorioretinopathy, left eye: Secondary | ICD-10-CM | POA: Diagnosis not present

## 2016-09-07 DIAGNOSIS — E1165 Type 2 diabetes mellitus with hyperglycemia: Secondary | ICD-10-CM | POA: Diagnosis not present

## 2016-09-07 DIAGNOSIS — N182 Chronic kidney disease, stage 2 (mild): Secondary | ICD-10-CM | POA: Diagnosis not present

## 2016-09-07 DIAGNOSIS — I10 Essential (primary) hypertension: Secondary | ICD-10-CM | POA: Diagnosis not present

## 2016-09-07 DIAGNOSIS — R42 Dizziness and giddiness: Secondary | ICD-10-CM | POA: Diagnosis not present

## 2017-02-01 DIAGNOSIS — N182 Chronic kidney disease, stage 2 (mild): Secondary | ICD-10-CM | POA: Diagnosis not present

## 2017-02-01 DIAGNOSIS — C61 Malignant neoplasm of prostate: Secondary | ICD-10-CM | POA: Diagnosis not present

## 2017-02-01 DIAGNOSIS — E784 Other hyperlipidemia: Secondary | ICD-10-CM | POA: Diagnosis not present

## 2017-02-01 DIAGNOSIS — R112 Nausea with vomiting, unspecified: Secondary | ICD-10-CM | POA: Diagnosis not present

## 2017-02-01 DIAGNOSIS — E1165 Type 2 diabetes mellitus with hyperglycemia: Secondary | ICD-10-CM | POA: Diagnosis not present

## 2017-03-08 DIAGNOSIS — E781 Pure hyperglyceridemia: Secondary | ICD-10-CM | POA: Diagnosis not present

## 2017-03-08 DIAGNOSIS — I10 Essential (primary) hypertension: Secondary | ICD-10-CM | POA: Diagnosis not present

## 2017-03-08 DIAGNOSIS — E1165 Type 2 diabetes mellitus with hyperglycemia: Secondary | ICD-10-CM | POA: Diagnosis not present

## 2017-03-08 DIAGNOSIS — N182 Chronic kidney disease, stage 2 (mild): Secondary | ICD-10-CM | POA: Diagnosis not present

## 2017-03-23 DIAGNOSIS — E1165 Type 2 diabetes mellitus with hyperglycemia: Secondary | ICD-10-CM | POA: Diagnosis not present

## 2017-04-25 DIAGNOSIS — Z6833 Body mass index (BMI) 33.0-33.9, adult: Secondary | ICD-10-CM | POA: Diagnosis not present

## 2017-04-25 DIAGNOSIS — N182 Chronic kidney disease, stage 2 (mild): Secondary | ICD-10-CM | POA: Diagnosis not present

## 2017-04-25 DIAGNOSIS — I10 Essential (primary) hypertension: Secondary | ICD-10-CM | POA: Diagnosis not present

## 2017-04-25 DIAGNOSIS — E1165 Type 2 diabetes mellitus with hyperglycemia: Secondary | ICD-10-CM | POA: Diagnosis not present

## 2017-06-07 DIAGNOSIS — Z1389 Encounter for screening for other disorder: Secondary | ICD-10-CM | POA: Diagnosis not present

## 2017-06-07 DIAGNOSIS — E781 Pure hyperglyceridemia: Secondary | ICD-10-CM | POA: Diagnosis not present

## 2017-06-07 DIAGNOSIS — N182 Chronic kidney disease, stage 2 (mild): Secondary | ICD-10-CM | POA: Diagnosis not present

## 2017-06-07 DIAGNOSIS — C61 Malignant neoplasm of prostate: Secondary | ICD-10-CM | POA: Diagnosis not present

## 2017-06-07 DIAGNOSIS — E119 Type 2 diabetes mellitus without complications: Secondary | ICD-10-CM | POA: Diagnosis not present

## 2017-06-12 DIAGNOSIS — H35712 Central serous chorioretinopathy, left eye: Secondary | ICD-10-CM | POA: Diagnosis not present

## 2017-06-12 DIAGNOSIS — E119 Type 2 diabetes mellitus without complications: Secondary | ICD-10-CM | POA: Diagnosis not present

## 2017-06-12 DIAGNOSIS — H35711 Central serous chorioretinopathy, right eye: Secondary | ICD-10-CM | POA: Diagnosis not present

## 2017-06-12 DIAGNOSIS — H2511 Age-related nuclear cataract, right eye: Secondary | ICD-10-CM | POA: Diagnosis not present

## 2017-08-21 DIAGNOSIS — H35712 Central serous chorioretinopathy, left eye: Secondary | ICD-10-CM | POA: Diagnosis not present

## 2017-08-21 DIAGNOSIS — H35711 Central serous chorioretinopathy, right eye: Secondary | ICD-10-CM | POA: Diagnosis not present

## 2017-08-21 DIAGNOSIS — E113393 Type 2 diabetes mellitus with moderate nonproliferative diabetic retinopathy without macular edema, bilateral: Secondary | ICD-10-CM | POA: Diagnosis not present

## 2017-08-21 DIAGNOSIS — H2511 Age-related nuclear cataract, right eye: Secondary | ICD-10-CM | POA: Diagnosis not present

## 2017-09-19 DIAGNOSIS — H2511 Age-related nuclear cataract, right eye: Secondary | ICD-10-CM | POA: Diagnosis not present

## 2017-09-19 DIAGNOSIS — H35711 Central serous chorioretinopathy, right eye: Secondary | ICD-10-CM | POA: Diagnosis not present

## 2017-09-19 DIAGNOSIS — H04202 Unspecified epiphora, left lacrimal gland: Secondary | ICD-10-CM | POA: Diagnosis not present

## 2017-09-19 DIAGNOSIS — H35712 Central serous chorioretinopathy, left eye: Secondary | ICD-10-CM | POA: Diagnosis not present

## 2017-10-30 DIAGNOSIS — I10 Essential (primary) hypertension: Secondary | ICD-10-CM | POA: Diagnosis not present

## 2017-10-30 DIAGNOSIS — E1165 Type 2 diabetes mellitus with hyperglycemia: Secondary | ICD-10-CM | POA: Diagnosis not present

## 2017-10-30 DIAGNOSIS — Z794 Long term (current) use of insulin: Secondary | ICD-10-CM | POA: Insufficient documentation

## 2017-10-30 DIAGNOSIS — C61 Malignant neoplasm of prostate: Secondary | ICD-10-CM | POA: Diagnosis not present

## 2017-10-30 DIAGNOSIS — N182 Chronic kidney disease, stage 2 (mild): Secondary | ICD-10-CM | POA: Diagnosis not present

## 2017-12-19 DIAGNOSIS — E113393 Type 2 diabetes mellitus with moderate nonproliferative diabetic retinopathy without macular edema, bilateral: Secondary | ICD-10-CM | POA: Diagnosis not present

## 2017-12-19 DIAGNOSIS — H2511 Age-related nuclear cataract, right eye: Secondary | ICD-10-CM | POA: Diagnosis not present

## 2017-12-19 DIAGNOSIS — H35712 Central serous chorioretinopathy, left eye: Secondary | ICD-10-CM | POA: Diagnosis not present

## 2017-12-19 DIAGNOSIS — H35711 Central serous chorioretinopathy, right eye: Secondary | ICD-10-CM | POA: Diagnosis not present

## 2017-12-29 DIAGNOSIS — H35712 Central serous chorioretinopathy, left eye: Secondary | ICD-10-CM | POA: Diagnosis not present

## 2017-12-29 DIAGNOSIS — E113393 Type 2 diabetes mellitus with moderate nonproliferative diabetic retinopathy without macular edema, bilateral: Secondary | ICD-10-CM | POA: Diagnosis not present

## 2017-12-29 DIAGNOSIS — H35711 Central serous chorioretinopathy, right eye: Secondary | ICD-10-CM | POA: Diagnosis not present

## 2018-01-08 DIAGNOSIS — R112 Nausea with vomiting, unspecified: Secondary | ICD-10-CM | POA: Diagnosis not present

## 2018-01-08 DIAGNOSIS — K529 Noninfective gastroenteritis and colitis, unspecified: Secondary | ICD-10-CM | POA: Diagnosis not present

## 2018-01-17 ENCOUNTER — Ambulatory Visit (HOSPITAL_COMMUNITY)
Admission: EM | Admit: 2018-01-17 | Discharge: 2018-01-17 | Disposition: A | Discharge status: Home / Self Care | Payer: BLUE CROSS/BLUE SHIELD | Source: Home / Self Care | Attending: Family Medicine | Admitting: Family Medicine

## 2018-01-17 ENCOUNTER — Encounter (HOSPITAL_COMMUNITY): Payer: Self-pay | Admitting: Emergency Medicine

## 2018-01-17 ENCOUNTER — Emergency Department (HOSPITAL_COMMUNITY): Payer: BLUE CROSS/BLUE SHIELD

## 2018-01-17 ENCOUNTER — Other Ambulatory Visit: Payer: Self-pay

## 2018-01-17 ENCOUNTER — Emergency Department (HOSPITAL_COMMUNITY)
Admission: EM | Admit: 2018-01-17 | Discharge: 2018-01-17 | Disposition: A | Discharge status: Home / Self Care | Payer: BLUE CROSS/BLUE SHIELD | Attending: Emergency Medicine | Admitting: Emergency Medicine

## 2018-01-17 DIAGNOSIS — R1110 Vomiting, unspecified: Secondary | ICD-10-CM | POA: Diagnosis not present

## 2018-01-17 DIAGNOSIS — K760 Fatty (change of) liver, not elsewhere classified: Secondary | ICD-10-CM

## 2018-01-17 DIAGNOSIS — E119 Type 2 diabetes mellitus without complications: Secondary | ICD-10-CM | POA: Insufficient documentation

## 2018-01-17 DIAGNOSIS — I10 Essential (primary) hypertension: Secondary | ICD-10-CM | POA: Insufficient documentation

## 2018-01-17 DIAGNOSIS — R918 Other nonspecific abnormal finding of lung field: Secondary | ICD-10-CM | POA: Diagnosis not present

## 2018-01-17 DIAGNOSIS — R109 Unspecified abdominal pain: Secondary | ICD-10-CM | POA: Diagnosis not present

## 2018-01-17 DIAGNOSIS — Z794 Long term (current) use of insulin: Secondary | ICD-10-CM | POA: Diagnosis not present

## 2018-01-17 DIAGNOSIS — N289 Disorder of kidney and ureter, unspecified: Secondary | ICD-10-CM

## 2018-01-17 DIAGNOSIS — Z87891 Personal history of nicotine dependence: Secondary | ICD-10-CM | POA: Diagnosis not present

## 2018-01-17 DIAGNOSIS — N2889 Other specified disorders of kidney and ureter: Secondary | ICD-10-CM | POA: Diagnosis not present

## 2018-01-17 DIAGNOSIS — R531 Weakness: Secondary | ICD-10-CM | POA: Insufficient documentation

## 2018-01-17 DIAGNOSIS — R112 Nausea with vomiting, unspecified: Secondary | ICD-10-CM | POA: Insufficient documentation

## 2018-01-17 DIAGNOSIS — R1033 Periumbilical pain: Secondary | ICD-10-CM | POA: Diagnosis not present

## 2018-01-17 LAB — LIPASE, BLOOD: Lipase: 24 U/L (ref 11–51)

## 2018-01-17 LAB — URINALYSIS, ROUTINE W REFLEX MICROSCOPIC
BILIRUBIN URINE: NEGATIVE
Bacteria, UA: NONE SEEN
Ketones, ur: 20 mg/dL — AB
Leukocytes, UA: NEGATIVE
Nitrite: NEGATIVE
PH: 5 (ref 5.0–8.0)
Protein, ur: NEGATIVE mg/dL
SPECIFIC GRAVITY, URINE: 1.023 (ref 1.005–1.030)

## 2018-01-17 LAB — COMPREHENSIVE METABOLIC PANEL
ALK PHOS: 65 U/L (ref 38–126)
ALT: 17 U/L (ref 17–63)
AST: 14 U/L — AB (ref 15–41)
Albumin: 3.8 g/dL (ref 3.5–5.0)
Anion gap: 13 (ref 5–15)
BUN: 7 mg/dL (ref 6–20)
CALCIUM: 9.2 mg/dL (ref 8.9–10.3)
CO2: 27 mmol/L (ref 22–32)
CREATININE: 0.91 mg/dL (ref 0.61–1.24)
Chloride: 97 mmol/L — ABNORMAL LOW (ref 101–111)
Glucose, Bld: 129 mg/dL — ABNORMAL HIGH (ref 65–99)
Potassium: 3.8 mmol/L (ref 3.5–5.1)
SODIUM: 137 mmol/L (ref 135–145)
Total Bilirubin: 1.1 mg/dL (ref 0.3–1.2)
Total Protein: 7.4 g/dL (ref 6.5–8.1)

## 2018-01-17 LAB — CBC
HCT: 42.3 % (ref 39.0–52.0)
Hemoglobin: 13.9 g/dL (ref 13.0–17.0)
MCH: 29.3 pg (ref 26.0–34.0)
MCHC: 32.9 g/dL (ref 30.0–36.0)
MCV: 89.1 fL (ref 78.0–100.0)
PLATELETS: 220 10*3/uL (ref 150–400)
RBC: 4.75 MIL/uL (ref 4.22–5.81)
RDW: 12.9 % (ref 11.5–15.5)
WBC: 4.8 10*3/uL (ref 4.0–10.5)

## 2018-01-17 LAB — I-STAT TROPONIN, ED: Troponin i, poc: 0.01 ng/mL (ref 0.00–0.08)

## 2018-01-17 LAB — POC OCCULT BLOOD, ED: Fecal Occult Bld: NEGATIVE

## 2018-01-17 MED ORDER — METOCLOPRAMIDE HCL 10 MG PO TABS
5.0000 mg | ORAL_TABLET | Freq: Once | ORAL | Status: AC
  Administered 2018-01-17: 5 mg via ORAL
  Filled 2018-01-17: qty 1

## 2018-01-17 MED ORDER — METOCLOPRAMIDE HCL 10 MG PO TABS: 10.0000 mg | ORAL_TABLET | Freq: Four times a day (QID) | ORAL | 0 refills | Status: DC

## 2018-01-17 MED ORDER — IOHEXOL 300 MG/ML  SOLN
100.0000 mL | Freq: Once | INTRAMUSCULAR | Status: AC | PRN
  Administered 2018-01-17: 100 mL via INTRAVENOUS

## 2018-01-17 NOTE — ED Triage Notes (Signed)
Pt. Stated, George Franco had N/V with some off and on stomach pain for the last 3 weeks.

## 2018-01-17 NOTE — ED Provider Notes (Signed)
[place] EMERGENCY DEPARTMENT Provider Note   CSN: [id number] Arrival date & time: 01/17/18  1022     History   Chief Complaint Chief Complaint  Patient presents with  . Abdominal Pain  . Emesis  . Nausea    HPI George Franco is a 62 y.o. male.  HPI   Patient is a 62 year old male with a history of type 2 diabetes mellitus (on insulin), prostate cancer (treatment completed), hyperlipidemia, and hypertension presenting for periumbilical abdominal pain, emesis, generalized weakness and dizziness.  Patient reports his symptoms began 3 weeks ago, suddenly.  Patient reports that the emesis began first after a sandwich.  Patient reports that subsequently whenever he tries to eat solid food, he experiences early satiety and lack of appetite.  Patient reports that he does not vomit every day, but will vomit up food without hematemesis or bilious vomiting every couple days over the past 3 weeks.  Patient reports that when his symptoms first began he was having persistent periumbilical pain, however it is now more intermittent in nature with the last episode yesterday evening.  Patient denies association of the periumbilical pain with eating.  Patient denies any fevers or chills.  Patient denies any chest pain, shortness breath, or syncopal episodes.  Patient reports last bowel movement 2.5 weeks ago, however he does persistently passed flatus most recently today.  Patient reports that he has had both upper endoscopy and colonoscopy, but unsure last date.  Past Medical History:  Diagnosis Date  . At risk for sleep apnea    STOP-BANG= 5     SENT TO PCP 06-02-2015  . BPH (benign prostatic hyperplasia)   . ED (erectile dysfunction) of organic origin   . Hyperlipidemia   . Hypertension   . Hypogonadism in male   . Lower urinary tract symptoms (LUTS)   . LVH (left ventricular hypertrophy)   . Prostate cancer [facility]) urologist-  dr ottelin/  oncologist-  [name]   Stage  T1c, Gleason 4+3, PSA 4.48,  vol 32.9cc--  External RXT complete 05-08-2015  . Type 2 diabetes mellitus ([place])   . Wears glasses     Patient Active Problem List   Diagnosis Date Noted  . Malignant neoplasm of prostate ([place]) 01/26/2015  . Dizziness and giddiness 11/18/2014  . Essential hypertension, benign 11/18/2014    Past Surgical History:  Procedure Laterality Date  . PROSTATE BIOPSY    . RADIOACTIVE SEED IMPLANT N/A 06/05/2015   Procedure: RADIOACTIVE SEED IMPLANT/BRACHYTHERAPY IMPLANT;  Surgeon: [name], MD;  Location: [place];  Service: Urology;  Laterality: N/A;  . ROTATOR CUFF REPAIR Right 2014  . UMBILICAL HERNIA REPAIR  1990's        Home Medications    Prior to Admission medications   Medication Sig Start Date End Date Taking? Authorizing Provider  benazepril (LOTENSIN) 40 MG tablet Take 40 mg by mouth daily.   Yes [provider]  FARXIGA 10 MG TABS tablet Take 10 mg by mouth daily. 12/29/17  Yes [provider]  Insulin Degludec (TRESIBA) 100 UNIT/ML SOLN Inject 20 Units into the skin daily.   Yes [provider]  loperamide (IMODIUM) 2 MG capsule Take 2 mg by mouth every 6 (six) hours as needed for constipation. 01/08/18  Yes [provider]  NOVOLOG FLEXPEN 100 UNIT/ML FlexPen Inject 20 Units into the skin 3 (three) times daily. 12/12/17  Yes [provider]  ondansetron (ZOFRAN-ODT) 4 MG disintegrating tablet Take 4 mg  by mouth every 4 (four) hours as needed for nausea/vomiting. 01/08/18  Yes [provider]  HYDROcodone-acetaminophen (NORCO/VICODIN) 5-325 MG tablet Take 1-2 tablets by mouth 3 (three) times daily. Patient not taking: Reported on 04/13/2016 02/05/16   Frederica Kuster, PA-C    Family History Family History  Problem Relation Age of Onset  . Prostate cancer Father   . Cancer Father        prostate  . Heart failure Mother   . Diabetes Mother     Social History Social  History   Tobacco Use  . Smoking status: Former Smoker    Packs/day: 1.00    Years: 20.00    Pack years: 20.00    Types: Cigarettes    Last attempt to quit: 06/01/2014    Years since quitting: 3.6  . Smokeless tobacco: Never Used  Substance Use Topics  . Alcohol use: No    Alcohol/week: 0.0 oz  . Drug use: No     Allergies   Patient has no known allergies.   Review of Systems Review of Systems  Constitutional: Negative for chills and fever.  HENT: Negative for congestion, rhinorrhea, sinus pain and sore throat.   Eyes: Negative for visual disturbance.  Respiratory: Negative for cough, chest tightness and shortness of breath.   Cardiovascular: Negative for chest pain, palpitations and leg swelling.  Gastrointestinal: Positive for abdominal pain, constipation, nausea and vomiting.  Genitourinary: Negative for dysuria and flank pain.  Musculoskeletal: Negative for back pain and myalgias.  Skin: Negative for rash.  Neurological: Positive for dizziness, weakness and light-headedness. Negative for syncope and headaches.       + Generalized weakness     Physical Exam Updated Vital Signs BP 137/88   Pulse 73   Temp 98.8 F (37.1 C) (Oral)   Resp 17   Ht 5\' 9"  (1.753 m)   Wt 97.5 kg (215 lb)   SpO2 97%   BMI 31.75 kg/m   Physical Exam  Constitutional: He appears well-developed and well-nourished. No distress.  HENT:  Head: Normocephalic and atraumatic.  Mouth/Throat: Oropharynx is clear and moist.  Eyes: Pupils are equal, round, and reactive to light. Conjunctivae and EOM are normal.  Neck: Normal range of motion. Neck supple.  Cardiovascular: Normal rate, regular rhythm, S1 normal and S2 normal.  No murmur heard. Pulmonary/Chest: Effort normal and breath sounds normal. He has no wheezes. He has no rales.  Abdominal: Soft. Bowel sounds are normal. He exhibits no distension. There is no tenderness. There is no guarding.  Genitourinary:  Genitourinary Comments:  Rectal exam performed with RN chaperone present.  There are no thrombosed external hemorrhoids or prolapsed internal hemorrhoids.  Patient exhibits  circumferential thickening of the rectum without fluctuance or induration.  No masses of the rectum.  Soft brown stool present in rectal vault.  Musculoskeletal: Normal range of motion. He exhibits no edema or deformity.  Lymphadenopathy:    He has no cervical adenopathy.  Neurological: He is alert.  Cranial nerves grossly intact. Patient moves extremities symmetrically and with good coordination.  Skin: Skin is warm and dry. No rash noted. No erythema.  Psychiatric: He has a normal mood and affect. His behavior is normal. Judgment and thought content normal.  Nursing note and vitals reviewed.    ED Treatments / Results  Labs (all labs ordered are listed, but only abnormal results are displayed) Labs Reviewed  COMPREHENSIVE METABOLIC PANEL - Abnormal; Notable for the following components:  Result Value   Chloride 97 (*)    Glucose, Bld 129 (*)    AST 14 (*)    All other components within normal limits  URINALYSIS, ROUTINE W REFLEX MICROSCOPIC - Abnormal; Notable for the following components:   Glucose, UA >=500 (*)    Hgb urine dipstick MODERATE (*)    Ketones, ur 20 (*)    All other components within normal limits  LIPASE, BLOOD  CBC  OCCULT BLOOD X 1 CARD TO LAB, STOOL  I-STAT TROPONIN, ED    EKG EKG Interpretation  Date/Time:  Wednesday January 17 2018 13:59:19 EDT Ventricular Rate:  78 PR Interval:    QRS Duration: 94 QT Interval:  380 QTC Calculation: 433 R Axis:   23 Text Interpretation:  Sinus rhythm Abnormal R-wave progression, early transition Baseline wander in lead(s) V3 since last tracing no significant change Confirmed by Daleen Bo [phone]) on 01/17/2018 2:19:51 PM   Radiology Ct Abdomen Pelvis W Contrast  Result Date: 01/17/2018 CLINICAL DATA:  Lower abdominal pain for several weeks with nausea and  vomiting EXAM: CT ABDOMEN AND PELVIS WITH CONTRAST TECHNIQUE: Multidetector CT imaging of the abdomen and pelvis was performed using the standard protocol following bolus administration of intravenous contrast. CONTRAST:  18mL OMNIPAQUE IOHEXOL 300 MG/ML  SOLN COMPARISON:  05/29/2012 FINDINGS: Lower chest: Lung bases are well aerated. Some ground-glass density is noted in the medial aspect of the right lower lobe best seen on image number 19 of series 4. This is likely postinflammatory in nature. Hepatobiliary: Fatty infiltration of the liver is noted. The gallbladder is within normal limits. Pancreas: Unremarkable. No pancreatic ductal dilatation or surrounding inflammatory changes. Spleen: Normal in size without focal abnormality. Adrenals/Urinary Tract: Adrenal glands are within normal limits bilaterally. Hypodensities are noted bilaterally within the kidneys some of which represent cysts. Some of these cannot be definitively cleared as cysts particularly on image number 16 of series 8 in the left kidney. No obstructive changes are seen. No renal calculi are noted. The bladder is within normal limits. Stomach/Bowel: No obstructive or inflammatory changes of the bowel are seen. The appendix is within normal limits. Vascular/Lymphatic: Aortic atherosclerosis. No enlarged abdominal or pelvic lymph nodes. Reproductive: Prostate shows radioactive seed placement Other: No abdominal wall hernia or abnormality. No abdominopelvic ascites. Musculoskeletal: Degenerative changes of lumbar spine are seen. Stable sclerotic lesion is noted in the left iliac bone likely representing a bone island. No other sclerotic foci are seen. IMPRESSION: Hypodensities within the kidneys which cannot be completely classified as cysts. MRI with contrast material on a nonemergent basis (to allow for optimum imaging) is recommended for further evaluation. Ground-glass density in the right lower lobe. Initial follow-up with CT at 6-12 months is  recommended to confirm persistence. If persistent, repeat CT is recommended every 2 years until 5 years of stability has been established. This recommendation follows the consensus statement: Guidelines for Management of Incidental Pulmonary Nodules Detected on CT Images: From the Fleischner Society 2017; Radiology 2017; 284:228-243. Fatty infiltration of the liver. Electronically Signed   By: Inez Catalina M.D.   On: 01/17/2018 15:48    Procedures Procedures (including critical care time)  Medications Ordered in ED Medications  metoCLOPramide (REGLAN) tablet 5 mg (has no administration in time range)     Initial Impression / Assessment and Plan / ED Course  I have reviewed the triage vital signs and the nursing notes.  Pertinent labs & imaging results that were available during my care of the  patient were reviewed by me and considered in my medical decision making (see chart for details).     Patient is nontoxic-appearing, afebrile, and in no acute distress.  Patient  is without abdominal tenderness at present.  Differential diagnosis includes diabetic gastroparesis, gastritis, DKA, peptic ulcer disease, chronic mesenteric ischemia, ACS, small bowel obstruction.  Laboratory work-up unremarkable for renal or hepatic abnormalities.  Hemoglobin normal.  There is no anion gap.  Given the patient had periumbilical epigastric pain greater than 12 hours ago, feel that a single troponin is sufficient, which was negative.  EKG unchanged from prior without evidence of ischemia, infarction, or arrhythmia.  CT of the abdomen and pelvis demonstrates no evidence of obstruction or perirectal thickening, but does note incidental findings of hypodensities within the kidneys they cannot be completely identified as cyst, and nonemergent MRI recommended.  Patient also noted to have groundglass opacity in the right lobe, and follow-up CT recommended in 6 to 12 months.  Patient was informed of these results.  History  is not consistent with chronic mesenteric ischemia, as patient has no postprandial pain, and it can come at random times.  Patient given outpatient prescription for Reglan for possible diabetic gastroparesis, given ambulatory referral for the gastroenterology.  Final Clinical Impressions(s) / ED Diagnoses   Final diagnoses:  Non-intractable vomiting with nausea, unspecified vomiting type  Generalized weakness  Kidney lesion  Ground glass opacity present on imaging of lung  Elevated blood pressure reading in office with diagnosis of hypertension  Fatty infiltration of liver    ED Discharge Orders        Ordered    Ambulatory referral to Gastroenterology    Comments:  Patient experiencing early satiety as well as nausea vomiting.  CT abdomen and pelvis without acute abnormality of the GI tract in ED. Patient is a diabetic.  Last colonoscopy unknown to patient, but states he has had both upper GI and colonoscopy within 10 years.  No GI records to verify. Thank you so much for caring for this patient.   01/17/18 1647    metoCLOPramide (REGLAN) 10 MG tablet  Every 6 hours     01/17/18 1647       Tamala Julian 01/17/18 1701    Daleen Bo, MD 01/17/18 2236

## 2018-01-17 NOTE — ED Notes (Signed)
Meter not interfacing with Epic.  POC OCCULT BLOOD WAS NEGATIVE.

## 2018-01-17 NOTE — ED Triage Notes (Signed)
Pt decided to go to ED for further eval of abd pain, hematemesis and dizziness

## 2018-01-17 NOTE — Discharge Instructions (Signed)
Please see the information and instructions below regarding your visit.  Your diagnoses today include:    Tests performed today include: See side panel of your discharge paperwork for testing performed today. Vital signs are listed at the bottom of these instructions.   Please refer to the CT report I gave you for results of your CT.  There were no obstruction seen in your GI tract.  You do have some spots on your kidneys any to be followed up with an MRI outpatient.  You also have a spot on your right lower lung that will require a repeat CT scan of the chest in 6 to 12 months per your primary doctor.  Medications prescribed:    Take any prescribed medications only as prescribed, and any over the counter medications only as directed on the packaging.  You are prescribed Reglan, which may be taken every 6 hours as needed for nausea and vomiting.  Home care instructions:  Please follow any educational materials contained in this packet.   Follow-up instructions: Please follow-up with your primary care provider in 5 days for further evaluation of your symptoms if they are not completely improved.   Please follow up with low-power GI as soon as possible.  Return instructions:  Please return to the Emergency Department if you experience worsening symptoms.  Please return to the emergency department if you develop any worsening, severe, or constant abdominal pain, abdominal pain that is focal in nature, fevers or chills with abdominal pain, nausea or vomiting that prevents you from keeping anything down, chest pain, shortness of breath, or feeling that you are going to pass out. Please return if you have any other emergent concerns.  Additional Information:   Your vital signs today were: BP (!) 161/71    Pulse 67    Temp 98.8 F (37.1 C) (Oral)    Resp 18    Ht 5\' 9"  (1.753 m)    Wt 97.5 kg (215 lb)    SpO2 96%    BMI 31.75 kg/m  If your blood pressure (BP) was elevated on multiple  readings during this visit above 130 for the top number or above 80 for the bottom number, please have this repeated by your primary care provider within one month. --------------  Thank you for allowing [place] to participate in your care today.

## 2018-01-19 ENCOUNTER — Encounter: Payer: Self-pay | Admitting: Internal Medicine

## 2018-01-26 ENCOUNTER — Ambulatory Visit (INDEPENDENT_AMBULATORY_CARE_PROVIDER_SITE_OTHER): Payer: BLUE CROSS/BLUE SHIELD | Admitting: Internal Medicine

## 2018-01-26 ENCOUNTER — Encounter: Payer: Self-pay | Admitting: Internal Medicine

## 2018-01-26 VITALS — BP 120/76 | HR 90 | Ht 68.0 in | Wt 209.1 lb

## 2018-01-26 DIAGNOSIS — R1033 Periumbilical pain: Secondary | ICD-10-CM

## 2018-01-26 DIAGNOSIS — R112 Nausea with vomiting, unspecified: Secondary | ICD-10-CM | POA: Diagnosis not present

## 2018-01-26 DIAGNOSIS — R634 Abnormal weight loss: Secondary | ICD-10-CM | POA: Diagnosis not present

## 2018-01-26 DIAGNOSIS — R918 Other nonspecific abnormal finding of lung field: Secondary | ICD-10-CM | POA: Diagnosis not present

## 2018-01-26 NOTE — Progress Notes (Signed)
Brighton Pilley 62 y.o. May 28, 1956 [id number]  Assessment & Plan:   Encounter Diagnoses  Name Primary?  . Loss of weight Yes  . Periumbilical abdominal pain   . Non-intractable vomiting with nausea, unspecified vomiting type   . Abnormal CT scan of lung - RLL ground glass density     The cause of these problems is not clear.  CT scanning and labs on June 12 are all reassuring.  He is probably somewhat better but remains symptomatic.  I suppose it is possible that interruption of diabetic medications and hyperglycemia could have caused these problems but I am surprised that he has persistent symptoms after returning to his medications based upon the amount of time he said he was off therapy.  If he was persistently hyperglycemic he could have lost weight from that.  I think an investigation with an upper GI endoscopy makes sense.  I am unable to do that soon but Dr. Lyndel Safe my partner has graciously agreed to do this and the patient will have this done on Monday, June 24.  He is not on a PPI yet, so H. pylori testing would be accurate at EGD, he may need a PPI after the EGD empirically or based upon findings.  The risks and benefits as well as alternatives of endoscopic procedure(s) have been discussed and reviewed. All questions answered. The patient agrees to proceed.  I suspect a screening colonoscopy is appropriate at some point this year, though it does sound like he does yearly I FOBT tests through his primary care provider and they have been negative and at some point he may have had a colonoscopy.  I do not think the abnormal right lower lobe lung finding on CT scan is related but this will need follow-up through primary care.  I appreciate the opportunity to care for this patient. CC: [name], MD  Subjective:   Chief Complaint: Abdominal pain nausea vomiting weight loss  HPI The patient is a very nice 62 year old African-American man who 3 weeks or so ago had acute  onset of periumbilical pain, it occurred after he ate a breakfast sandwich and then he had vomiting at work and was sent to an urgent care.  He was prescribed some empiric medications there but he had persistent problems so he went to the emergency department on June 12.  He was evaluated with labs and a CT scan see below. FOBT neg CBC, CMET and lipase NL He was given a prescription for metoclopramide.  Ondansetron as well.  Loperamide as needed.  IMPRESSION: Hypodensities within the kidneys which cannot be completely classified as cysts. MRI with contrast material on a nonemergent basis (to allow for optimum imaging) is recommended for further evaluation.  Ground-glass density in the right lower lobe. Initial follow-up with CT at 6-12 months is recommended to confirm persistence. If persistent, repeat CT is recommended every 2 years until 5 years of stability has been established. This recommendation follows the consensus statement: Guidelines for Management of Incidental Pulmonary Nodules Detected on CT Images: From the Fleischner Society 2017; Radiology 2017; 284:228-243.  Fatty infiltration of the liver.   Electronically Signed   By: Inez Catalina M.D.   On: 01/17/2018 15:48  He is describing loss of appetite some weight loss, and periumbilical pain that comes and goes.  He was out of his diabetes medicines for about 3 weeks prior to this he said it is higher or highest blood sugar was about 180.  He is back on  his meds.  There was some glitch with the insurance approval etc. even his coworkers had that problem.  He has not had fever.  He has been nauseous he has not vomited in a while and his stools though he had some loose stools and then no stools for several days are now back to normal.  He is almost afraid to eat.  He believes he had an EGD for something similar but not nearly as severe a number of years ago though he describes going to radiology clinic so I am not sure if it  was an upper GI plus or minus barium enema versus colonoscopy and endoscopy.  The only new medicines or what were prescribed in the emergency department.  Note that he says he is never really felt this sick.  He is very concerned about the way he feels he has not been to work though he is hoping to try to go back to work soon.  He has been instructed to file FMLA paperwork by human resources. Wt Readings from Last 3 Encounters:  01/26/18 209 lb 2 oz (94.9 kg)  01/17/18 215 lb (97.5 kg)  04/13/16 245 lb (111.1 kg)    No Known Allergies Current Meds  Medication Sig  . benazepril (LOTENSIN) 40 MG tablet Take 40 mg by mouth daily.  Marland Kitchen FARXIGA 10 MG TABS tablet Take 10 mg by mouth daily.  . Insulin Degludec (TRESIBA) 100 UNIT/ML SOLN Inject 20 Units into the skin daily.  Marland Kitchen loperamide (IMODIUM) 2 MG capsule Take 2 mg by mouth every 6 (six) hours as needed for constipation.  . metoCLOPramide (REGLAN) 10 MG tablet Take 1 tablet (10 mg total) by mouth every 6 (six) hours.  Marland Kitchen NOVOLOG FLEXPEN 100 UNIT/ML FlexPen Inject 20 Units into the skin 3 (three) times daily.  . ondansetron (ZOFRAN-ODT) 4 MG disintegrating tablet Take 4 mg by mouth every 4 (four) hours as needed for nausea/vomiting.   Past Medical History:  Diagnosis Date  . At risk for sleep apnea    STOP-BANG= 5     SENT TO PCP 06-02-2015  . BPH (benign prostatic hyperplasia)   . ED (erectile dysfunction) of organic origin   . Hyperlipidemia   . Hypertension   . Hypogonadism in male   . Lower urinary tract symptoms (LUTS)   . LVH (left ventricular hypertrophy)   . Prostate cancer [facility]) urologist-  dr ottelin/  oncologist-  [name]   Stage T1c, Gleason 4+3, PSA 4.48,  vol 32.9cc--  External RXT complete 05-08-2015  . Type 2 diabetes mellitus ([place])   . Wears glasses    Past Surgical History:  Procedure Laterality Date  . PROSTATE BIOPSY    . RADIOACTIVE SEED IMPLANT N/A 06/05/2015   Procedure: RADIOACTIVE SEED  IMPLANT/BRACHYTHERAPY IMPLANT;  Surgeon: [name], MD;  Location: [place];  Service: Urology;  Laterality: N/A;  . ROTATOR CUFF REPAIR Right 2014  . 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   Review of Systems As per HPI.  Some history of headaches insomnia and fatigue.  All other review of systems are negative. I Objective:  Physical Exam @BP  120/76 (BP Location: Left Arm, Patient Position: Sitting, Cuff Size: Normal)   Pulse 90   Ht 5\' 8"  (1.727 m) Comment: height measured without shoes  Wt 209 lb 2 oz (94.9 kg)   BMI 31.80 kg/m @  General:  Well-developed, well-nourished and in no acute distress Eyes:  anicteric. ENT:   Mouth and posterior pharynx free of lesions.  Neck:   supple w/o thyromegaly or mass.  Lungs: Clear to auscultation bilaterally. Heart:  S1S2, no rubs, murmurs, gallops. Abdomen: Obese soft, non-tender, no hepatosplenomegaly, hernia, or mass and BS+.  Lymph:  no cervical or supraclavicular adenopathy. Extremities:   no edema, cyanosis or clubbing Skin   no rash. Neuro:  A&O x 3.  Psych:  appropriate mood and  Affect.   Data Reviewed: See HPI

## 2018-01-26 NOTE — Patient Instructions (Signed)
If you are age 60 or older, your body mass index should be between 23-30. Your Body mass index is 31.8 kg/m. If this is out of the aforementioned range listed, please consider follow up with your Primary Care Provider.  If you are age 10 or younger, your body mass index should be between 19-25. Your Body mass index is 31.8 kg/m. If this is out of the aformentioned range listed, please consider follow up with your Primary Care Provider.   You have been scheduled for an endoscopy. Please follow written instructions given to you at your visit today. If you use inhalers (even only as needed), please bring them with you on the day of your procedure. Your physician has requested that you go to www.startemmi.com and enter the access code given to you at your visit today. This web site gives a general overview about your procedure. However, you should still follow specific instructions given to you by our office regarding your preparation for the procedure.  Thank you for choosing me and [facility].  Gatha Mayer, MD, [facility]

## 2018-01-29 ENCOUNTER — Encounter: Payer: Self-pay | Admitting: Gastroenterology

## 2018-01-29 ENCOUNTER — Ambulatory Visit (AMBULATORY_SURGERY_CENTER): Payer: BLUE CROSS/BLUE SHIELD | Admitting: Gastroenterology

## 2018-01-29 ENCOUNTER — Other Ambulatory Visit: Payer: Self-pay

## 2018-01-29 VITALS — BP 99/69 | HR 104 | Temp 97.7°F | Resp 14 | Ht 68.0 in | Wt 209.0 lb

## 2018-01-29 DIAGNOSIS — K2950 Unspecified chronic gastritis without bleeding: Secondary | ICD-10-CM | POA: Diagnosis not present

## 2018-01-29 DIAGNOSIS — R634 Abnormal weight loss: Secondary | ICD-10-CM | POA: Diagnosis not present

## 2018-01-29 DIAGNOSIS — K2990 Gastroduodenitis, unspecified, without bleeding: Secondary | ICD-10-CM

## 2018-01-29 DIAGNOSIS — K2970 Gastritis, unspecified, without bleeding: Secondary | ICD-10-CM

## 2018-01-29 MED ORDER — SODIUM CHLORIDE 0.9 % IV SOLN: 500.0000 mL | Freq: Once | INTRAVENOUS | Status: DC

## 2018-01-29 MED ORDER — PANTOPRAZOLE SODIUM 40 MG PO TBEC: 40.0000 mg | DELAYED_RELEASE_TABLET | Freq: Every day | ORAL | 3 refills | Status: DC

## 2018-01-29 NOTE — Op Note (Signed)
[place] Patient Name: George Franco Procedure Date: 01/29/2018 1:30 PM MRN: [id number] Endoscopist: Jackquline Denmark , MD Age: 62 Referring MD:  Date of Birth: Apr 11, 1956 Gender: Male Account #: [id number] Procedure:                Upper GI endoscopy Indications:              Periumbilical abdominal pain, weight loss with                            negative CT scan of the abdomen and pelvis Medicines:                Monitored Anesthesia Care Procedure:                Pre-Anesthesia Assessment:                           - Prior to the procedure, a History and Physical                            was performed, and patient medications and                            allergies were reviewed. The patient is competent.                            The risks and benefits of the procedure and the                            sedation options and risks were discussed with the                            patient. All questions were answered and informed                            consent was obtained. Patient identification and                            proposed procedure were verified by the physician                            in the procedure room. Mental Status Examination:                            alert and oriented. Prophylactic Antibiotics: The                            patient does not require prophylactic antibiotics.                            Prior Anticoagulants: The patient has taken no                            previous anticoagulant or antiplatelet agents. ASA  Grade Assessment: II - A patient with mild systemic                            disease. After reviewing the risks and benefits,                            the patient was deemed in satisfactory condition to                            undergo the procedure. The anesthesia plan was to                            use monitored anesthesia care (MAC). Immediately   prior to administration of medications, the patient                            was re-assessed for adequacy to receive sedatives.                            The heart rate, respiratory rate, oxygen                            saturations, blood pressure, adequacy of pulmonary                            ventilation, and response to care were monitored                            throughout the procedure. The physical status of                            the patient was re-assessed after the procedure.                           After obtaining informed consent, the endoscope was                            passed under direct vision. Throughout the                            procedure, the patient's blood pressure, pulse, and                            oxygen saturations were monitored continuously. The                            Endoscope was introduced through the mouth, and                            advanced to the second part of duodenum. The upper                            GI endoscopy was accomplished without difficulty.  The patient tolerated the procedure well. Scope In: Scope Out: Findings:                 The examined esophagus was normal.                           Diffuse moderate inflammation characterized by                            erythema was found in the stomach mainly involving                            the body of the stomach and the antrum. Biopsies                            were taken with a cold forceps for Helicobacter                            pylori testing.                           Two non-bleeding superficial duodenal ulcers                            measuring 6-8 mm with no stigmata of bleeding were                            found in the first portion of the duodenum. There                            was surrounding mild duodenitis. Complications:            No immediate complications. Estimated Blood Loss:     Estimated blood  loss: none. Impression:               - Acute Gastroduodenitis.                           - Superficial duodenal ulcers with no stigmata of                            bleeding. Recommendation:           - Patient has a contact number available for                            emergencies. The signs and symptoms of potential                            delayed complications were discussed with the                            patient. Return to normal activities tomorrow.                            Written discharge instructions were provided to the  patient.                           - Resume previous diet.                           - Continue present medications.                           - Await pathology results.                           - No ibuprofen, naproxen, or other non-steroidal                            anti-inflammatory drugs.                           - Start Protonix (pantoprazole) 40 mg PO daily.                           - Check weight every week and record the weight.                            Patient is to get in touch with [place] if there is any                            further weight loss. If he continues to have upper                            GI symptoms, then would recommend solid-phase                            gastric emptying scan to rule out gastroparesis.                           - Follow-up in the GI clinic in 12 weeks. Earlier,                            still with problems. Jackquline Denmark, MD 01/29/2018 1:53:43 PM This report has been signed electronically.

## 2018-01-29 NOTE — Patient Instructions (Signed)
NO IBUPROFEN, NAPROXEN, OR OTHER NON-STEROIDAL ANTI INFLAMMATORY DRUGS. CHECK WEIGHT EVERY WEEK. AND RECORD WEIGHT.GET IN TOUCH WITH ANY FURTHER WEIGHT LOSS  YOU HAD AN ENDOSCOPIC PROCEDURE TODAY AT [facility]:   Refer to the procedure report that was given to you for any specific questions about what was found during the examination.  If the procedure report does not answer your questions, please call your gastroenterologist to clarify.  If you requested that your care partner not be given the details of your procedure findings, then the procedure report has been included in a sealed envelope for you to review at your convenience later.  YOU SHOULD EXPECT: Some feelings of bloating in the abdomen. Passage of more gas than usual.  Walking can help get rid of the air that was put into your GI tract during the procedure and reduce the bloating. If you had a lower endoscopy (such as a colonoscopy or flexible sigmoidoscopy) you may notice spotting of blood in your stool or on the toilet paper. If you underwent a bowel prep for your procedure, you may not have a normal bowel movement for a few days.  Please Note:  You might notice some irritation and congestion in your nose or some drainage.  This is from the oxygen used during your procedure.  There is no need for concern and it should clear up in a day or so.  SYMPTOMS TO REPORT IMMEDIATELY:     Following upper endoscopy (EGD)  Vomiting of blood or coffee ground material  New chest pain or pain under the shoulder blades  Painful or persistently difficult swallowing  New shortness of breath  Fever of 100F or higher  Black, tarry-looking stools  For urgent or emergent issues, a gastroenterologist can be reached at any hour by calling [phone].   DIET:  We do recommend a small meal at first, but then you may proceed to your regular diet.  Drink plenty of fluids but you should avoid alcoholic beverages for 24  hours.  ACTIVITY:  You should plan to take it easy for the rest of today and you should NOT DRIVE or use heavy machinery until tomorrow (because of the sedation medicines used during the test).    FOLLOW UP: Our staff will call the number listed on your records the next business day following your procedure to check on you and address any questions or concerns that you may have regarding the information given to you following your procedure. If we do not reach you, we will leave a message.  However, if you are feeling well and you are not experiencing any problems, there is no need to return our call.  We will assume that you have returned to your regular daily activities without incident.  If any biopsies were taken you will be contacted by phone or by letter within the next 1-3 weeks.  Please call us at [phone] if you have not heard about the biopsies in 3 weeks.    SIGNATURES/CONFIDENTIALITY: You and/or your care partner have signed paperwork which will be entered into your electronic medical record.  These signatures attest to the fact that that the information above on your After Visit Summary has been reviewed and is understood.  Full responsibility of the confidentiality of this discharge information lies with you and/or your care-partner.

## 2018-01-29 NOTE — Progress Notes (Signed)
Called to room to assist during endoscopic procedure.  Patient ID and intended procedure confirmed with present staff. Received instructions for my participation in the procedure from the performing physician.  

## 2018-01-29 NOTE — Progress Notes (Signed)
Report given to PACU, vss 

## 2018-01-30 ENCOUNTER — Telehealth: Payer: Self-pay | Admitting: *Deleted

## 2018-01-30 ENCOUNTER — Telehealth: Payer: Self-pay

## 2018-01-30 NOTE — Telephone Encounter (Signed)
  Follow up Call-  Call back number 01/29/2018  Post procedure Call Back phone  # [phone] [place] - wife cell  Permission to leave phone message No  Some recent data might be hidden     No answer, I did not leave a message.

## 2018-01-30 NOTE — Telephone Encounter (Signed)
Unable to reach patient. No message left per patient.

## 2018-02-14 ENCOUNTER — Encounter: Payer: Self-pay | Admitting: Gastroenterology

## 2018-02-27 DIAGNOSIS — E1165 Type 2 diabetes mellitus with hyperglycemia: Secondary | ICD-10-CM | POA: Diagnosis not present

## 2018-02-27 DIAGNOSIS — E7849 Other hyperlipidemia: Secondary | ICD-10-CM | POA: Diagnosis not present

## 2018-02-27 DIAGNOSIS — Z794 Long term (current) use of insulin: Secondary | ICD-10-CM | POA: Diagnosis not present

## 2018-02-27 DIAGNOSIS — I10 Essential (primary) hypertension: Secondary | ICD-10-CM | POA: Diagnosis not present

## 2018-02-27 DIAGNOSIS — N182 Chronic kidney disease, stage 2 (mild): Secondary | ICD-10-CM | POA: Diagnosis not present

## 2018-06-27 DIAGNOSIS — R42 Dizziness and giddiness: Secondary | ICD-10-CM | POA: Diagnosis not present

## 2018-06-27 DIAGNOSIS — N182 Chronic kidney disease, stage 2 (mild): Secondary | ICD-10-CM | POA: Diagnosis not present

## 2018-06-27 DIAGNOSIS — E781 Pure hyperglyceridemia: Secondary | ICD-10-CM | POA: Diagnosis not present

## 2018-06-27 DIAGNOSIS — E1165 Type 2 diabetes mellitus with hyperglycemia: Secondary | ICD-10-CM | POA: Diagnosis not present

## 2018-06-27 DIAGNOSIS — Z794 Long term (current) use of insulin: Secondary | ICD-10-CM | POA: Diagnosis not present

## 2018-06-27 DIAGNOSIS — C61 Malignant neoplasm of prostate: Secondary | ICD-10-CM | POA: Diagnosis not present

## 2018-06-27 DIAGNOSIS — I10 Essential (primary) hypertension: Secondary | ICD-10-CM | POA: Diagnosis not present

## 2018-06-27 DIAGNOSIS — E7849 Other hyperlipidemia: Secondary | ICD-10-CM | POA: Diagnosis not present

## 2018-11-05 DIAGNOSIS — Z794 Long term (current) use of insulin: Secondary | ICD-10-CM | POA: Diagnosis not present

## 2018-11-05 DIAGNOSIS — E119 Type 2 diabetes mellitus without complications: Secondary | ICD-10-CM | POA: Diagnosis not present

## 2018-11-05 DIAGNOSIS — K279 Peptic ulcer, site unspecified, unspecified as acute or chronic, without hemorrhage or perforation: Secondary | ICD-10-CM | POA: Diagnosis not present

## 2018-11-05 DIAGNOSIS — E1165 Type 2 diabetes mellitus with hyperglycemia: Secondary | ICD-10-CM | POA: Diagnosis not present

## 2018-11-14 DIAGNOSIS — E119 Type 2 diabetes mellitus without complications: Secondary | ICD-10-CM | POA: Diagnosis not present

## 2019-03-08 DIAGNOSIS — E1165 Type 2 diabetes mellitus with hyperglycemia: Secondary | ICD-10-CM | POA: Diagnosis not present

## 2019-03-18 DIAGNOSIS — E669 Obesity, unspecified: Secondary | ICD-10-CM | POA: Diagnosis not present

## 2019-03-18 DIAGNOSIS — R42 Dizziness and giddiness: Secondary | ICD-10-CM | POA: Diagnosis not present

## 2019-03-18 DIAGNOSIS — E785 Hyperlipidemia, unspecified: Secondary | ICD-10-CM | POA: Diagnosis not present

## 2019-03-18 DIAGNOSIS — N182 Chronic kidney disease, stage 2 (mild): Secondary | ICD-10-CM | POA: Diagnosis not present

## 2019-03-21 ENCOUNTER — Other Ambulatory Visit: Payer: Self-pay | Admitting: Endocrinology

## 2019-03-21 DIAGNOSIS — R42 Dizziness and giddiness: Secondary | ICD-10-CM

## 2019-03-22 DIAGNOSIS — E1165 Type 2 diabetes mellitus with hyperglycemia: Secondary | ICD-10-CM | POA: Diagnosis not present

## 2019-03-22 DIAGNOSIS — I129 Hypertensive chronic kidney disease with stage 1 through stage 4 chronic kidney disease, or unspecified chronic kidney disease: Secondary | ICD-10-CM | POA: Diagnosis not present

## 2019-03-22 DIAGNOSIS — E781 Pure hyperglyceridemia: Secondary | ICD-10-CM | POA: Diagnosis not present

## 2019-03-25 ENCOUNTER — Ambulatory Visit
Admission: RE | Admit: 2019-03-25 | Discharge: 2019-03-25 | Disposition: A | Discharge status: Home / Self Care | Payer: BLUE CROSS/BLUE SHIELD | Source: Ambulatory Visit | Attending: Endocrinology | Admitting: Endocrinology

## 2019-03-25 DIAGNOSIS — R42 Dizziness and giddiness: Secondary | ICD-10-CM

## 2019-03-25 DIAGNOSIS — R51 Headache: Secondary | ICD-10-CM | POA: Diagnosis not present

## 2019-03-25 MED ORDER — IOPAMIDOL (ISOVUE-300) INJECTION 61%
75.0000 mL | Freq: Once | INTRAVENOUS | Status: AC | PRN
  Administered 2019-03-25: 75 mL via INTRAVENOUS

## 2019-03-29 DIAGNOSIS — I129 Hypertensive chronic kidney disease with stage 1 through stage 4 chronic kidney disease, or unspecified chronic kidney disease: Secondary | ICD-10-CM | POA: Diagnosis not present

## 2019-03-29 DIAGNOSIS — R42 Dizziness and giddiness: Secondary | ICD-10-CM | POA: Diagnosis not present

## 2019-03-29 DIAGNOSIS — E1165 Type 2 diabetes mellitus with hyperglycemia: Secondary | ICD-10-CM | POA: Diagnosis not present

## 2019-03-29 DIAGNOSIS — N182 Chronic kidney disease, stage 2 (mild): Secondary | ICD-10-CM | POA: Diagnosis not present

## 2019-04-29 DIAGNOSIS — Z0289 Encounter for other administrative examinations: Secondary | ICD-10-CM | POA: Diagnosis not present

## 2019-04-29 DIAGNOSIS — R42 Dizziness and giddiness: Secondary | ICD-10-CM | POA: Diagnosis not present

## 2019-04-29 DIAGNOSIS — E1165 Type 2 diabetes mellitus with hyperglycemia: Secondary | ICD-10-CM | POA: Diagnosis not present

## 2019-05-07 ENCOUNTER — Other Ambulatory Visit: Payer: Self-pay | Admitting: Endocrinology

## 2019-05-07 DIAGNOSIS — R42 Dizziness and giddiness: Secondary | ICD-10-CM

## 2019-05-09 ENCOUNTER — Telehealth: Payer: Self-pay | Admitting: Neurology

## 2019-05-09 ENCOUNTER — Ambulatory Visit: Payer: Managed Care, Other (non HMO) | Admitting: Neurology

## 2019-05-09 ENCOUNTER — Other Ambulatory Visit: Payer: Self-pay

## 2019-05-09 ENCOUNTER — Encounter: Payer: Self-pay | Admitting: Neurology

## 2019-05-09 DIAGNOSIS — R42 Dizziness and giddiness: Secondary | ICD-10-CM | POA: Diagnosis not present

## 2019-05-09 DIAGNOSIS — N182 Chronic kidney disease, stage 2 (mild): Secondary | ICD-10-CM | POA: Diagnosis not present

## 2019-05-09 DIAGNOSIS — I129 Hypertensive chronic kidney disease with stage 1 through stage 4 chronic kidney disease, or unspecified chronic kidney disease: Secondary | ICD-10-CM | POA: Diagnosis not present

## 2019-05-09 DIAGNOSIS — E1165 Type 2 diabetes mellitus with hyperglycemia: Secondary | ICD-10-CM | POA: Diagnosis not present

## 2019-05-09 NOTE — Telephone Encounter (Signed)
This patient did not show for a new patient appointment today. 

## 2019-05-10 DIAGNOSIS — Z23 Encounter for immunization: Secondary | ICD-10-CM | POA: Diagnosis not present

## 2019-05-13 ENCOUNTER — Other Ambulatory Visit: Payer: Self-pay

## 2019-05-13 ENCOUNTER — Encounter: Payer: Self-pay | Admitting: Neurology

## 2019-05-13 ENCOUNTER — Ambulatory Visit (INDEPENDENT_AMBULATORY_CARE_PROVIDER_SITE_OTHER): Payer: BC Managed Care – PPO | Admitting: Neurology

## 2019-05-13 VITALS — BP 118/70 | HR 108 | Temp 97.5°F | Ht 69.0 in | Wt 201.0 lb

## 2019-05-13 DIAGNOSIS — H8112 Benign paroxysmal vertigo, left ear: Secondary | ICD-10-CM | POA: Diagnosis not present

## 2019-05-13 DIAGNOSIS — R42 Dizziness and giddiness: Secondary | ICD-10-CM | POA: Diagnosis not present

## 2019-05-13 DIAGNOSIS — H8110 Benign paroxysmal vertigo, unspecified ear: Secondary | ICD-10-CM | POA: Insufficient documentation

## 2019-05-13 MED ORDER — MECLIZINE HCL 25 MG PO TABS: 25.0000 mg | ORAL_TABLET | Freq: Three times a day (TID) | ORAL | 2 refills | Status: DC | PRN

## 2019-05-13 NOTE — Patient Instructions (Signed)
Benign Positional Vertigo Vertigo is the feeling that you or your surroundings are moving when they are not. Benign positional vertigo is the most common form of vertigo. This is usually a harmless condition (benign). This condition is positional. This means that symptoms are triggered by certain movements and positions. This condition can be dangerous if it occurs while you are doing something that could cause harm to you or others. This includes activities such as driving or operating machinery. What are the causes? In many cases, the cause of this condition is not known. It may be caused by a disturbance in an area of the inner ear that helps your brain to sense movement and balance. This disturbance can be caused by:  Viral infection (labyrinthitis).  Head injury.  Repetitive motion, such as jumping, dancing, or running. What increases the risk? You are more likely to develop this condition if:  You are a woman.  You are 50 years of age or older. What are the signs or symptoms? Symptoms of this condition usually happen when you move your head or your eyes in different directions. Symptoms may start suddenly, and usually last for less than a minute. They include:  Loss of balance and falling.  Feeling like you are spinning or moving.  Feeling like your surroundings are spinning or moving.  Nausea and vomiting.  Blurred vision.  Dizziness.  Involuntary eye movement (nystagmus). Symptoms can be mild and cause only minor problems, or they can be severe and interfere with daily life. Episodes of benign positional vertigo may return (recur) over time. Symptoms may improve over time. How is this diagnosed? This condition may be diagnosed based on:  Your medical history.  Physical exam of the head, neck, and ears.  Tests, such as: ? MRI. ? CT scan. ? Eye movement tests. Your health care provider may ask you to change positions quickly while he or she watches you for symptoms  of benign positional vertigo, such as nystagmus. Eye movement may be tested with a variety of exams that are designed to evaluate or stimulate vertigo. ? An electroencephalogram (EEG). This records electrical activity in your brain. ? Hearing tests. You may be referred to a health care provider who specializes in ear, nose, and throat (ENT) problems (otolaryngologist) or a provider who specializes in disorders of the nervous system (neurologist). How is this treated?  This condition may be treated in a session in which your health care provider moves your head in specific positions to adjust your inner ear back to normal. Treatment for this condition may take several sessions. Surgery may be needed in severe cases, but this is rare. In some cases, benign positional vertigo may resolve on its own in 2-4 weeks. Follow these instructions at home: Safety  Move slowly. Avoid sudden body or head movements or certain positions, as told by your health care provider.  Avoid driving until your health care provider says it is safe for you to do so.  Avoid operating heavy machinery until your health care provider says it is safe for you to do so.  Avoid doing any tasks that would be dangerous to you or others if vertigo occurs.  If you have trouble walking or keeping your balance, try using a cane for stability. If you feel dizzy or unstable, sit down right away.  Return to your normal activities as told by your health care provider. Ask your health care provider what activities are safe for you. General instructions  Take over-the-counter   and prescription medicines only as told by your health care provider.  Drink enough fluid to keep your urine pale yellow.  Keep all follow-up visits as told by your health care provider. This is important. Contact a health care provider if:  You have a fever.  Your condition gets worse or you develop new symptoms.  Your family or friends notice any  behavioral changes.  You have nausea or vomiting that gets worse.  You have numbness or a "pins and needles" sensation. Get help right away if you:  Have difficulty speaking or moving.  Are always dizzy.  Faint.  Develop severe headaches.  Have weakness in your legs or arms.  Have changes in your hearing or vision.  Develop a stiff neck.  Develop sensitivity to light. Summary  Vertigo is the feeling that you or your surroundings are moving when they are not. Benign positional vertigo is the most common form of vertigo.  The cause of this condition is not known. It may be caused by a disturbance in an area of the inner ear that helps your brain to sense movement and balance.  Symptoms include loss of balance and falling, feeling that you or your surroundings are moving, nausea and vomiting, and blurred vision.  This condition can be diagnosed based on symptoms, physical exam, and other tests, such as MRI, CT scan, eye movement tests, and hearing tests.  Follow safety instructions as told by your health care provider. You will also be told when to contact your health care provider in case of problems. This information is not intended to replace advice given to you by your health care provider. Make sure you discuss any questions you have with your health care provider. Document Released: 05/02/2006 Document Revised: 01/03/2018 Document Reviewed: 01/03/2018 Elsevier Patient Education  [place] tablets or capsules What is this medicine? MECLIZINE (MEK li zeen) is an antihistamine. It is used to prevent nausea, vomiting, or dizziness caused by motion sickness. It is also used to prevent and treat vertigo (extreme dizziness or a feeling that you or your surroundings are tilting or spinning around). This medicine may be used for other purposes; ask your health care provider or pharmacist if you have questions. COMMON BRAND NAME(S): Antivert, Dramamine Less  Drowsy, Dramamine-N, Medivert, Meni-D What should I tell my health care provider before I take this medicine? They need to know if you have any of these conditions:  glaucoma  lung or breathing disease, like asthma  problems urinating  prostate disease  stomach or intestine problems  an unusual or allergic reaction to meclizine, other medicines, foods, dyes, or preservatives  pregnant or trying to get pregnant  breast-feeding How should I use this medicine? Take this medicine by mouth with a glass of water. Follow the directions on the prescription label. If you are using this medicine to prevent motion sickness, take the dose at least 1 hour before travel. If it upsets your stomach, take it with food or milk. Take your doses at regular intervals. Do not take your medicine more often than directed. Talk to your pediatrician regarding the use of this medicine in children. Special care may be needed. Overdosage: If you think you have taken too much of this medicine contact a poison control center or emergency room at once. NOTE: This medicine is only for you. Do not share this medicine with others. What if I miss a dose? If you miss a dose, take it as soon as you  can. If it is almost time for your next dose, take only that dose. Do not take double or extra doses. What may interact with this medicine? Do not take this medicine with any of the following medications:  MAOIs like Carbex, Eldepryl, Marplan, Nardil, and Parnate This medicine may also interact with the following medications:  alcohol  antihistamines for allergy, cough and cold  certain medicines for anxiety or sleep  certain medicines for depression, like amitriptyline, fluoxetine, sertraline  certain medicines for seizures like phenobarbital, primidone  general anesthetics like halothane, isoflurane, methoxyflurane, propofol  local anesthetics like lidocaine, pramoxine, tetracaine  medicines that relax muscles  for surgery  narcotic medicines for pain  phenothiazines like chlorpromazine, mesoridazine, prochlorperazine, thioridazine This list may not describe all possible interactions. Give your health care provider a list of all the medicines, herbs, non-prescription drugs, or dietary supplements you use. Also tell them if you smoke, drink alcohol, or use illegal drugs. Some items may interact with your medicine. What should I watch for while using this medicine? Tell your doctor or healthcare professional if your symptoms do not start to get better or if they get worse. You may get drowsy or dizzy. Do not drive, use machinery, or do anything that needs mental alertness until you know how this medicine affects you. Do not stand or sit up quickly, especially if you are an older patient. This reduces the risk of dizzy or fainting spells. Alcohol may interfere with the effect of this medicine. Avoid alcoholic drinks. Your mouth may get dry. Chewing sugarless gum or sucking hard candy, and drinking plenty of water may help. Contact your doctor if the problem does not go away or is severe. This medicine may cause dry eyes and blurred vision. If you wear contact lenses you may feel some discomfort. Lubricating drops may help. See your eye doctor if the problem does not go away or is severe. What side effects may I notice from receiving this medicine? Side effects that you should report to your doctor or health care professional as soon as possible:  feeling faint or lightheaded, falls  fast, irregular heartbeat Side effects that usually do not require medical attention (report to your doctor or health care professional if they continue or are bothersome):  constipation  headache  trouble passing urine or change in the amount of urine  trouble sleeping  upset stomach This list may not describe all possible side effects. Call your doctor for medical advice about side effects. You may report side effects  to FDA at 1-800-FDA-1088. Where should I keep my medicine? Keep out of the reach of children. Store at room temperature between 15 and 30 degrees C (59 and 86 degrees F). Keep container tightly closed. Throw away any unused medicine after the expiration date. NOTE: This sheet is a summary. It may not cover all possible information. If you have questions about this medicine, talk to your doctor, pharmacist, or health care provider.  2020 Elsevier/Gold Standard (2015-08-26 19:41:02)

## 2019-05-13 NOTE — Progress Notes (Signed)
[id number]    Provider:  Dr Jaynee Eagles Requesting Provider: [name], MD Primary Care Provider:  [name], MD  CC:  Dizziness,vertigo  HPI:  George Franco is a 63 y.o. male here as requested by [name], MD for vertigo.  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 Vertigo started several years ago, it is episodic, it hits him "all of a sudden", he feels dizzy and feels like he is going to fall. He is going to have another MRI 05/28/2019 ordered by Dr. Forde Dandy. Vertigo lasts all day, if he turn to the left the vertigo worsens, he says the episodes come and go and he started having the symptoms sine 8am. If he turns his head he can make the symptoms worse. He was shown the Epley Maneuvers by ENT. Never been to vestibular therapy. No vomiting. No weakness or vision changes or any other focal deficit. Sitting still helps the vertigo. No inciting events, he has several episodes a year lasting 1-2 days. He sometimes has headaches, frontal, dull not migrainous. Lack of sleep worsens. Melatonin helps him sleep. No other focal neurologic deficits, associated symptoms, inciting events or modifiable factors.  Reviewed notes, labs and imaging from outside physicians, which showed: 03/25/2019: Ct w/wo showed No acute intracranial abnormalities including mass lesion or mass effect, hydrocephalus, extra-axial fluid collection, midline shift, hemorrhage, or acute infarction, large ischemic events (personally reviewed images)  Reviewed MRI brain report 11/2014: FINDINGS: Major intracranial vascular flow voids are stable. Cerebral volume is normal. No restricted diffusion to suggest acute infarction. No midline shift, mass effect, evidence of mass lesion, ventriculomegaly,  extra-axial collection or acute intracranial hemorrhage. Cervicomedullary junction and pituitary are within normal limits. Negative visualized cervical spine.  Cbc normal, cmp with elevated glucose otherwise unremarkable  Pearline Cables and white matter signal throughout the brain appears within normal limits for age and not changed since 2007. No cortical encephalomalacia or chronic blood products identified.  Diminutive appearance of both IAC which is chronic. Other visible internal auditory structures appear grossly normal in the mastoids are clear.  Negative paranasal sinuses. Visualized orbit soft tissues are within normal limits. Normal bone marrow signal. Visualized scalp soft tissues are within normal limits.  IMPRESSION: 1. Stable and essentially normal for age noncontrast MRI appearance of the brain. 2. Chronic diminutive appearance of the bony internal auditory canals. Is there any evidence of vestibulocochlear neuropathy, or any other cranial neuropathies to suggest entrapment such as due to hyperostosis crani  alis interna?  I reviewed Dr. Baldwin Crown notes.  Patient presented for short-term disability and restrictions form to be filled out.  Dizziness had improved "a little bit" at last appointment.  He had been offered neurology referral in the past but declined but at the last appointment wanted to see neurology.  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  No vision changes.  I reviewed examination which showed slightly elevated blood pressure at 155/74, otherwise physical exam was normal, he did complain of vertigo, dizziness and gait instability.   Review of Systems: Patient complains of symptoms per HPI as well as the following symptoms:vertigo. Pertinent negatives  and positives per HPI.  All others negative.   Social History   Socioeconomic History  . Marital status: Married    Spouse name: Not on file  . Number of children: 3  . Years of education: Not on file  . Highest education level: Not on file  Occupational History  . Occupation: IT sales professional: [employer]  Social Needs  . Financial resource strain: Not on file  . Food insecurity    Worry: Not on file    Inability: Not on file  . Transportation needs    Medical: Not on file    Non-medical: Not on file  Tobacco Use  . Smoking status: Current Some Day Smoker    Packs/day: 0.50    Years: 20.00    Pack years: 10.00    Types: Cigarettes  . Smokeless tobacco: Never Used  Substance and Sexual Activity  . Alcohol use: Yes    Alcohol/week: 0.0 standard drinks    Comment: occasional  . Drug use: No  . Sexual activity: Yes    Partners: Female  Lifestyle  . Physical activity    Days per week: Not on file    Minutes per session: Not on file  . Stress: Not on file  Relationships  . Social Herbalist on phone: Not on file    Gets together: Not on file    Attends religious service: Not on file    Active member of club or organization: Not on file    Attends meetings of clubs or organizations: Not on file    Relationship status: Not on file  . Intimate partner violence    Fear of current or ex partner: Not on file    Emotionally abused: Not on file    Physically abused: Not on file    Forced sexual activity: Not on file  Other Topics Concern  . 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 Prostate cancer Father   . Diabetes Father   . Diabetes Mother   . Diabetes Brother   . Colon cancer Neg Hx   . Esophageal cancer Neg Hx   . Liver cancer  Neg Hx   . Pancreatic cancer Neg Hx   . Rectal cancer Neg Hx   . Stomach cancer Neg Hx     Past Medical History:  Diagnosis Date  . At risk for sleep apnea    STOP-BANG= 5     SENT TO PCP 06-02-2015  . BPH (benign prostatic hyperplasia)   . CTS (carpal tunnel syndrome)   . CTS (carpal tunnel syndrome)   . ED (erectile dysfunction) of organic origin   . Hyperlipidemia   . Hypertension   . Hypogonadism in male   . Knee pain    pt said he doesn't know anything about this  . Lower urinary tract symptoms (LUTS)   . LVH (left ventricular hypertrophy)   . Persistent vertigo of central origin   . Prostate cancer [facility]) urologist-  dr ottelin/  oncologist-  [name]   Stage T1c, Gleason 4+3, PSA 4.48,  vol 32.9cc--  External RXT complete 05-08-2015  . Type 2  diabetes mellitus ([place])   . Vestibular neuropathy   . Wears glasses     Patient Active Problem List   Diagnosis Date Noted  . BPPV (benign paroxysmal positional vertigo) 05/13/2019  . Abnormal CT scan of lung - RLL ground glass density 01/26/2018  . Malignant neoplasm of prostate ([place]) 01/26/2015  . Dizziness and giddiness 11/18/2014  . Essential hypertension, benign 11/18/2014    Past Surgical History:  Procedure Laterality Date  . COLONOSCOPY    . PROSTATE BIOPSY    . RADIOACTIVE SEED IMPLANT N/A 06/05/2015   Procedure: RADIOACTIVE SEED IMPLANT/BRACHYTHERAPY IMPLANT;  Surgeon: [name], MD;  Location: [place];  Service: Urology;  Laterality: N/A;  . ROTATOR CUFF REPAIR Right 2014  . UMBILICAL HERNIA REPAIR  1990's  . UPPER GASTROINTESTINAL ENDOSCOPY    . wisdom teeth extrat      Current Outpatient Medications  Medication Sig Dispense Refill  . benazepril (LOTENSIN) 40 MG tablet Take 40 mg by mouth daily.    Marland Kitchen FARXIGA 10 MG TABS tablet Take 10 mg by mouth daily.    . Insulin Degludec (TRESIBA) 100 UNIT/ML SOLN Inject 20 Units into the skin daily.    Marland Kitchen loperamide (IMODIUM) 2 MG capsule Take 2  mg by mouth every 6 (six) hours as needed for constipation.  0  . metoCLOPramide (REGLAN) 10 MG tablet Take 1 tablet (10 mg total) by mouth every 6 (six) hours. 30 tablet 0  . NOVOLOG FLEXPEN 100 UNIT/ML FlexPen Inject 20 Units into the skin 3 (three) times daily.  6  . ondansetron (ZOFRAN-ODT) 4 MG disintegrating tablet Take 4 mg by mouth every 4 (four) hours as needed for nausea/vomiting.  0  . pantoprazole (PROTONIX) 40 MG tablet Take 1 tablet (40 mg total) by mouth daily. 90 tablet 3  . meclizine (ANTIVERT) 25 MG tablet Take 1 tablet (25 mg total) by mouth 3 (three) times daily as needed for dizziness. 30 tablet 2   Current Facility-Administered Medications  Medication Dose Route Frequency Provider Last Rate Last Dose  . 0.9 %  sodium chloride infusion  500 mL Intravenous Once Jackquline Denmark, MD        Allergies as of 05/13/2019  . (No Known Allergies)    Vitals: BP 118/70 (BP Location: Right Arm, Patient Position: Sitting)   Pulse (!) 108   Temp (!) 97.5 F (36.4 C) Comment: taken by check-in staff  Ht 5\' 9"  (1.753 m)   Wt 201 lb (91.2 kg)   BMI 29.68 kg/m  Last Weight:  Wt Readings from Last 1 Encounters:  05/13/19 201 lb (91.2 kg)   Last Height:   Ht Readings from Last 1 Encounters:  05/13/19 5\' 9"  (1.753 m)     Physical exam: Exam: Gen: NAD, conversant, well nourised, obese, well groomed                     CV: RRR, no MRG. No Carotid Bruits. No peripheral edema, warm, nontender Eyes: Conjunctivae clear without exudates or hemorrhage  Neuro: Detailed Neurologic Exam  Speech:    Speech is normal; fluent and spontaneous with normal comprehension.  Cognition:    The patient is oriented to person, place, and time;     recent and remote memory appears slighty impaired    language fluent;     Impaired attention, concentration, fund of knowledge Cranial Nerves:    The pupils are equal, round, and reactive to light. Attempted fundoscopy could not visualize due  to  small pupils.. Visual fields are full to finger confrontation. Extraocular movements are intact. Trigeminal sensation is intact and the muscles of mastication are normal. The face is symmetric. The palate elevates in the midline. Hearing intact to voice and finger rub. Voice is normal. Shoulder shrug is normal. The tongue has normal motion without fasciculations.   Coordination:    Normal FTN, difficulty with tandem  Gait:    Wide based  Motor Observation:    No asymmetry, no atrophy, and no involuntary movements noted. Tone:    Normal muscle tone.    Posture:    Posture is normal. normal erect    Strength:    Strength is V/V in the upper and lower limbs.      Sensation: intact to LT     Reflex Exam:  DTR's:    Deep tendon reflexes in the upper and lower extremities are symmetrical bilaterally.   Toes:    The toes are downgoing bilaterally.   Clonus:    Clonus is absent.    Assessment/Plan:  63 year old with episodic dizziness/vertigo worse when turning to the left. Sounds like BPPV. MRI showed "diminutive IACs" and Dr. Forde Dandy has ordered f/u MRI.  - Start Meclizine - Send to PT for teaching on [employer], vestibular therapy - Follow MRI with Dr. Forde Dandy - fall precautions  Orders Placed This Encounter  Procedures  . Ambulatory referral to Physical Therapy   Meds ordered this encounter  Medications  . meclizine (ANTIVERT) 25 MG tablet    Sig: Take 1 tablet (25 mg total) by mouth 3 (three) times daily as needed for dizziness.    Dispense:  30 tablet    Refill:  2    Cc: [name], MD  Sarina Ill, MD  [facility] [address] [place], [place] [address]  Phone [phone] Fax [phone]

## 2019-05-22 ENCOUNTER — Ambulatory Visit: Payer: Managed Care, Other (non HMO) | Admitting: Neurology

## 2019-05-23 DIAGNOSIS — E1165 Type 2 diabetes mellitus with hyperglycemia: Secondary | ICD-10-CM | POA: Diagnosis not present

## 2019-05-23 DIAGNOSIS — I129 Hypertensive chronic kidney disease with stage 1 through stage 4 chronic kidney disease, or unspecified chronic kidney disease: Secondary | ICD-10-CM | POA: Diagnosis not present

## 2019-05-23 DIAGNOSIS — N182 Chronic kidney disease, stage 2 (mild): Secondary | ICD-10-CM | POA: Diagnosis not present

## 2019-05-23 DIAGNOSIS — R42 Dizziness and giddiness: Secondary | ICD-10-CM | POA: Diagnosis not present

## 2019-05-28 ENCOUNTER — Ambulatory Visit
Admission: RE | Admit: 2019-05-28 | Discharge: 2019-05-28 | Disposition: A | Discharge status: Home / Self Care | Payer: BC Managed Care – PPO | Source: Ambulatory Visit | Attending: Endocrinology | Admitting: Endocrinology

## 2019-05-28 ENCOUNTER — Other Ambulatory Visit: Payer: Self-pay

## 2019-05-28 DIAGNOSIS — G9389 Other specified disorders of brain: Secondary | ICD-10-CM | POA: Diagnosis not present

## 2019-05-28 DIAGNOSIS — R42 Dizziness and giddiness: Secondary | ICD-10-CM

## 2019-05-28 DIAGNOSIS — I6389 Other cerebral infarction: Secondary | ICD-10-CM | POA: Diagnosis not present

## 2019-05-28 DIAGNOSIS — R9082 White matter disease, unspecified: Secondary | ICD-10-CM | POA: Diagnosis not present

## 2019-05-28 MED ORDER — GADOBENATE DIMEGLUMINE 529 MG/ML IV SOLN
18.0000 mL | Freq: Once | INTRAVENOUS | Status: AC | PRN
  Administered 2019-05-28: 18 mL via INTRAVENOUS

## 2019-05-29 ENCOUNTER — Telehealth (HOSPITAL_COMMUNITY): Payer: Self-pay | Admitting: *Deleted

## 2019-05-29 NOTE — Telephone Encounter (Signed)
Attempted to call patient on home and mobile to schedule appt ordered by Dr. Forde Dandy

## 2019-06-03 NOTE — Progress Notes (Addendum)
[facility]    Provider:  Dr Jaynee Eagles Requesting Provider: [name], MD Primary Care Provider:  [name], MD  CC:  Dizziness,vertigo  Today patient comes in for follow up, multiple lacunar strokes in the right pons, discussed lacunar strokes, causes, he smells heavily of cigarette smoke and we discussed smoking as a big risk factor, cholesterol, diabetes. We reviewed all the images togather, showed him the microvascular chronic changes. He needs close follow up and strict management of vascular risk factors. Will complete stroke workup with CTA head and echocardiogram.   HPI:  George Franco is a 63 y.o. male here as requested by [name], MD for vertigo.  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 Vertigo started several years ago, it is episodic, it hits him "all of a sudden", he feels dizzy and feels like he is going to fall. He is going to have another MRI 05/28/2019 ordered by Dr. Forde Dandy. Vertigo lasts all day, if he turn to the left the vertigo worsens, he says the episodes come and go and he started having the symptoms sine 8am. If he turns his head he can make the symptoms worse. He was shown the Epley Maneuvers by ENT. Never been to vestibular therapy. No vomiting. No weakness or vision changes or any other focal deficit. Sitting still helps the vertigo. No inciting events, he has several episodes a year lasting 1-2 days. He sometimes has headaches, frontal, dull not migrainous. Lack of sleep worsens. Melatonin helps him sleep. No other focal neurologic deficits, associated symptoms, inciting events or modifiable factors.  Reviewed notes, labs and imaging from outside physicians, which showed: 03/25/2019: Ct w/wo showed No acute intracranial  abnormalities including mass lesion or mass effect, hydrocephalus, extra-axial fluid collection, midline shift, hemorrhage, or acute infarction, large ischemic events (personally reviewed images)  Reviewed MRI brain report 11/2014: FINDINGS: Major intracranial vascular flow voids are stable. Cerebral volume is normal. No restricted diffusion to suggest acute infarction. No midline shift, mass effect, evidence of mass lesion, ventriculomegaly, extra-axial collection or acute intracranial hemorrhage. Cervicomedullary junction and pituitary are within normal limits. Negative visualized cervical spine.  Cbc normal, cmp with elevated glucose otherwise unremarkable  Pearline Cables and white matter signal throughout the brain appears within normal limits for age and not changed since 2007. No cortical encephalomalacia or chronic blood products identified.  Diminutive appearance of both IAC which is chronic. Other visible internal auditory structures appear grossly normal in the mastoids are clear.  Negative paranasal sinuses. Visualized orbit soft tissues are within normal limits. Normal bone marrow signal. Visualized scalp soft tissues are within normal limits.  IMPRESSION: 1. Stable and essentially normal for age noncontrast MRI appearance of the brain. 2. Chronic diminutive appearance of the bony internal auditory canals. Is there any evidence of vestibulocochlear neuropathy, or any other cranial neuropathies to suggest entrapment such as due to hyperostosis crani  alis interna?  I reviewed Dr. Baldwin Crown notes.  Patient presented for short-term disability and restrictions form to be filled out.  Dizziness had improved "a little bit" at last appointment.  He had been offered neurology referral in the past but declined but at the last appointment wanted to see neurology.  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  No vision changes.  I reviewed examination which showed slightly elevated blood pressure at 155/74, otherwise physical exam was normal, he did complain of vertigo, dizziness and gait instability.   Review of Systems: Patient complains of symptoms per HPI as well as the following symptoms:vertigo,strokes,dizziness. Pertinent negatives and positives per HPI. All others negative.   Social History   Socioeconomic History  . Marital status: Married    Spouse name: Not on file  . Number of children: 3  . Years of education: Not on file  . Highest education level: Not on file  Occupational History  . Occupation: IT sales professional: [employer]  Social Needs  . Financial resource strain: Not on file  . Food insecurity    Worry: Not on file    Inability: Not on file  . Transportation needs    Medical: Not on file    Non-medical: Not on file  Tobacco Use  . Smoking status: Current Some Day Smoker    Packs/day: 0.50    Years: 20.00    Pack years: 10.00    Types: Cigarettes  . Smokeless tobacco: Never Used  Substance and Sexual Activity  . Alcohol use: Yes    Alcohol/week: 0.0 standard drinks    Comment: occasional  . Drug use: No  . Sexual activity: Yes    Partners: Female  Lifestyle  . Physical activity    Days per week: Not on file    Minutes per session: Not on file  . Stress: Not on file  Relationships  . Social Herbalist on phone: Not on file    Gets together: Not on file    Attends religious service: Not on file    Active member of club or organization: Not on file    Attends meetings of clubs or organizations: Not on file    Relationship status: Not on file  . Intimate partner violence    Fear of current or ex partner: Not on file    Emotionally abused: Not on file    Physically abused: Not on file    Forced sexual activity: Not on file   Other Topics Concern  . 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 Prostate cancer Father   . Diabetes Father   . Diabetes Mother   . Diabetes Brother   . Colon cancer Neg Hx   . Esophageal cancer Neg Hx   . Liver cancer Neg Hx   . Pancreatic cancer Neg Hx   . Rectal cancer Neg Hx   . Stomach cancer Neg Hx     Past Medical History:  Diagnosis Date  . At risk for sleep apnea    STOP-BANG= 5     SENT TO PCP 06-02-2015  . BPH (benign prostatic hyperplasia)   . CTS (carpal tunnel syndrome)   . CTS (carpal tunnel syndrome)   . ED (erectile dysfunction) of organic origin   . Hyperlipidemia   . Hypertension   . Hypogonadism in male   .  Knee pain    pt said he doesn't know anything about this  . Lower urinary tract symptoms (LUTS)   . LVH (left ventricular hypertrophy)   . Persistent vertigo of central origin   . Prostate cancer [facility]) urologist-  dr ottelin/  oncologist-  [name]   Stage T1c, Gleason 4+3, PSA 4.48,  vol 32.9cc--  External RXT complete 05-08-2015  . Stroke ([employer])   . Type 2 diabetes mellitus ([place])   . Vestibular neuropathy   . Wears glasses     Patient Active Problem List   Diagnosis Date Noted  . BPPV (benign paroxysmal positional vertigo) 05/13/2019  . Abnormal CT scan of lung - RLL ground glass density 01/26/2018  . Malignant neoplasm of prostate ([place]) 01/26/2015  . Dizziness and giddiness 11/18/2014  . Essential hypertension, benign 11/18/2014    Past Surgical History:  Procedure Laterality Date  . COLONOSCOPY    . PROSTATE BIOPSY    . RADIOACTIVE SEED IMPLANT N/A 06/05/2015   Procedure: RADIOACTIVE SEED IMPLANT/BRACHYTHERAPY IMPLANT;  Surgeon: [name], MD;  Location: [place];  Service:  Urology;  Laterality: N/A;  . ROTATOR CUFF REPAIR Right 2014  . UMBILICAL HERNIA REPAIR  1990's  . UPPER GASTROINTESTINAL ENDOSCOPY    . wisdom teeth extrat      Current Outpatient Medications  Medication Sig Dispense Refill  . benazepril (LOTENSIN) 40 MG tablet Take 40 mg by mouth daily.    Marland Kitchen FARXIGA 10 MG TABS tablet Take 10 mg by mouth daily.    . meclizine (ANTIVERT) 25 MG tablet Take 1 tablet (25 mg total) by mouth 3 (three) times daily as needed for dizziness. 30 tablet 2  . metoCLOPramide (REGLAN) 10 MG tablet Take 1 tablet (10 mg total) by mouth every 6 (six) hours. 30 tablet 0  . NOVOLOG FLEXPEN 100 UNIT/ML FlexPen Inject 20 Units into the skin 3 (three) times daily.  6  . aspirin EC 81 MG tablet Take 1 tablet (81 mg total) by mouth daily. 30 tablet 0  . Insulin Degludec (TRESIBA) 100 UNIT/ML SOLN Inject 20 Units into the skin daily.    Marland Kitchen loperamide (IMODIUM) 2 MG capsule Take 2 mg by mouth every 6 (six) hours as needed for constipation.  0  . ondansetron (ZOFRAN-ODT) 4 MG disintegrating tablet Take 4 mg by mouth every 4 (four) hours as needed for nausea/vomiting.  0  . pantoprazole (PROTONIX) 40 MG tablet Take 1 tablet (40 mg total) by mouth daily. (Patient not taking: Reported on 06/05/2019) 90 tablet 3   Current Facility-Administered Medications  Medication Dose Route Frequency Provider Last Rate Last Dose  . 0.9 %  sodium chloride infusion  500 mL Intravenous Once Jackquline Denmark, MD        Allergies as of 06/05/2019  . (No Known Allergies)    Vitals: BP (!) 168/94 (BP Location: Right Arm, Patient Position: Sitting)   Pulse (!) 104   Temp (!) 97.5 F (36.4 C) Comment: taken at front door  Ht 5\' 9"  (1.753 m)   Wt 204 lb (92.5 kg)   BMI 30.13 kg/m  Last Weight:  Wt Readings from Last 1 Encounters:  06/05/19 204 lb (92.5 kg)   Last Height:   Ht Readings from Last 1 Encounters:  06/05/19 5\' 9"  (1.753 m)     Physical exam: Exam: Gen: NAD, conversant, well  nourised, obese, well groomed  CV: RRR, no MRG. No Carotid Bruits. No peripheral edema, warm, nontender Eyes: Conjunctivae clear without exudates or hemorrhage  Neuro: Detailed Neurologic Exam  Speech:    Speech is normal; fluent and spontaneous with normal comprehension.  Cognition:    The patient is oriented to person, place, and time;     recent and remote memory appears slighty impaired    language fluent;     Impaired attention, concentration, fund of knowledge Cranial Nerves:    The pupils are equal, round, and reactive to light. Attempted fundoscopy could not visualize due to small pupils.. Visual fields are full to finger confrontation. Extraocular movements are intact. Trigeminal sensation is intact and the muscles of mastication are normal. The face is symmetric. The palate elevates in the midline. Hearing intact to voice and finger rub. Voice is normal. Shoulder shrug is normal. The tongue has normal motion without fasciculations.   Coordination:    Normal FTN, difficulty with tandem  Gait:    Wide based  Motor Observation:    No asymmetry, no atrophy, and no involuntary movements noted. Tone:    Normal muscle tone.    Posture:    Posture is normal. normal erect    Strength:    Strength is V/V in the upper and lower limbs.      Sensation: intact to LT     Reflex Exam:  DTR's:    Deep tendon reflexes in the upper and lower extremities are symmetrical bilaterally.   Toes:    The toes are downgoing bilaterally.   Clonus:    Clonus is absent.    Assessment/Plan:  63 year old with episodic dizziness/vertigo and lacunar infarcts: Symptoms may be due to his lacunar strokes in the pons however cannot tell how old they are but they are new since 2016. We discussed lacunar strokes and the need to manage his vascular risk factors and quit smoking.  (Addendum June 27, 2019: I reviewed CTA of the head and neck with vascular specialist Dr. Leonie Man.   He has atherosclerosis of multiple vessels.  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  At this time no need to address.  Patient has been advised that he has atherosclerosis and he needs to stop smoking and closely monitor all his other vascular risk factors.  His strokes were not due to large vessel occlusion such as the vertebral artery stenosis and atherosclerosis, his strokes were due to small vessel disease lacunar infarct.  However if he continues to not manage his vascular risk factors this can lead to stroke secondary to large vessel occlusion.)  I had a long d/w patient about his recent stroke, risk for recurrent stroke/TIAs, personally independently reviewed imaging studies and stroke evaluation results and answered questions.Continue ASA for secondary stroke prevention and maintain strict control of hypertension with blood pressure goal below 130/90, diabetes with hemoglobin A1c goal below 6.5% and lipids with LDL cholesterol goal below 70 mg/dL.. I also advised the patient to eat a healthy diet with plenty of whole grains, cereals, fruits and vegetables, exercise regularly and maintain ideal body weight Stop smoking.  - Will ask our office staff to check  on his PT referral he says they did not call, he needs vestibular therapy for dizziness/vertigo due to strokes vs BPPV. - Follow with Dr. Forde Dandy for management of vascular risk factors and smoking cessation  - echo - ct of the blood vessels - carotids thursday  Orders Placed  This Encounter  Procedures  . CT ANGIO HEAD W OR WO CONTRAST  . CT ANGIO NECK W OR WO CONTRAST  . ECHOCARDIOGRAM COMPLETE BUBBLE STUDY   Meds ordered this encounter  Medications  . aspirin EC 81 MG tablet    Sig: Take 1 tablet (81 mg total) by mouth daily.    Dispense:  30 tablet    Refill:  0    Cc: [name], MD  Sarina Ill, MD  [facility] [address] [place], [place] [address]  Phone [phone] Fax [phone]  A total of 40 minutes was spent face-to-face with this patient. Over half this time was spent on counseling patient on the  1. Lacunar stroke ([place])   2. Cerebrovascular accident (CVA) due to occlusion of small artery ([facility])   3. Vertigo    diagnosis and different diagnostic and therapeutic options, counseling and coordination of care, risks ans benefits of management, compliance, or risk factor reduction and education.

## 2019-06-05 ENCOUNTER — Ambulatory Visit (INDEPENDENT_AMBULATORY_CARE_PROVIDER_SITE_OTHER): Payer: BC Managed Care – PPO | Admitting: Neurology

## 2019-06-05 ENCOUNTER — Other Ambulatory Visit (HOSPITAL_COMMUNITY): Payer: Self-pay | Admitting: Endocrinology

## 2019-06-05 ENCOUNTER — Other Ambulatory Visit: Payer: Self-pay

## 2019-06-05 ENCOUNTER — Telehealth: Payer: Self-pay | Admitting: Neurology

## 2019-06-05 ENCOUNTER — Encounter: Payer: Self-pay | Admitting: Neurology

## 2019-06-05 VITALS — BP 168/94 | HR 104 | Temp 97.5°F | Ht 69.0 in | Wt 204.0 lb

## 2019-06-05 DIAGNOSIS — R42 Dizziness and giddiness: Secondary | ICD-10-CM | POA: Diagnosis not present

## 2019-06-05 DIAGNOSIS — I6381 Other cerebral infarction due to occlusion or stenosis of small artery: Secondary | ICD-10-CM | POA: Diagnosis not present

## 2019-06-05 DIAGNOSIS — I639 Cerebral infarction, unspecified: Secondary | ICD-10-CM

## 2019-06-05 MED ORDER — ASPIRIN EC 81 MG PO TBEC: 81.0000 mg | DELAYED_RELEASE_TABLET | Freq: Every day | ORAL | 0 refills | Status: AC

## 2019-06-05 NOTE — Telephone Encounter (Signed)
George Franco, can you chek on his PT referral he says they did not call, he needs vestibular therapy for dizziness/vertigo due to strokes.

## 2019-06-05 NOTE — Patient Instructions (Addendum)
Physical Therapy - help with dizziness Continue aspirin Echocardiogram heart CT of the blood vessels of the heart   Lacunar Stroke  A stroke is the sudden death of brain tissue that occurs when an area of the brain does not get enough oxygen. A lacunar stroke (lacunar infarction) is caused by a blockage in one of the small arteries deep in the brain. Lacunar stroke is a medical emergency that must be treated right away.It is important to get help right away as soon as you notice symptoms of stroke. What are the causes? A lacunar stroke occurs when small arteries deep in the brain become more narrow due to a buildup of fatty deposits in the arteries (atherosclerosis). When the arteries narrow, less blood flows to certain areas of the brain. Without enough blood and oxygen, these parts of the brain can die or become permanently damaged. What increases the risk? You are more likely to develop this condition if:  You have high blood pressure (hypertension).  You have high cholesterol (hyperlipidemia).  You smoke.  You have diabetes.  You are obese.  You have an unhealthy diet. This includes foods that are high in saturated fat, trans fat, and salt (sodium).  You have a heart rhythm disorder (atrial fibrillation).  You have heart disease.  You have artery disease, such as carotid artery disease or peripheral artery disease.  You have sickle cell disease.  You are age 31 or older.  You have a personal or family history of stroke.  You are African-American.  You are a woman who: ? Is pregnant. ? Has a history of diabetes during pregnancy (gestational diabetes). ? Has a history of preeclampsia or eclampsia. ? Has had hormone therapy after menopause. ? Uses oral birth control (contraception), especially while smoking. What are the signs or symptoms? Symptoms of this condition usually develop suddenly. They may include:  Weakness or numbness in your face, arm, or leg,  especially on one side of your body.  Trouble walking or difficulty moving your arms or legs.  Loss of balance or coordination.  Confusion.  Slurred speech (dysarthria).  Trouble speaking, understanding speech, or both (aphasia).  Vision problems in one or both eyes.  Dizziness.  Nausea and vomiting.  Severe headache with no known cause. How is this diagnosed? This condition may be diagnosed based on:  Your symptoms and medical history.  A physical exam.  Blood tests.  A CT scan of the brain.  MRI.  Ultrasound of an artery. This may help find blood flow problems or blockages.  Angiogram. During this test, dye is injected into your blood and then an X-ray is done to look for blockages. The dye helps blood flow and blockages show up clearly on X-rays.  Electroencephalogram (EEG). This test checks electrical activity in the brain. How is this treated? This condition must be treated within 4.5 hours of the start of the stroke. It is treated with IV medicine that dissolves the blood clot (tissue plasminogen activator, TPA) that is causing the blockage. The goal is to restore blood flow to the brain as soon as possible. Your health care provider may prescribe blood thinners (antiplatelets or anticoagulants) to lower your risk of another stroke. Follow these instructions at home: Medicines  Take over-the-counter and other prescription medicines only as told by your health care provider. This includes diabetes or cholesterol medicine.  If you were told to take a medicine to thin your blood, such as aspirin, take it exactly as told by  your health care provider. ? Taking too much blood-thinning medicine can cause bleeding. ? If you do not take enough blood-thinning medicine, you will not have the protection that you need against another stroke and other problems. Eating and drinking  Follow instructions from your health care provider about eating and drinking restrictions.   Eat a healthy diet. This includes plenty of fruits and vegetables, lean meats, whole grains, and low-fat dairy products.  Avoid foods high in saturated fat, trans fat, or sodium.  Limit alcohol intake to no more than 1 drink a day for nonpregnant women and 2 drinks a day for men. One drink equals 12 oz of beer, 5 oz of wine, or 1 oz of hard liquor. Safety  If you need help walking, use a cane or walker as told by your health care provider.  Take steps to lower the risk of falls in your home. This may include: ? Using safety equipment, such as raised toilets and a seat in the shower. ? Removing clutter and tripping hazards from walkways, such as cords or rugs. ? Installing grab bars in the bedroom and bathroom. Activity  Exercise regularly, as told by your health care provider.  Take part in rehabilitation programs as told by your health care provider. This may include physical therapy, occupational therapy, or speech therapy. General instructions  Do not use any tobacco products, such as cigarettes, chewing tobacco, and e-cigarettes. If you need help quitting, ask your health care provider.  Keep all follow-up visits as told by your health care provider. This is important. Get help right away if:   You have any symptoms of stroke. "BE FAST" is an easy way to remember the main warning signs of stroke: ? B - Balance. Signs are dizziness, sudden trouble walking, or loss of balance. ? E - Eyes. Signs are trouble seeing or a sudden change in vision. ? F - Face. Signs are sudden weakness or numbness of the face, or the face or eyelid drooping on one side. ? A - Arms. Signs are weakness or numbness in an arm. This happens suddenly and usually on one side of the body. ? S - Speech. Signs are sudden trouble speaking, slurred speech, or trouble understanding what people say. ? T - Time. Time to call emergency services. Write down what time symptoms started.  You have other signs of stroke,  such as: ? A sudden, severe headache with no known cause. ? Nausea or vomiting. ? Seizure.  You have a severe fall or injury. These symptoms may represent a serious problem that is an emergency. Do not wait to see if the symptoms will go away. Get medical help right away. Call your local emergency services (911 in the [place] Do not drive yourself to the hospital. Summary  A lacunar stroke (lacunar infarction) is a blockage of one of the small arteries deep in the brain. When one of these arteries is blocked, parts of the brain do not get enough oxygen and may die.  This condition is a medical emergency that must be treated right away. Treatments must be done within 4.5 hours of the start of the stroke.  Controlling your risk factors for stroke is the best way to avoid another lacunar stroke.  Get help right away if you have any symptoms of stroke. "BE FAST" is an easy way to remember the main warning signs of stroke. This information is not intended to replace advice given to you by your health care provider.  Make sure you discuss any questions you have with your health care provider. Document Released: 12/09/2016 Document Revised: 04/13/2018 Document Reviewed: 12/09/2016 Elsevier Patient Education  [employer].    Stroke Prevention Some medical conditions and lifestyle choices can lead to a higher risk for a stroke. You can help to prevent a stroke by making nutrition, lifestyle, and other changes. What nutrition changes can be made?   Eat healthy foods. ? Choose foods that are high in fiber. These include:  Fresh fruits.  Fresh vegetables.  Whole grains. ? Eat at least 5 or more servings of fruits and vegetables each day. Try to fill half of your plate at each meal with fruits and vegetables. ? Choose lean protein foods. These include:  Lowfat (lean) cuts of meat.  Chicken without skin.  Fish.  Tofu.  Beans.  Nuts. ? Eat low-fat dairy products. ? Avoid foods  that:  Are high in salt (sodium).  Have saturated fat.  Have trans fat.  Have cholesterol.  Are processed.  Are premade.  Follow eating guidelines as told by your doctor. These may include: ? Reducing how many calories you eat and drink each day. ? Limiting how much salt you eat or drink each day to 1,500 milligrams (mg). ? Using only healthy fats for cooking. These include:  Olive oil.  Canola oil.  Sunflower oil. ? Counting how many carbohydrates you eat and drink each day. What lifestyle changes can be made?  Try to stay at a healthy weight. Talk to your doctor about what a good weight is for you.  Get at least 30 minutes of moderate physical activity at least 5 days a week. This can include: ? Fast walking. ? Biking. ? Swimming.  Do not use any products that have nicotine or tobacco. This includes cigarettes and e-cigarettes. If you need help quitting, ask your doctor. Avoid being around tobacco smoke in general.  Limit how much alcohol you drink to no more than 1 drink a day for nonpregnant women and 2 drinks a day for men. One drink equals 12 oz of beer, 5 oz of wine, or 1 oz of hard liquor.  Do not use drugs.  Avoid taking birth control pills. Talk to your doctor about the risks of taking birth control pills if: ? You are over 13 years old. ? You smoke. ? You get migraines. ? You have had a blood clot. What other changes can be made?  Manage your cholesterol. ? It is important to eat a healthy diet. ? If your cholesterol cannot be managed through your diet, you may also need to take medicines. Take medicines as told by your doctor.  Manage your diabetes. ? It is important to eat a healthy diet and to exercise regularly. ? If your blood sugar cannot be managed through diet and exercise, you may need to take medicines. Take medicines as told by your doctor.  Control your high blood pressure (hypertension). ? Try to keep your blood pressure below 130/80.  This can help lower your risk of stroke. ? It is important to eat a healthy diet and to exercise regularly. ? If your blood pressure cannot be managed through diet and exercise, you may need to take medicines. Take medicines as told by your doctor. ? Ask your doctor if you should check your blood pressure at home. ? Have your blood pressure checked every year. Do this even if your blood pressure is normal.  Talk to your doctor about  getting checked for a sleep disorder. Signs of this can include: ? Snoring a lot. ? Feeling very tired.  Take over-the-counter and prescription medicines only as told by your doctor. These may include aspirin or blood thinners (antiplatelets or anticoagulants).  Make sure that any other medical conditions you have are managed. Where to find more information  American Stroke Association: www.strokeassociation.org  National Stroke Association: www.stroke.org Get help right away if:  You have any symptoms of stroke. "BE FAST" is an easy way to remember the main warning signs: ? B - Balance. Signs are dizziness, sudden trouble walking, or loss of balance. ? E - Eyes. Signs are trouble seeing or a sudden change in how you see. ? F - Face. Signs are sudden weakness or loss of feeling of the face, or the face or eyelid drooping on one side. ? A - Arms. Signs are weakness or loss of feeling in an arm. This happens suddenly and usually on one side of the body. ? S - Speech. Signs are sudden trouble speaking, slurred speech, or trouble understanding what people say. ? T - Time. Time to call emergency services. Write down what time symptoms started.  You have other signs of stroke, such as: ? A sudden, very bad headache with no known cause. ? Feeling sick to your stomach (nausea). ? Throwing up (vomiting). ? Jerky movements you cannot control (seizure). These symptoms may represent a serious problem that is an emergency. Do not wait to see if the symptoms will go  away. Get medical help right away. Call your local emergency services (911 in the [place] Do not drive yourself to the hospital. Summary  You can prevent a stroke by eating healthy, exercising, not smoking, drinking less alcohol, and treating other health problems, such as diabetes, high blood pressure, or high cholesterol.  Do not use any products that contain nicotine or tobacco, such as cigarettes and e-cigarettes.  Get help right away if you have any signs or symptoms of a stroke. This information is not intended to replace advice given to you by your health care provider. Make sure you discuss any questions you have with your health care provider. Document Released: 01/24/2012 Document Revised: 09/20/2018 Document Reviewed: 10/26/2016 Elsevier Patient Education  2020 [employer].

## 2019-06-06 ENCOUNTER — Telehealth: Payer: Self-pay | Admitting: Neurology

## 2019-06-06 ENCOUNTER — Inpatient Hospital Stay (HOSPITAL_COMMUNITY): Admission: RE | Admit: 2019-06-06 | Payer: BC Managed Care – PPO | Source: Ambulatory Visit

## 2019-06-06 NOTE — Telephone Encounter (Signed)
[employer] Auth: [id number] (exp. 06/06/19 to 07/05/19) order sent to GI. They will reach out to the patient to schedule.

## 2019-06-06 NOTE — Telephone Encounter (Signed)
With the CT Angio Neck what are you wanting to rule out?

## 2019-06-06 NOTE — Telephone Encounter (Signed)
Sorry cancel the CTA neck it should just be the head

## 2019-06-10 NOTE — Telephone Encounter (Signed)
[facility] rehab attempted to call patient.  Type Date User Summary Attachment  General 05/29/2019 4:47 PM Lunsford, Alcide Evener - -  Note   JG attempted to contact patient - mailbox full          I called patient the telephone rang and rang I will try to call him back to tell him to call [phone] . When he is ready to schedule. Patient's other numbers on his chart need to be updated.

## 2019-06-11 ENCOUNTER — Other Ambulatory Visit (HOSPITAL_COMMUNITY): Payer: BC Managed Care – PPO

## 2019-06-12 ENCOUNTER — Telehealth (HOSPITAL_COMMUNITY): Payer: Self-pay

## 2019-06-12 ENCOUNTER — Inpatient Hospital Stay (HOSPITAL_COMMUNITY): Admission: RE | Admit: 2019-06-12 | Payer: BC Managed Care – PPO | Source: Ambulatory Visit

## 2019-06-12 NOTE — Telephone Encounter (Signed)

## 2019-06-13 ENCOUNTER — Other Ambulatory Visit: Payer: Self-pay

## 2019-06-13 ENCOUNTER — Ambulatory Visit (HOSPITAL_COMMUNITY)
Admission: RE | Admit: 2019-06-13 | Discharge: 2019-06-13 | Disposition: A | Discharge status: Home / Self Care | Payer: BC Managed Care – PPO | Source: Ambulatory Visit | Attending: Endocrinology | Admitting: Endocrinology

## 2019-06-13 DIAGNOSIS — I639 Cerebral infarction, unspecified: Secondary | ICD-10-CM | POA: Diagnosis not present

## 2019-06-14 ENCOUNTER — Encounter (HOSPITAL_COMMUNITY): Payer: Self-pay | Admitting: Neurology

## 2019-06-17 ENCOUNTER — Telehealth: Payer: Self-pay | Admitting: Neurology

## 2019-06-17 NOTE — Telephone Encounter (Signed)
Pt is asking for a call to discuss why another CT scan is needed, please call

## 2019-06-17 NOTE — Telephone Encounter (Signed)
Spoke with pt and advised the CT-angios were ordered to look at the vessels of the head & neck in more detail to check for abnormalities. He understands he should receive a call to schedule and I asked for a call back if he doesn't hear this week. He verbalized appreciation and understanding.

## 2019-06-25 ENCOUNTER — Ambulatory Visit
Admission: RE | Admit: 2019-06-25 | Discharge: 2019-06-25 | Disposition: A | Discharge status: Home / Self Care | Payer: BC Managed Care – PPO | Source: Ambulatory Visit | Attending: Neurology | Admitting: Neurology

## 2019-06-25 ENCOUNTER — Other Ambulatory Visit: Payer: Self-pay

## 2019-06-25 DIAGNOSIS — R42 Dizziness and giddiness: Secondary | ICD-10-CM

## 2019-06-25 DIAGNOSIS — I6381 Other cerebral infarction due to occlusion or stenosis of small artery: Secondary | ICD-10-CM

## 2019-06-25 MED ORDER — IOPAMIDOL (ISOVUE-370) INJECTION 76%
75.0000 mL | Freq: Once | INTRAVENOUS | Status: AC | PRN
  Administered 2019-06-25: 75 mL via INTRAVENOUS

## 2019-06-26 ENCOUNTER — Telehealth (HOSPITAL_COMMUNITY): Payer: Self-pay

## 2019-06-26 NOTE — Telephone Encounter (Signed)
New message    Just an FYI. We have made several attempts to contact this patient including sending a letter to schedule or reschedule their echocardiogram. We will be removing the patient from the echo WQ.  11.16.20 @ 11:35am mailbox is full - George Franco  11.6.20 mail reminder letter George Franco  10.29.20 @ 2:24pm spoke wtih patient have to check on ride - only come in the after noon - George Franco

## 2019-07-01 ENCOUNTER — Ambulatory Visit: Payer: No Typology Code available for payment source | Admitting: Neurology

## 2019-07-02 ENCOUNTER — Telehealth: Payer: Self-pay | Admitting: *Deleted

## 2019-07-02 NOTE — Telephone Encounter (Signed)
I attempted to reach the patient to discuss results. Received no answer and his VM was full.

## 2019-07-02 NOTE — Telephone Encounter (Signed)
-----   Message from Melvenia Beam, MD sent at 06/28/2019  9:47 AM EST ----- I reviewed with Dr. Leonie Man. He has multiple arteries with atherosclerosis (clogging of the arteries). He needs to stop smoking and take better care of his vascular risk factors or this can lead to more strokes. thanks

## 2019-07-03 NOTE — Telephone Encounter (Signed)
I called the patient again, no answer, vm full. I tried the number for the wife on DPR and it rang multiple times well over 1 minute with no answer or vm option. I called the mobile # for wife, phone rang once, left vm asking for call back from the patient or if he is not available, her to call us. Left office number and hours in the message.

## 2019-07-08 NOTE — Telephone Encounter (Signed)
Pt returning call please call back °

## 2019-07-08 NOTE — Telephone Encounter (Signed)
I called the patient and discussed the CT angio neck results and advised pt per Dr. Jaynee Eagles to stop smoking and improve his management of vascular risk factors (I.e. blood pressure, cholesterol, diet, blood sugar, etc). I advised the pt that PCP would be good to talk to discuss the further measures he can take to control these risk factors. This is important to reduce his chances of having more strokes. The pt verbalized understanding and his questions were answered. He verbalized appreciation for the call.

## 2019-07-12 DIAGNOSIS — I779 Disorder of arteries and arterioles, unspecified: Secondary | ICD-10-CM | POA: Diagnosis not present

## 2019-07-12 DIAGNOSIS — I700 Atherosclerosis of aorta: Secondary | ICD-10-CM | POA: Insufficient documentation

## 2019-07-12 DIAGNOSIS — I639 Cerebral infarction, unspecified: Secondary | ICD-10-CM | POA: Diagnosis not present

## 2019-07-12 DIAGNOSIS — E1159 Type 2 diabetes mellitus with other circulatory complications: Secondary | ICD-10-CM | POA: Diagnosis not present

## 2019-07-12 DIAGNOSIS — Z1331 Encounter for screening for depression: Secondary | ICD-10-CM | POA: Diagnosis not present

## 2019-07-18 DIAGNOSIS — E1165 Type 2 diabetes mellitus with hyperglycemia: Secondary | ICD-10-CM | POA: Diagnosis not present

## 2019-07-18 DIAGNOSIS — I129 Hypertensive chronic kidney disease with stage 1 through stage 4 chronic kidney disease, or unspecified chronic kidney disease: Secondary | ICD-10-CM | POA: Diagnosis not present

## 2019-07-18 DIAGNOSIS — Z125 Encounter for screening for malignant neoplasm of prostate: Secondary | ICD-10-CM | POA: Diagnosis not present

## 2019-07-18 DIAGNOSIS — E7849 Other hyperlipidemia: Secondary | ICD-10-CM | POA: Diagnosis not present

## 2019-07-19 DIAGNOSIS — I779 Disorder of arteries and arterioles, unspecified: Secondary | ICD-10-CM | POA: Diagnosis not present

## 2019-07-19 DIAGNOSIS — E1159 Type 2 diabetes mellitus with other circulatory complications: Secondary | ICD-10-CM | POA: Diagnosis not present

## 2019-07-19 DIAGNOSIS — I639 Cerebral infarction, unspecified: Secondary | ICD-10-CM | POA: Diagnosis not present

## 2019-07-19 DIAGNOSIS — I129 Hypertensive chronic kidney disease with stage 1 through stage 4 chronic kidney disease, or unspecified chronic kidney disease: Secondary | ICD-10-CM | POA: Diagnosis not present

## 2019-08-26 ENCOUNTER — Other Ambulatory Visit: Payer: Self-pay

## 2019-08-26 ENCOUNTER — Encounter: Payer: Self-pay | Admitting: Podiatry

## 2019-08-26 ENCOUNTER — Ambulatory Visit (INDEPENDENT_AMBULATORY_CARE_PROVIDER_SITE_OTHER): Payer: BC Managed Care – PPO | Admitting: Podiatry

## 2019-08-26 DIAGNOSIS — S91101A Unspecified open wound of right great toe without damage to nail, initial encounter: Secondary | ICD-10-CM

## 2019-08-26 DIAGNOSIS — E1142 Type 2 diabetes mellitus with diabetic polyneuropathy: Secondary | ICD-10-CM

## 2019-08-26 DIAGNOSIS — B351 Tinea unguium: Secondary | ICD-10-CM | POA: Diagnosis not present

## 2019-08-26 DIAGNOSIS — M79675 Pain in left toe(s): Secondary | ICD-10-CM

## 2019-08-26 DIAGNOSIS — M79674 Pain in right toe(s): Secondary | ICD-10-CM | POA: Diagnosis not present

## 2019-08-26 NOTE — Patient Instructions (Signed)
Diabetes Mellitus and Foot Care Foot care is an important part of your health, especially when you have diabetes. Diabetes may cause you to have problems because of poor blood flow (circulation) to your feet and legs, which can cause your skin to:  Become thinner and drier.  Break more easily.  Heal more slowly.  Peel and crack. You may also have nerve damage (neuropathy) in your legs and feet, causing decreased feeling in them. This means that you may not notice minor injuries to your feet that could lead to more serious problems. Noticing and addressing any potential problems early is the best way to prevent future foot problems. How to care for your feet Foot hygiene  Wash your feet daily with warm water and mild soap. Do not use hot water. Then, pat your feet and the areas between your toes until they are completely dry. Do not soak your feet as this can dry your skin.  Trim your toenails straight across. Do not dig under them or around the cuticle. File the edges of your nails with an emery board or nail file.  Apply a moisturizing lotion or petroleum jelly to the skin on your feet and to dry, brittle toenails. Use lotion that does not contain alcohol and is unscented. Do not apply lotion between your toes. Shoes and socks  Wear clean socks or stockings every day. Make sure they are not too tight. Do not wear knee-high stockings since they may decrease blood flow to your legs.  Wear shoes that fit properly and have enough cushioning. Always look in your shoes before you put them on to be sure there are no objects inside.  To break in new shoes, wear them for just a few hours a day. This prevents injuries on your feet. Wounds, scrapes, corns, and calluses  Check your feet daily for blisters, cuts, bruises, sores, and redness. If you cannot see the bottom of your feet, use a mirror or ask someone for help.  Do not cut corns or calluses or try to remove them with medicine.  If you  find a minor scrape, cut, or break in the skin on your feet, keep it and the skin around it clean and dry. You may clean these areas with mild soap and water. Do not clean the area with peroxide, alcohol, or iodine.  If you have a wound, scrape, corn, or callus on your foot, look at it several times a day to make sure it is healing and not infected. Check for: ? Redness, swelling, or pain. ? Fluid or blood. ? Warmth. ? Pus or a bad smell. General instructions  Do not cross your legs. This may decrease blood flow to your feet.  Do not use heating pads or hot water bottles on your feet. They may burn your skin. If you have lost feeling in your feet or legs, you may not know this is happening until it is too late.  Protect your feet from hot and cold by wearing shoes, such as at the beach or on hot pavement.  Schedule a complete foot exam at least once a year (annually) or more often if you have foot problems. If you have foot problems, report any cuts, sores, or bruises to your health care provider immediately. Contact a health care provider if:  You have a medical condition that increases your risk of infection and you have any cuts, sores, or bruises on your feet.  You have an injury that is not   healing.  You have redness on your legs or feet.  You feel burning or tingling in your legs or feet.  You have pain or cramps in your legs and feet.  Your legs or feet are numb.  Your feet always feel cold.  You have pain around a toenail. Get help right away if:  You have a wound, scrape, corn, or callus on your foot and: ? You have pain, swelling, or redness that gets worse. ? You have fluid or blood coming from the wound, scrape, corn, or callus. ? Your wound, scrape, corn, or callus feels warm to the touch. ? You have pus or a bad smell coming from the wound, scrape, corn, or callus. ? You have a fever. ? You have a red line going up your leg. Summary  Check your feet every day  for cuts, sores, red spots, swelling, and blisters.  Moisturize feet and legs daily.  Wear shoes that fit properly and have enough cushioning.  If you have foot problems, report any cuts, sores, or bruises to your health care provider immediately.  Schedule a complete foot exam at least once a year (annually) or more often if you have foot problems. This information is not intended to replace advice given to you by your health care provider. Make sure you discuss any questions you have with your health care provider. Document Revised: 04/17/2019 Document Reviewed: 08/26/2016 Elsevier Patient Education  2020 [employer]  

## 2019-08-31 NOTE — Progress Notes (Addendum)
Subjective: George Franco presents today referred by [name], MD for diabetic foot evaluation. Patient relates 20 year history of diabetes. Patient states he has been having problems with his right great toe which was tender.He states part of the toenail lifted and he went to see his PCP. He was prescribed doxycycline 100 mg po twice daily and Mupirocin Ointment. He denies any trauma to area. Denies any fever, chills, nightsweats, nausea or vomiting.  Past Medical History:  Diagnosis Date  . At risk for sleep apnea    STOP-BANG= 5     SENT TO PCP 06-02-2015  . BPH (benign prostatic hyperplasia)   . CTS (carpal tunnel syndrome)   . CTS (carpal tunnel syndrome)   . ED (erectile dysfunction) of organic origin   . Hyperlipidemia   . Hypertension   . Hypogonadism in male   . Knee pain    pt said he doesn't know anything about this  . Lower urinary tract symptoms (LUTS)   . LVH (left ventricular hypertrophy)   . Persistent vertigo of central origin   . Prostate cancer [facility]) urologist-  dr ottelin/  oncologist-  [name]   Stage T1c, Gleason 4+3, PSA 4.48,  vol 32.9cc--  External RXT complete 05-08-2015  . Stroke ([place])   . Type 2 diabetes mellitus ([place])   . Vestibular neuropathy   . Wears glasses      Patient Active Problem List   Diagnosis Date Noted  . Lacunar stroke ([place]) 06/05/2019  . BPPV (benign paroxysmal positional vertigo) 05/13/2019  . Abnormal CT scan of lung - RLL ground glass density 01/26/2018  . Malignant neoplasm of prostate ([place]) 01/26/2015  . Dizziness and giddiness 11/18/2014  . Essential hypertension, benign 11/18/2014     Past Surgical History:  Procedure Laterality Date  . COLONOSCOPY    . PROSTATE BIOPSY    . RADIOACTIVE SEED IMPLANT N/A 06/05/2015   Procedure: RADIOACTIVE SEED IMPLANT/BRACHYTHERAPY IMPLANT;  Surgeon: [name], MD;  Location: [place];  Service: Urology;  Laterality: N/A;  . ROTATOR CUFF REPAIR Right 2014  .  UMBILICAL HERNIA REPAIR  1990's  . UPPER GASTROINTESTINAL ENDOSCOPY    . wisdom teeth extrat       Current Outpatient Medications on File Prior to Visit  Medication Sig Dispense Refill  . aspirin EC 81 MG tablet Take 1 tablet (81 mg total) by mouth daily. 30 tablet 0  . benazepril (LOTENSIN) 40 MG tablet Take 40 mg by mouth daily.    Marland Kitchen ezetimibe (ZETIA) 10 MG tablet Take 10 mg by mouth daily.    Marland Kitchen FARXIGA 10 MG TABS tablet Take 10 mg by mouth daily.    . Insulin Degludec (TRESIBA) 100 UNIT/ML SOLN Inject 20 Units into the skin daily.    Marland Kitchen loperamide (IMODIUM) 2 MG capsule Take 2 mg by mouth every 6 (six) hours as needed for constipation.  0  . meclizine (ANTIVERT) 25 MG tablet Take 1 tablet (25 mg total) by mouth 3 (three) times daily as needed for dizziness. 30 tablet 2  . metoCLOPramide (REGLAN) 10 MG tablet Take 1 tablet (10 mg total) by mouth every 6 (six) hours. 30 tablet 0  . NOVOLOG FLEXPEN 100 UNIT/ML FlexPen Inject 20 Units into the skin 3 (three) times daily.  6  . ondansetron (ZOFRAN-ODT) 4 MG disintegrating tablet Take 4 mg by mouth every 4 (four) hours as needed for nausea/vomiting.  0  . ONETOUCH VERIO test strip USE TO TEST BLOOD SUGAR  3 TIMES A DAY PRIOR TO MEALS AND RECORD    . pantoprazole (PROTONIX) 40 MG tablet Take 1 tablet (40 mg total) by mouth daily. 90 tablet 3  . rosuvastatin (CRESTOR) 20 MG tablet Take 20 mg by mouth daily.    Marland Kitchen zolpidem (AMBIEN) 5 MG tablet Take 5 mg by mouth at bedtime.     Current Facility-Administered Medications on File Prior to Visit  Medication Dose Route Frequency Provider Last Rate Last Admin  . 0.9 %  sodium chloride infusion  500 mL Intravenous Once Jackquline Denmark, MD         No Known Allergies   Social History   Occupational History  . Occupation: Glass blower/designer    Employer: [employer]  Tobacco Use  . Smoking status: Current Some Day Smoker    Packs/day: 0.50    Years: 20.00    Pack years: 10.00    Types: Cigarettes  .  Smokeless tobacco: Never Used  Substance and Sexual Activity  . Alcohol use: Yes    Alcohol/week: 0.0 standard drinks    Comment: occasional  . Drug use: No  . Sexual activity: Yes    Partners: Female     Family History  Problem Relation Age of Onset  . Prostate cancer Father   . Diabetes Father   . Diabetes Mother   . Diabetes Brother   . Colon cancer Neg Hx   . Esophageal cancer Neg Hx   . Liver cancer Neg Hx   . Pancreatic cancer Neg Hx   . Rectal cancer Neg Hx   . Stomach cancer Neg Hx      Immunization History  Administered Date(s) Administered  . Td 02/05/2016    Objective: 64 y.o. AAM, obese in NAD. AAO x 3.   Vascular Examination:  capillary fill time to digits <3s b/l, palpable DP pulses b/l, faintly palpable PT pulses b/l, pedal hair present b/l and skin temperature gradient within normal limits b/l   Pilot testing of extremities revealed dx of PAD.  Dermatological Examination: Pedal skin with normal turgor, texture and tone bilaterally., No open wounds bilaterally. and No interdigital macerations bilaterally.  Onychomycosis of the toenails b/l with elongation, dystrophy, discoloration, thickening, subungual debris and pain to palpation of nail beds.  Right great toe with embedded nailplate at distal tip with tenderness to palpation. Serous drainage noted. He does have postinflammatory hyperpigmentation to digit and cap fill time remains good. No ischemia, no gangrene.   Musculoskeletal: normal muscle strength 5/5 to all lower extremity muscle groups bilaterally, no gross bony deformities bilaterally and no pain crepitus or joint limitation noted with ROM  Neurological: sensation absent with 10g monofilament and vibratory sensation absent  Assessment: 1. Open wound of right great toe, initial encounter   2. Diabetic peripheral neuropathy associated with type 2 diabetes mellitus (HCC)   3. Pain due to onychomycosis of toenails of both feet       Plan: Discussed diabetic foot care principles. Literature dispensed on today. -Toenails 1-5 b/l were debrided in length and girth without iatrogenic bleeding. Right great toe cleansed with wound cleanser. Triple antibiotic ointment and band-aid applied. Continue doxycycline until all are gone. Continue Mupirocin Ointment and band-aid to digit once daily.  Due to abnormal pilot testing in office, will refer him for evaluation by vascular.  -Patient to continue soft, supportive shoe gear daily. -Patient to report any pedal injuries to medical professional immediately. -Patient/POA to call should there be question/concern in the interim.  Return in about 1 week (around 09/02/2019).

## 2019-09-02 ENCOUNTER — Telehealth: Payer: Self-pay | Admitting: *Deleted

## 2019-09-02 ENCOUNTER — Other Ambulatory Visit: Payer: Self-pay

## 2019-09-02 ENCOUNTER — Encounter: Payer: Self-pay | Admitting: Podiatry

## 2019-09-02 ENCOUNTER — Ambulatory Visit (INDEPENDENT_AMBULATORY_CARE_PROVIDER_SITE_OTHER): Payer: BLUE CROSS/BLUE SHIELD | Admitting: Podiatry

## 2019-09-02 DIAGNOSIS — I739 Peripheral vascular disease, unspecified: Secondary | ICD-10-CM

## 2019-09-02 DIAGNOSIS — E119 Type 2 diabetes mellitus without complications: Secondary | ICD-10-CM

## 2019-09-02 DIAGNOSIS — E1142 Type 2 diabetes mellitus with diabetic polyneuropathy: Secondary | ICD-10-CM

## 2019-09-02 DIAGNOSIS — S91101A Unspecified open wound of right great toe without damage to nail, initial encounter: Secondary | ICD-10-CM | POA: Diagnosis not present

## 2019-09-02 NOTE — Telephone Encounter (Signed)
-----   Message from Marzetta Board, DPM sent at 08/31/2019  6:47 PM EST ----- Please refer to vascular for arterial dopplers and ABIs. Noted to have abnormal studies which revealed PAD in office. Healing wound right great toe. Thanks,Dr. Darnell Level.

## 2019-09-02 NOTE — Patient Instructions (Signed)
Diabetes Mellitus and Foot Care Foot care is an important part of your health, especially when you have diabetes. Diabetes may cause you to have problems because of poor blood flow (circulation) to your feet and legs, which can cause your skin to:  Become thinner and drier.  Break more easily.  Heal more slowly.  Peel and crack. You may also have nerve damage (neuropathy) in your legs and feet, causing decreased feeling in them. This means that you may not notice minor injuries to your feet that could lead to more serious problems. Noticing and addressing any potential problems early is the best way to prevent future foot problems. How to care for your feet Foot hygiene  Wash your feet daily with warm water and mild soap. Do not use hot water. Then, pat your feet and the areas between your toes until they are completely dry. Do not soak your feet as this can dry your skin.  Trim your toenails straight across. Do not dig under them or around the cuticle. File the edges of your nails with an emery board or nail file.  Apply a moisturizing lotion or petroleum jelly to the skin on your feet and to dry, brittle toenails. Use lotion that does not contain alcohol and is unscented. Do not apply lotion between your toes. Shoes and socks  Wear clean socks or stockings every day. Make sure they are not too tight. Do not wear knee-high stockings since they may decrease blood flow to your legs.  Wear shoes that fit properly and have enough cushioning. Always look in your shoes before you put them on to be sure there are no objects inside.  To break in new shoes, wear them for just a few hours a day. This prevents injuries on your feet. Wounds, scrapes, corns, and calluses  Check your feet daily for blisters, cuts, bruises, sores, and redness. If you cannot see the bottom of your feet, use a mirror or ask someone for help.  Do not cut corns or calluses or try to remove them with medicine.  If you  find a minor scrape, cut, or break in the skin on your feet, keep it and the skin around it clean and dry. You may clean these areas with mild soap and water. Do not clean the area with peroxide, alcohol, or iodine.  If you have a wound, scrape, corn, or callus on your foot, look at it several times a day to make sure it is healing and not infected. Check for: ? Redness, swelling, or pain. ? Fluid or blood. ? Warmth. ? Pus or a bad smell. General instructions  Do not cross your legs. This may decrease blood flow to your feet.  Do not use heating pads or hot water bottles on your feet. They may burn your skin. If you have lost feeling in your feet or legs, you may not know this is happening until it is too late.  Protect your feet from hot and cold by wearing shoes, such as at the beach or on hot pavement.  Schedule a complete foot exam at least once a year (annually) or more often if you have foot problems. If you have foot problems, report any cuts, sores, or bruises to your health care provider immediately. Contact a health care provider if:  You have a medical condition that increases your risk of infection and you have any cuts, sores, or bruises on your feet.  You have an injury that is not   healing.  You have redness on your legs or feet.  You feel burning or tingling in your legs or feet.  You have pain or cramps in your legs and feet.  Your legs or feet are numb.  Your feet always feel cold.  You have pain around a toenail. Get help right away if:  You have a wound, scrape, corn, or callus on your foot and: ? You have pain, swelling, or redness that gets worse. ? You have fluid or blood coming from the wound, scrape, corn, or callus. ? Your wound, scrape, corn, or callus feels warm to the touch. ? You have pus or a bad smell coming from the wound, scrape, corn, or callus. ? You have a fever. ? You have a red line going up your leg. Summary  Check your feet every day  for cuts, sores, red spots, swelling, and blisters.  Moisturize feet and legs daily.  Wear shoes that fit properly and have enough cushioning.  If you have foot problems, report any cuts, sores, or bruises to your health care provider immediately.  Schedule a complete foot exam at least once a year (annually) or more often if you have foot problems. This information is not intended to replace advice given to you by your health care provider. Make sure you discuss any questions you have with your health care provider. Document Revised: 04/17/2019 Document Reviewed: 08/26/2016 Elsevier Patient Education  2020 [employer]  

## 2019-09-02 NOTE — Telephone Encounter (Signed)
Faxed orders to [facility]. 

## 2019-09-03 NOTE — Progress Notes (Signed)
Subjective:   [name] presents for continued care of wound right hallux.  Patient has not been performing daily dressing changes to right hallux. Pt states he cannot find his tube of Mupirocin Ointment. He states his puppy may have taken it and hid it. He has not changed dressing since his last visit here with me. Despite circumstances, he states toe feels better. Denies any drainage, swelling, redness or pain from digit. He has completed antibiotics prescribed by Dr. Forde Dandy. Pt. denies any new complaints.  Patient denies any fever, chills, nightsweats, nausea or vomiting.  Current Outpatient Medications on File Prior to Visit  Medication Sig Dispense Refill  . aspirin EC 81 MG tablet Take 1 tablet (81 mg total) by mouth daily. 30 tablet 0  . benazepril (LOTENSIN) 40 MG tablet Take 40 mg by mouth daily.    Marland Kitchen doxycycline (VIBRA-TABS) 100 MG tablet Take 100 mg by mouth 2 (two) times daily.    Marland Kitchen ezetimibe (ZETIA) 10 MG tablet Take 10 mg by mouth daily.    Marland Kitchen FARXIGA 10 MG TABS tablet Take 10 mg by mouth daily.    . Insulin Degludec (TRESIBA) 100 UNIT/ML SOLN Inject 20 Units into the skin daily.    Marland Kitchen loperamide (IMODIUM) 2 MG capsule Take 2 mg by mouth every 6 (six) hours as needed for constipation.  0  . meclizine (ANTIVERT) 25 MG tablet Take 1 tablet (25 mg total) by mouth 3 (three) times daily as needed for dizziness. 30 tablet 2  . metoCLOPramide (REGLAN) 10 MG tablet Take 1 tablet (10 mg total) by mouth every 6 (six) hours. 30 tablet 0  . mupirocin ointment (BACTROBAN) 2 % APPLY TO TOE TWICE DAILY    . NOVOLOG FLEXPEN 100 UNIT/ML FlexPen Inject 20 Units into the skin 3 (three) times daily.  6  . ondansetron (ZOFRAN-ODT) 4 MG disintegrating tablet Take 4 mg by mouth every 4 (four) hours as needed for nausea/vomiting.  0  . ONETOUCH VERIO test strip USE TO TEST BLOOD SUGAR 3 TIMES A DAY PRIOR TO MEALS AND RECORD    . pantoprazole (PROTONIX) 40 MG tablet Take 1 tablet (40 mg total) by mouth  daily. 90 tablet 3  . rosuvastatin (CRESTOR) 20 MG tablet Take 20 mg by mouth daily.    Marland Kitchen zolpidem (AMBIEN) 5 MG tablet Take 5 mg by mouth at bedtime.     Current Facility-Administered Medications on File Prior to Visit  Medication Dose Route Frequency Provider Last Rate Last Admin  . 0.9 %  sodium chloride infusion  500 mL Intravenous Once Jackquline Denmark, MD        No Known Allergies   Objective:   Vascular Examination: There were no vitals filed for this visit.  Capillary refill time <3 seconds x 10 digits.  Dorsalis pedis pulses palpable b/l.  Posterior tibial pulses faintly palpable b/l.  Digital hair absent x 10 digits.  Skin temperature gradient WNL b/l.  Dermatological Examination: Skin with normal turgor, texture and tone b/l.  No interdigital macerations b/l.  Toenails 1-5 b/l mycotic and recently debrided.  Wound ocated dorsal nailbed right hallux: Area healing well and filling in. Now nearly healed. Hyperpigmentation distal tip right hallux resolving and digit has lighter appearance on today. No edema, no erythema, no drainage, no flocculence, no ischemia, no gangrene, no odor.  Musculoskeletal: Muscle strength 5/5 to all LE muscle groups bilaterally.  No gross bony abnormalities b/l.  No crepitus or pain with active/passive ROM of joints.  Neurological: Sensation absent bilaterally with 10 gram monofilament.  Vibratory sensation decreased b/l.  Proprioception intact b/l.  Assessment:   1.  Open wound right hallux, nearly healed  2.    NIDDM with peripheral neuropathy 3.    Encounter for diabetic foot exam  Plan: 1. Patient given samples of Polysporin Ointment as pharmacy will not refill Mupirocin Ointment because it's too soon. He is to apply ointment to digit once daily for one week and stop. 2. Ordered arterial studies/ABIs due to pilot testing in office indicating PAD. 3. Patient is to follow up 8 weeks. 4. Patient instructed to report to  emergency department with worsening appearance of ulcer/toe/foot, increased pain, foul odor, increased redness, swelling, drainage, fever, chills, nightsweats, nausea, vomiting, increased blood sugar.  5. Patient/POA related understanding.

## 2019-09-06 ENCOUNTER — Encounter (HOSPITAL_COMMUNITY): Payer: BC Managed Care – PPO

## 2019-09-12 ENCOUNTER — Ambulatory Visit (HOSPITAL_COMMUNITY): Payer: Self-pay

## 2019-09-20 ENCOUNTER — Ambulatory Visit (HOSPITAL_COMMUNITY): Payer: Self-pay

## 2019-09-30 ENCOUNTER — Other Ambulatory Visit: Payer: Self-pay

## 2019-09-30 ENCOUNTER — Encounter (INDEPENDENT_AMBULATORY_CARE_PROVIDER_SITE_OTHER): Payer: Self-pay

## 2019-09-30 ENCOUNTER — Ambulatory Visit (HOSPITAL_COMMUNITY)
Admission: RE | Admit: 2019-09-30 | Discharge: 2019-09-30 | Disposition: A | Discharge status: Home / Self Care | Payer: No Typology Code available for payment source | Source: Ambulatory Visit | Attending: Internal Medicine | Admitting: Internal Medicine

## 2019-09-30 DIAGNOSIS — E1142 Type 2 diabetes mellitus with diabetic polyneuropathy: Secondary | ICD-10-CM | POA: Diagnosis not present

## 2019-09-30 DIAGNOSIS — S91101A Unspecified open wound of right great toe without damage to nail, initial encounter: Secondary | ICD-10-CM | POA: Diagnosis not present

## 2019-09-30 DIAGNOSIS — I739 Peripheral vascular disease, unspecified: Secondary | ICD-10-CM | POA: Insufficient documentation

## 2019-10-28 ENCOUNTER — Ambulatory Visit: Payer: BLUE CROSS/BLUE SHIELD | Admitting: Podiatry

## 2019-11-13 ENCOUNTER — Ambulatory Visit: Payer: No Typology Code available for payment source | Admitting: Physical Therapy

## 2020-02-14 ENCOUNTER — Ambulatory Visit (INDEPENDENT_AMBULATORY_CARE_PROVIDER_SITE_OTHER): Payer: No Typology Code available for payment source

## 2020-02-14 ENCOUNTER — Ambulatory Visit (INDEPENDENT_AMBULATORY_CARE_PROVIDER_SITE_OTHER): Payer: No Typology Code available for payment source | Admitting: Podiatry

## 2020-02-14 ENCOUNTER — Encounter: Payer: Self-pay | Admitting: Podiatry

## 2020-02-14 DIAGNOSIS — E1142 Type 2 diabetes mellitus with diabetic polyneuropathy: Secondary | ICD-10-CM | POA: Diagnosis not present

## 2020-02-14 DIAGNOSIS — I96 Gangrene, not elsewhere classified: Secondary | ICD-10-CM

## 2020-02-14 DIAGNOSIS — I739 Peripheral vascular disease, unspecified: Secondary | ICD-10-CM

## 2020-02-14 MED ORDER — DOXYCYCLINE HYCLATE 100 MG PO TABS: 100.0000 mg | ORAL_TABLET | Freq: Two times a day (BID) | ORAL | 0 refills | Status: DC

## 2020-02-18 ENCOUNTER — Telehealth: Payer: Self-pay | Admitting: *Deleted

## 2020-02-18 DIAGNOSIS — S91101A Unspecified open wound of right great toe without damage to nail, initial encounter: Secondary | ICD-10-CM

## 2020-02-18 DIAGNOSIS — I739 Peripheral vascular disease, unspecified: Secondary | ICD-10-CM

## 2020-02-18 DIAGNOSIS — E1142 Type 2 diabetes mellitus with diabetic polyneuropathy: Secondary | ICD-10-CM

## 2020-02-18 DIAGNOSIS — I96 Gangrene, not elsewhere classified: Secondary | ICD-10-CM

## 2020-02-18 NOTE — Telephone Encounter (Signed)
-----   Message from Felipa Furnace, DPM sent at 02/18/2020  6:19 AM EDT ----- Regarding: ABIs PVR stat Hi Tarique Loveall,  Would you be able to put an order for ABIs PVRs as this is more of an urgent nature given that he has gangrene to the right hallux that is acute onset.  Thank you

## 2020-02-18 NOTE — Progress Notes (Addendum)
Subjective:  Patient ID: George Franco, male    DOB: 1955/10/02,  MRN: [id number]  Chief Complaint  Patient presents with  . Toe Pain    right foot ,great toenail is lifting ,dark area around the top of toe, possible callus underneath    64 y.o. male presents with the above complaint.  Patient presents with discoloration to the right distal tip of the hallux.  Patient states this started turning dark his nail was lifting off.  Has been going for 2 weeks has progressive gotten worse.  He denies any trauma to the area.  Patient has been treating his soap warm water.  And antibiotic ointment.  He states it hurts a little bit but only to touch.  He has not seen anyone else prior to see me for this acute problem.  He wants to make sure that there is no ulceration he is diabetic with unknown A1c.  His pain scale is dull achy and is 1 out of 10.   Review of Systems: Negative except as noted in the HPI. Denies N/V/F/Ch.  Past Medical History:  Diagnosis Date  . At risk for sleep apnea    STOP-BANG= 5     SENT TO PCP 06-02-2015  . BPH (benign prostatic hyperplasia)   . CTS (carpal tunnel syndrome)   . CTS (carpal tunnel syndrome)   . ED (erectile dysfunction) of organic origin   . Hyperlipidemia   . Hypertension   . Hypogonadism in male   . Knee pain    pt said he doesn't know anything about this  . Lower urinary tract symptoms (LUTS)   . LVH (left ventricular hypertrophy)   . Persistent vertigo of central origin   . Prostate cancer [facility]) urologist-  dr ottelin/  oncologist-  [name]   Stage T1c, Gleason 4+3, PSA 4.48,  vol 32.9cc--  External RXT complete 05-08-2015  . Stroke ([place])   . Type 2 diabetes mellitus ([place])   . Vestibular neuropathy   . Wears glasses     Current Outpatient Medications:  .  aspirin EC 81 MG tablet, Take 1 tablet (81 mg total) by mouth daily., Disp: 30 tablet, Rfl: 0 .  benazepril (LOTENSIN) 40 MG tablet, Take 40 mg by mouth daily., Disp: , Rfl:  .   doxycycline (VIBRA-TABS) 100 MG tablet, Take 100 mg by mouth 2 (two) times daily., Disp: , Rfl:  .  doxycycline (VIBRA-TABS) 100 MG tablet, Take 1 tablet (100 mg total) by mouth 2 (two) times daily., Disp: 28 tablet, Rfl: 0 .  ezetimibe (ZETIA) 10 MG tablet, Take 10 mg by mouth daily., Disp: , Rfl:  .  FARXIGA 10 MG TABS tablet, Take 10 mg by mouth daily., Disp: , Rfl:  .  Insulin Degludec (TRESIBA) 100 UNIT/ML SOLN, Inject 20 Units into the skin daily., Disp: , Rfl:  .  loperamide (IMODIUM) 2 MG capsule, Take 2 mg by mouth every 6 (six) hours as needed for constipation., Disp: , Rfl: 0 .  meclizine (ANTIVERT) 25 MG tablet, Take 1 tablet (25 mg total) by mouth 3 (three) times daily as needed for dizziness., Disp: 30 tablet, Rfl: 2 .  metoCLOPramide (REGLAN) 10 MG tablet, Take 1 tablet (10 mg total) by mouth every 6 (six) hours., Disp: 30 tablet, Rfl: 0 .  mupirocin ointment (BACTROBAN) 2 %, APPLY TO TOE TWICE DAILY, Disp: , Rfl:  .  NOVOLOG FLEXPEN 100 UNIT/ML FlexPen, Inject 20 Units into the skin 3 (three) times daily., Disp: ,  Rfl: 6 .  ondansetron (ZOFRAN-ODT) 4 MG disintegrating tablet, Take 4 mg by mouth every 4 (four) hours as needed for nausea/vomiting., Disp: , Rfl: 0 .  ONETOUCH VERIO test strip, USE TO TEST BLOOD SUGAR 3 TIMES A DAY PRIOR TO MEALS AND RECORD, Disp: , Rfl:  .  pantoprazole (PROTONIX) 40 MG tablet, Take 1 tablet (40 mg total) by mouth daily., Disp: 90 tablet, Rfl: 3 .  rosuvastatin (CRESTOR) 20 MG tablet, Take 20 mg by mouth daily., Disp: , Rfl:  .  zolpidem (AMBIEN) 5 MG tablet, Take 5 mg by mouth at bedtime., Disp: , Rfl:   Current Facility-Administered Medications:  .  0.9 %  sodium chloride infusion, 500 mL, Intravenous, Once, Jackquline Denmark, MD  Social History   Tobacco Use  Smoking Status Current Some Day Smoker  . Packs/day: 0.50  . Years: 20.00  . Pack years: 10.00  . Types: Cigarettes  Smokeless Tobacco Never Used    No Known Allergies Objective:    There were no vitals filed for this visit. There is no height or weight on file to calculate BMI. Constitutional Well developed. Well nourished.  Vascular Dorsalis pedis pulses non palpable bilaterally. Posterior tibial pulses non palpable bilaterally. Capillary refill normal to all digits.  No cyanosis or clubbing noted. Pedal hair growth normal.  Neurologic Normal speech. Oriented to person, place, and time. Epicritic sensation to light touch grossly present bilaterally.  Dermatologic Nails well groomed and normal in appearance. No open wounds. No skin lesions.  Orthopedic:  Right hallux distal necrosis noted dry stable in nature without any malodor, cellulitis, purulent drainage noted.  These findings are consistent with gangrene.  No underlying ulcerations noted.  No crepitus noted.  No signs of wet gangrene noted.   Radiographs: 3 views of skeletally mature adult right foot: No concern for osteomyelitis or bony destruction noted on the plain films.  No soft tissue emphysema or gas noted on the plain films. Assessment:   1. Gangrene of toe of right foot ([place])   2. Diabetic peripheral neuropathy associated with type 2 diabetes mellitus ([place])   3. PAD (peripheral artery disease) ([place])    Plan:  Patient was evaluated and treated and all questions answered.  Right hallux gangrene actively demarcating -I explained to patient the etiology of gangrene and various treatment options were discussed.  Patient also has a component of peripheral arterial disease that is likely playing a major role in beginning changes of micro vessel disease and lowering the flow to the right hallux.  At this point from my standpoint given that is not clinically infected will I will continue to monitor and watch.  I have instructed him to do Betadine wet-to-dry dressing changes.  Do not get the foot wet in the shower. -Patient will also benefit from vascular work-up with ABIs PVRs and follow-up with vascular  surgery. -Surgical shoe was dispensed to maintain offloading. -Continue Betadine wet-to-dry dressing changes 3 times a week. -Doxycycline was dispensed for skin and soft tissue prophylaxis. -Patient is a high risk of losing that digit versus the foot if this continues to get worse without any aggressive intervention. -I have asked him to go to the emergency room if the wound continues to get worse and or if the gangrene becomes wet.  Patient states understanding  No follow-ups on file.

## 2020-02-18 NOTE — Telephone Encounter (Signed)
Orders faxed to [facility]. 

## 2020-02-20 ENCOUNTER — Ambulatory Visit (HOSPITAL_COMMUNITY)
Admission: RE | Admit: 2020-02-20 | Discharge: 2020-02-20 | Disposition: A | Discharge status: Home / Self Care | Payer: No Typology Code available for payment source | Source: Ambulatory Visit | Attending: Cardiovascular Disease | Admitting: Cardiovascular Disease

## 2020-02-20 ENCOUNTER — Other Ambulatory Visit: Payer: Self-pay

## 2020-02-20 DIAGNOSIS — S91101A Unspecified open wound of right great toe without damage to nail, initial encounter: Secondary | ICD-10-CM | POA: Diagnosis not present

## 2020-02-20 DIAGNOSIS — I739 Peripheral vascular disease, unspecified: Secondary | ICD-10-CM | POA: Diagnosis not present

## 2020-02-20 DIAGNOSIS — I96 Gangrene, not elsewhere classified: Secondary | ICD-10-CM

## 2020-02-20 DIAGNOSIS — E1142 Type 2 diabetes mellitus with diabetic polyneuropathy: Secondary | ICD-10-CM | POA: Diagnosis not present

## 2020-02-23 IMAGING — CT CT HEAD WITHOUT AND WITH CONTRAST
1 of 2 series · 13 of 30 positions shown, 17 images · IV contrast (iopamidol)
Comparison: 12/01/2014 brain MRI

CLINICAL DATA: Vertigo and headache.  History of prostate cancer

EXAM:
CT HEAD WITHOUT AND WITH CONTRAST
TECHNIQUE: Contiguous axial images were obtained from the base of the skull
through the vertex without and with intravenous contrast
CONTRAST:  75mL HNPHXV-W77 IOPAMIDOL (HNPHXV-W77) INJECTION 61%

[Series 2: head w/(date) · axial · 0.45mm/px · z∈[-138,-3]mm · 13 of 33 slices shown, 17 images]
[im 3/33  brain]
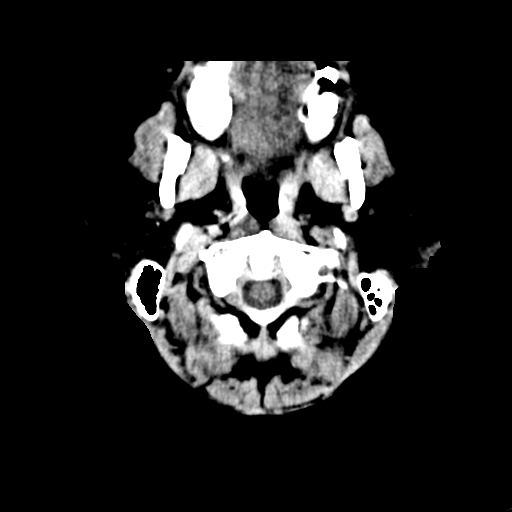
[im 3/33  bone]
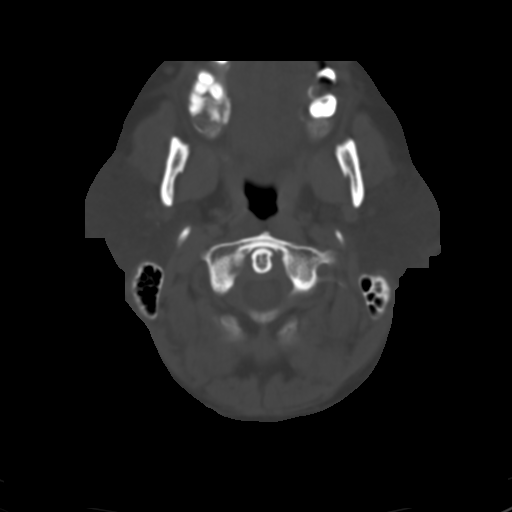
[im 5/33  brain]
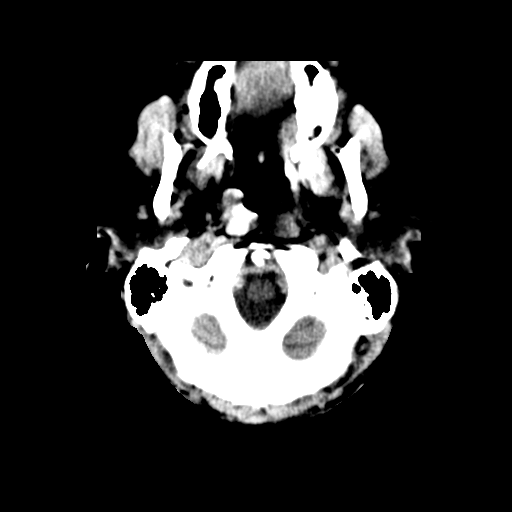
[im 7/33  brain]
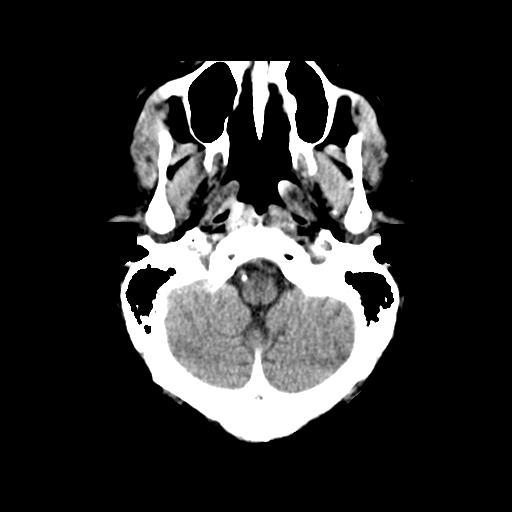
[im 10/33  brain]
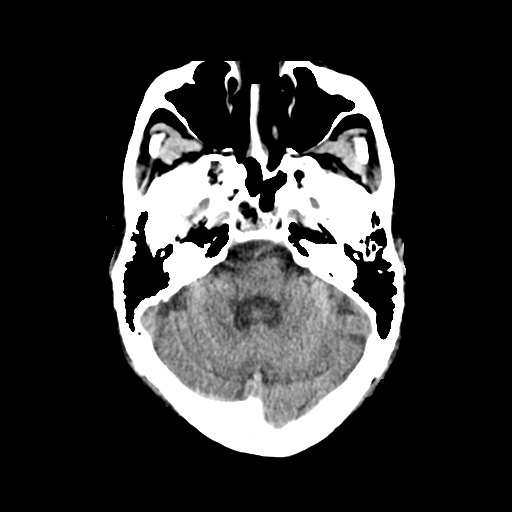
[im 12/33  brain]
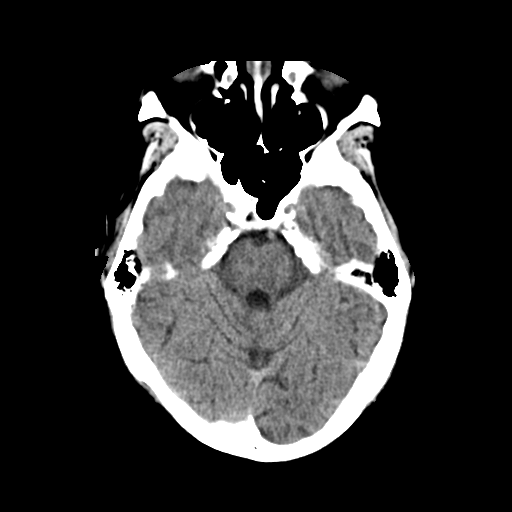
[im 12/33  bone]
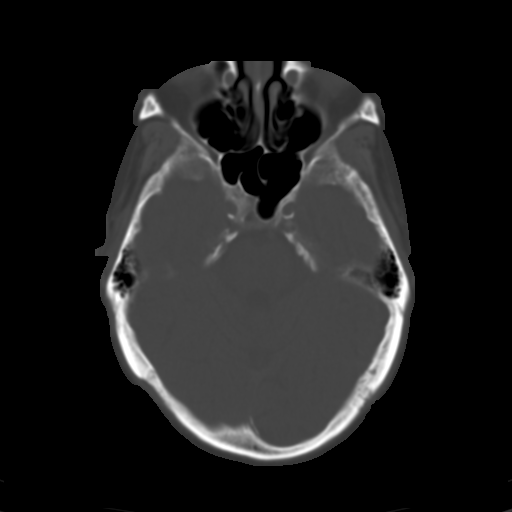
[im 14/33  brain]
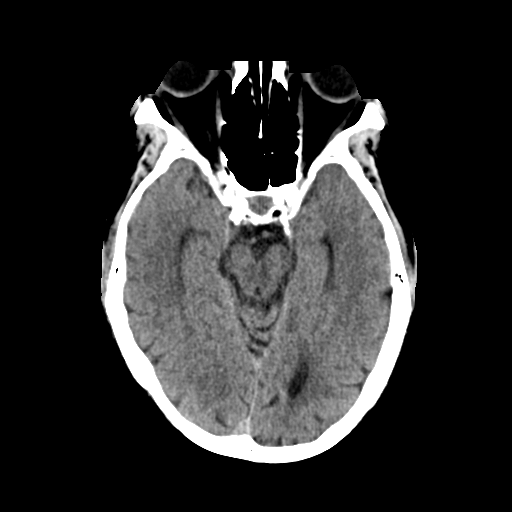
[im 17/33  brain]
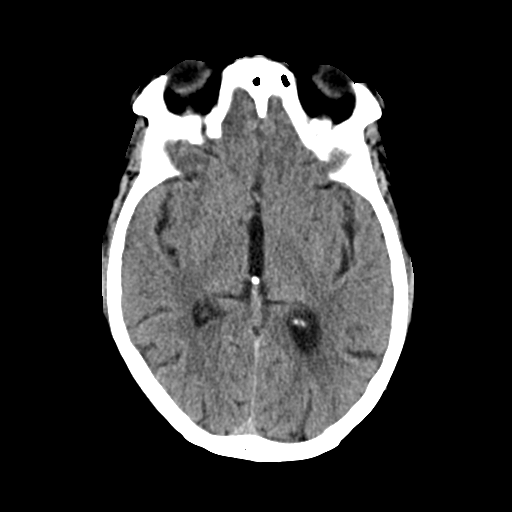
[im 19/33  brain]
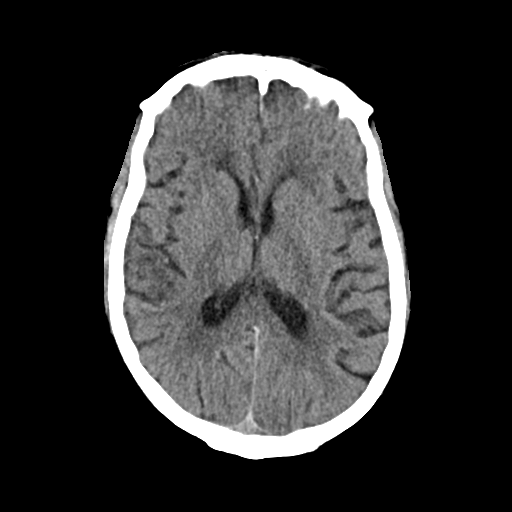
[im 21/33  brain]
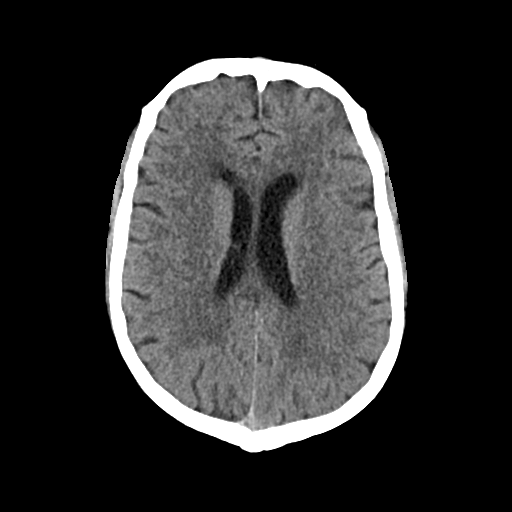
[im 21/33  bone]
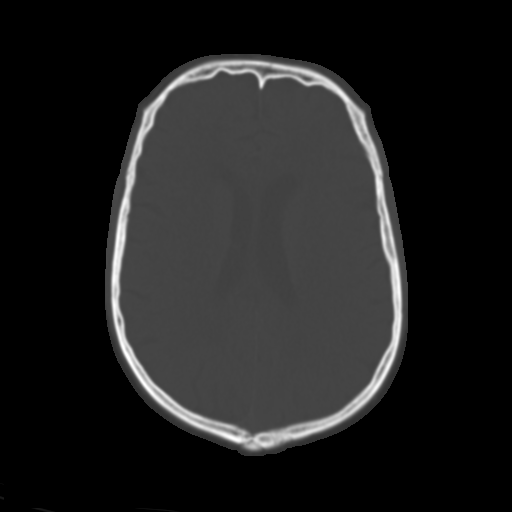
[im 23/33  brain]
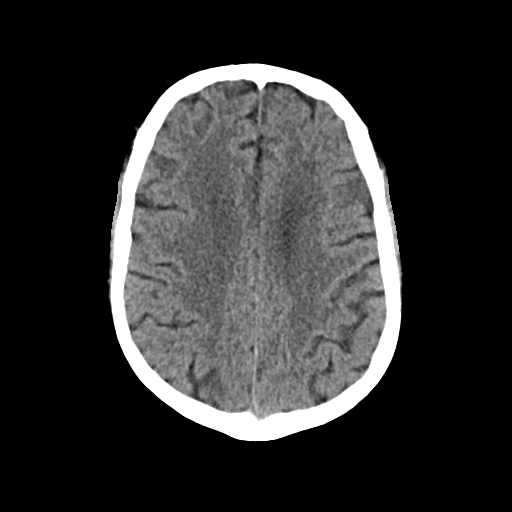
[im 26/33  brain]
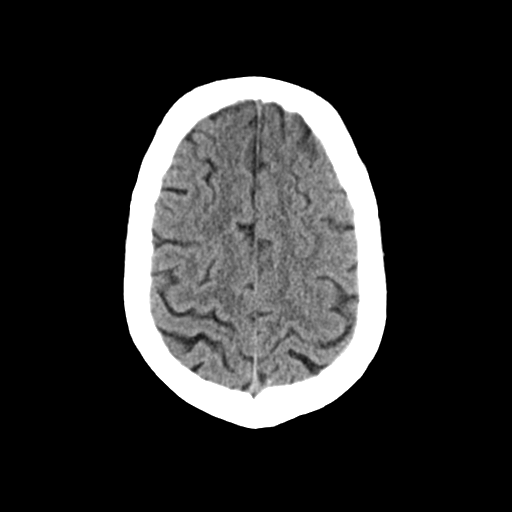
[im 28/33  brain]
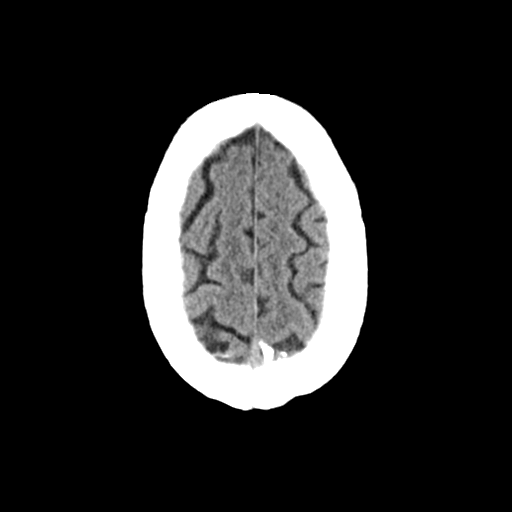
[im 30/33  brain]
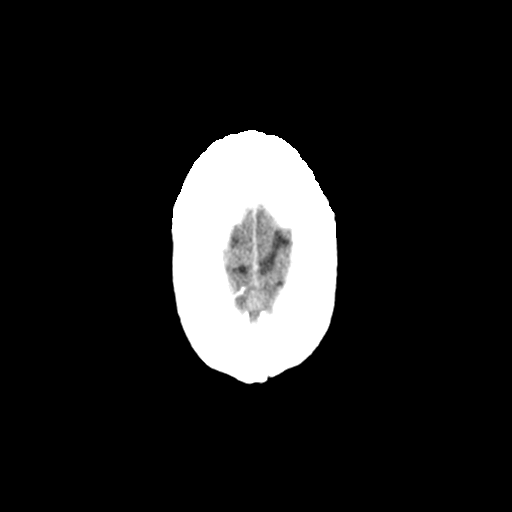
[im 30/33  bone]
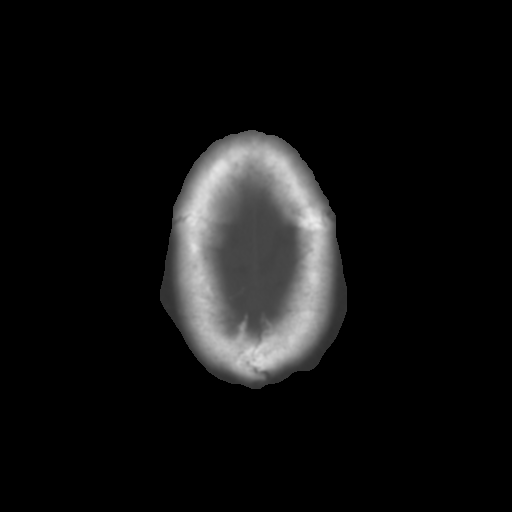

[13 of 30 positions shown; findings below may reference images not displayed]

FINDINGS: Brain: No evidence of acute infarction, hemorrhage, hydrocephalus,
extra-axial collection or mass lesion/mass effect. No abnormal
enhancement. Minor periventricular low-density attributed to chronic
microvascular ischemia.

Vascular: Atherosclerotic calcification of carotid and vertebral
arteries. Major vessels are enhancing where not obscured by calcium.

Skull: No evidence of metastatic disease.

Sinuses/Orbits: Negative
IMPRESSION: No acute finding or evidence of metastatic disease.

## 2020-02-26 ENCOUNTER — Other Ambulatory Visit: Payer: Self-pay | Admitting: Podiatry

## 2020-02-26 ENCOUNTER — Other Ambulatory Visit: Payer: Self-pay

## 2020-02-26 ENCOUNTER — Ambulatory Visit
Admission: RE | Admit: 2020-02-26 | Discharge: 2020-02-26 | Disposition: A | Discharge status: Home / Self Care | Payer: No Typology Code available for payment source | Source: Ambulatory Visit | Attending: Podiatry | Admitting: Podiatry

## 2020-02-26 ENCOUNTER — Encounter: Payer: Self-pay | Admitting: *Deleted

## 2020-02-26 DIAGNOSIS — I96 Gangrene, not elsewhere classified: Secondary | ICD-10-CM

## 2020-02-26 HISTORY — PX: IR RADIOLOGIST EVAL & MGMT: IMG5224

## 2020-02-26 NOTE — Consult Note (Signed)
Chief Complaint: Right great toe wound  Referring Physician(s): Patel,Kevin P  History of Present Illness: George Franco is a 64 y.o. male presenting urgently to [place] service for evaluation of right great toe wound, kindly referred by Dr. Boneta Lucks of [facility], for possible candidacy for lower extremity revascularization.   [name] joins [place] today via telemedicine visit.   He tells me that the right great toe wound has been present only now for a few weeks.  He has never had a wound on this foot, or on the left foot.  He denies any fevers, rigors, chills.  He tells me that he has only mild pain at the right great toe, and I cannot elicit any history of resting pain preceding this, or of claudication.   He has multiple CV risk factors, including DM II, tobacco history, HLD, HTN.    He denies any prior MI. He tells me that he had a "mini-stroke" last year treated medically.    He denies any chest pain or SOB at rest.    He had a non-invasive exam performed 02/20/20: Right ABI: 0.94 Left ABI: 0.77 Duplex describes monophasic/occluded right tibial arteries     Past Medical History:  Diagnosis Date  . At risk for sleep apnea    STOP-BANG= 5     SENT TO PCP 06-02-2015  . BPH (benign prostatic hyperplasia)   . CTS (carpal tunnel syndrome)   . CTS (carpal tunnel syndrome)   . ED (erectile dysfunction) of organic origin   . Hyperlipidemia   . Hypertension   . Hypogonadism in male   . Knee pain    pt said he doesn't know anything about this  . Lower urinary tract symptoms (LUTS)   . LVH (left ventricular hypertrophy)   . Persistent vertigo of central origin   . Prostate cancer [facility]) urologist-  dr ottelin/  oncologist-  [name]   Stage T1c, Gleason 4+3, PSA 4.48,  vol 32.9cc--  External RXT complete 05-08-2015  . Stroke ([place])   . Type 2 diabetes mellitus ([place])   . Vestibular neuropathy   . Wears glasses     Past Surgical History:  Procedure Laterality  Date  . COLONOSCOPY    . IR RADIOLOGIST EVAL & MGMT  02/26/2020  . PROSTATE BIOPSY    . RADIOACTIVE SEED IMPLANT N/A 06/05/2015   Procedure: RADIOACTIVE SEED IMPLANT/BRACHYTHERAPY IMPLANT;  Surgeon: [name], MD;  Location: [place];  Service: Urology;  Laterality: N/A;  . ROTATOR CUFF REPAIR Right 2014  . UMBILICAL HERNIA REPAIR  1990's  . UPPER GASTROINTESTINAL ENDOSCOPY    . wisdom teeth extrat      Allergies: Patient has no known allergies.  Medications: Prior to Admission medications   Medication Sig Start Date End Date Taking? Authorizing Provider  aspirin EC 81 MG tablet Take 1 tablet (81 mg total) by mouth daily. 06/05/19   Melvenia Beam, MD  benazepril (LOTENSIN) 40 MG tablet Take 40 mg by mouth daily.    [provider]  doxycycline (VIBRA-TABS) 100 MG tablet Take 100 mg by mouth 2 (two) times daily. 08/21/19   [provider]  doxycycline (VIBRA-TABS) 100 MG tablet Take 1 tablet (100 mg total) by mouth 2 (two) times daily. 02/14/20   Felipa Furnace, DPM  ezetimibe (ZETIA) 10 MG tablet Take 10 mg by mouth daily. 07/13/19   [provider]  FARXIGA 10 MG TABS tablet Take 10 mg by  mouth daily. 12/29/17   [provider]  Insulin Degludec (TRESIBA) 100 UNIT/ML SOLN Inject 20 Units into the skin daily.    [provider]  loperamide (IMODIUM) 2 MG capsule Take 2 mg by mouth every 6 (six) hours as needed for constipation. 01/08/18   [provider]  meclizine (ANTIVERT) 25 MG tablet Take 1 tablet (25 mg total) by mouth 3 (three) times daily as needed for dizziness. 05/13/19   Melvenia Beam, MD  metoCLOPramide (REGLAN) 10 MG tablet Take 1 tablet (10 mg total) by mouth every 6 (six) hours. 01/17/18   Langston Masker B, PA-C  mupirocin ointment (BACTROBAN) 2 % APPLY TO TOE TWICE DAILY 08/21/19   [provider]  NOVOLOG FLEXPEN 100 UNIT/ML FlexPen Inject 20 Units into the skin 3 (three) times daily. 12/12/17    [provider]  ondansetron (ZOFRAN-ODT) 4 MG disintegrating tablet Take 4 mg by mouth every 4 (four) hours as needed for nausea/vomiting. 01/08/18   [provider]  ONETOUCH VERIO test strip USE TO TEST BLOOD SUGAR 3 TIMES A DAY PRIOR TO MEALS AND RECORD 03/22/19   [provider]  pantoprazole (PROTONIX) 40 MG tablet Take 1 tablet (40 mg total) by mouth daily. 01/29/18   Jackquline Denmark, MD  rosuvastatin (CRESTOR) 20 MG tablet Take 20 mg by mouth daily. 07/19/19   [provider]  zolpidem (AMBIEN) 5 MG tablet Take 5 mg by mouth at bedtime. 07/14/19   [provider]     Family History  Problem Relation Age of Onset  . Prostate cancer Father   . Diabetes Father   . Diabetes Mother   . Diabetes Brother   . Colon cancer Neg Hx   . Esophageal cancer Neg Hx   . Liver cancer Neg Hx   . Pancreatic cancer Neg Hx   . Rectal cancer Neg Hx   . Stomach cancer Neg Hx     Social History   Socioeconomic History  . Marital status: Married    Spouse name: Not on file  . Number of children: 3  . Years of education: Not on file  . Highest education level: Not on file  Occupational History  . Occupation: Glass blower/designer    Employer: [employer]  Tobacco Use  . Smoking status: Current Some Day Smoker    Packs/day: 0.50    Years: 20.00    Pack years: 10.00    Types: Cigarettes  . Smokeless tobacco: Never Used  Vaping Use  . Vaping Use: Never used  Substance and Sexual Activity  . Alcohol use: Yes    Alcohol/week: 0.0 standard drinks    Comment: occasional  . Drug use: No  . Sexual activity: Yes    Partners: Female  Other Topics Concern  . 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 Difficulty of  Paying Living Expenses:   Food Insecurity:   . Worried About Charity fundraiser in the Last Year:   . Arboriculturist in the Last Year:   Transportation Needs:   . Film/video editor (Medical):   Marland Kitchen Lack of Transportation (Non-Medical):   Physical Activity:   .  Days of Exercise per Week:   . Minutes of Exercise per Session:   Stress:   . Feeling of Stress :   Social Connections:   . Frequency of Communication with Friends and Family:   . Frequency of Social Gatherings with Friends and Family:   . Attends Religious Services:   . Active Member of Clubs or Organizations:   . Attends Archivist Meetings:   Marland Kitchen Marital Status:        Review of Systems  Review of Systems: A 12 point ROS discussed and pertinent positives are indicated in the HPI above.  All other systems are negative.  Physical Exam No direct physical exam was performed (except for noted visual exam findings with Video Visits).    Vital Signs: There were no vitals taken for this visit.  Imaging: DG Foot Complete Right  Result Date: 02/14/2020 Please see detailed radiograph report in office note.  VAS [place] ABI WITH/WO TBI  Result Date: 02/21/2020 LOWER EXTREMITY DOPPLER STUDY Indications: History of PAD with gangrene to the right great toe. Patient denies              any claudication symptoms or rest pain. High Risk Factors: Hypertension, Diabetes, current smoker.  Comparison Study: In 09/2019, an arterial Doppler showed an ABI of .81 on the                   right and .91 on the left. Performing Technologist: Sharlett Iles RVT  Examination Guidelines: A complete evaluation includes at minimum, Doppler waveform signals and systolic blood pressure reading at the level of bilateral brachial, anterior tibial, and posterior tibial arteries, when vessel segments are accessible. Bilateral testing is considered an integral part of a complete examination. Photoelectric Plethysmograph (PPG) waveforms and toe  systolic pressure readings are included as required and additional duplex testing as needed. Limited examinations for reoccurring indications may be performed as noted.  ABI Findings: +---------+------------------+-----+------------------+------------------------+ Right    Rt Pressure (mmHg)IndexWaveform          Comment                  +---------+------------------+-----+------------------+------------------------+ Brachial 151                                                               +---------+------------------+-----+------------------+------------------------+ ATA      148               0.91 monophasic                                 +---------+------------------+-----+------------------+------------------------+ PTA      135               0.83 dampened                                                                   monophasic                                 +---------+------------------+-----+------------------+------------------------+  PERO     152               0.94 biphasic                                   +---------+------------------+-----+------------------+------------------------+ Great Toe122               0.75 Abnormal          Mildly abnormal waveform +---------+------------------+-----+------------------+------------------------+ +---------+-----------------+-----+------------------+-------------------------+ Left     Lt Pressure      IndexWaveform          Comment                            (mmHg)                                                            +---------+-----------------+-----+------------------+-------------------------+ Brachial 162                                                               +---------+-----------------+-----+------------------+-------------------------+ ATA      124              0.77 monophasic                                   +---------+-----------------+-----+------------------+-------------------------+ PTA      125              0.77 dampened                                                                   monophasic                                  +---------+-----------------+-----+------------------+-------------------------+ PERO     121              0.75 biphasic                                    +---------+-----------------+-----+------------------+-------------------------+ Great Toe108              0.67 Abnormal          Severely abnormal                                                          waveform                  +---------+-----------------+-----+------------------+-------------------------+ +-------+-----------+-----------+------------+------------+  ABI/TBIToday's ABIToday's TBIPrevious ABIPrevious TBI +-------+-----------+-----------+------------+------------+ Right  .94        .75        .81         .34          +-------+-----------+-----------+------------+------------+ Left   .77        .67        .91         .33          +-------+-----------+-----------+------------+------------+ Right ABIs appear essentially unchanged compared to prior study on 09/30/2019. Left ABIs appear decreased compared to prior study on 09/30/2019. Bilateral TBIs appear increased compared to prior study. The right TBI has increased by .41 and the left TBI has increased by .34.  Summary: Right: Resting right ankle-brachial index indicates mild right lower extremity arterial disease. The right toe-brachial index is normal. Left: Resting left ankle-brachial index indicates moderate left lower extremity arterial disease. The left toe-brachial index is abnormal.  *See table(s) above for measurements and observations. See LE Arterial duplex report. Vascular consult recommended. Electronically signed by Carlyle Dolly MD on 02/21/2020 at 1:33:34 PM.    Final    VAS [place] LOWER EXTREMITY ARTERIAL  DUPLEX  Result Date: 02/22/2020 LOWER EXTREMITY ARTERIAL DUPLEX STUDY Indications: History of PAD with gangrene to the right great toe. Patient denies              any claudication symptoms or rest pain. High Risk Factors: Hypertension, Diabetes, current smoker.  Current ABI: .94 on the right and .77 on the left Comparison Study: In 09/2019, a lower arterial duplex showed 30-49% stenosis in                   the right distal SFA, high end range. Occluded right distal                   PTA and right proximal and mid peroneal artery. Occluded left                   distal PTA. Performing Technologist: Sharlett Iles RVT  Examination Guidelines: A complete evaluation includes B-mode imaging, spectral Doppler, color Doppler, and power Doppler as needed of all accessible portions of each vessel. Bilateral testing is considered an integral part of a complete examination. Limited examinations for reoccurring indications may be performed as noted.  +-----------+--------+-----+--------+----------+-------------------------------+ LEFT       PSV cm/sRatioStenosisWaveform  Comments                        +-----------+--------+-----+--------+----------+-------------------------------+ CFA Prox   120                  biphasic                                  +-----------+--------+-----+--------+----------+-------------------------------+ DFA        113                  biphasic                                  +-----------+--------+-----+--------+----------+-------------------------------+ SFA Prox   89                   biphasic                                  +-----------+--------+-----+--------+----------+-------------------------------+  SFA Mid    121                  biphasic                                  +-----------+--------+-----+--------+----------+-------------------------------+ SFA Distal 85                   biphasic                                   +-----------+--------+-----+--------+----------+-------------------------------+ POP Prox   136                  biphasic                                  +-----------+--------+-----+--------+----------+-------------------------------+ POP Mid    104                  biphasic                                  +-----------+--------+-----+--------+----------+-------------------------------+ POP Distal 58                   biphasic                                  +-----------+--------+-----+--------+----------+-------------------------------+ TP Trunk   64                   biphasic                                  +-----------+--------+-----+--------+----------+-------------------------------+ ATA Prox   26                   biphasic  tortuous                        +-----------+--------+-----+--------+----------+-------------------------------+ ATA Mid    0            occluded          mid/distal segment now shows                                              occlusion compared to the prior                                           exam                            +-----------+--------+-----+--------+----------+-------------------------------+ ATA Distal 14                   monophasic                                +-----------+--------+-----+--------+----------+-------------------------------+  PTA Prox   100                  biphasic                                  +-----------+--------+-----+--------+----------+-------------------------------+ PTA Mid    0            occluded          mid segment now shows occlusion                                           compared to the prior exam      +-----------+--------+-----+--------+----------+-------------------------------+ PTA Distal 0            occluded                                          +-----------+--------+-----+--------+----------+-------------------------------+ PERO  Prox  0            occluded          proximal segment now shows                                                occlusion compared to the prior                                           exam                            +-----------+--------+-----+--------+----------+-------------------------------+ PERO Mid   40                   monophasic                                +-----------+--------+-----+--------+----------+-------------------------------+ PERO Distal35                   monophasic                                +-----------+--------+-----+--------+----------+-------------------------------+  Summary: Left: Mixed plaque throughout. No evidence of stenosis or occlusion in the CFA, DFA, SFA, popliteal artery and TPT. Tibial vessel disease; there now appears to be zero vessel run-off.  See table(s) above for measurements and observations. See ABI report. Vascular consult recommended. Electronically signed by Carlyle Dolly MD on 02/22/2020 at 4:58:00 PM.    Final    IR Radiologist Eval & Mgmt  Result Date: 02/26/2020 Please refer to notes tab for details about interventional procedure. (Op Note)   Labs:  CBC: No results for input(s): WBC, HGB, HCT, PLT in the last 8760 hours.  COAGS: No results for input(s): INR, APTT in the last 8760 hours.  BMP: No results for input(s): NA, K, CL, CO2, GLUCOSE, BUN, CALCIUM, CREATININE, GFRNONAA, GFRAA in the last 8760 hours.  Invalid  input(s): CMP  LIVER FUNCTION TESTS: No results for input(s): BILITOT, AST, ALT, ALKPHOS, PROT, ALBUMIN in the last 8760 hours.  TUMOR MARKERS: No results for input(s): AFPTM, CEA, CA199, CHROMGRNA in the last 8760 hours.  Assessment and Plan:  Assessment:  Mr Streater is a 64 yo male presenting with right great toe wound, compatible with Rutherford 3 class symptoms of CLI.      Non-invasive lower extremity exam and imaging work-up shows evidence of occlusive tibial arterial  disease, with likely falsely elevated ABI:  Calculation of the WIfI using a surrogate for the ABI of moderate severity, yields moderate risk, with high benefit of revascularization.   I had discussion with Dr. Ronny Flurry regarding anatomy, pathology/pathophysiology, natural history, and prognosis of PAD/CLI.  Informed consent regarding treatment strategies was performed which would possibly include medical management,   surgical strategy, and/or endovascular options, with risk/benefit discussion.  The indications for treatment supported by updated guidelines1, 2 were discussed..   I did emphasize the risk for tissue/limb loss if he does not heal the wound on the great toe.  Of course, given what we know regarding diabetic foot ulcers, his risk for additional amputation (without revasc) in the next 2-3 years is as great as 50%, including the contralateral leg.   Regarding endovascular options, specific risks discussed include: bleeding, infection, contrast reaction, renal injury/nephropathy, arterial injury/dissection, need for additional procedure/surgery, worsening symptoms/tissue including limb loss, cardiopulmonary collapse, death.    Regarding medical management, maximal medical therapy for reduction of risk factors is indicated as recommended by updated AHA guidelines1.  This includes anti-platelet medication, tight blood glucose control to a HbA1c < 7, tight blood pressure control, maximum-dose HMG-CoA reductase inhibitor, and smoking cessation.  Smoking cessation was addressed, with strategies including counseling and nicotine replacement.  He/she expressed [a desire to attempt cessation/no interest in pursuing treatment at this time.]   Annual flu vaccination is also recommended, with Class 1 recommendation1.   Plan: -He is going to discuss with his family and has asked that we reach out to potentially schedule case tomorrow.  - If he would like to proceed with treatment, we would schedule  aorto-peripheral angiogram and possible intervention, with Dr. Earleen Newport at [facility], [place].   -Annual flu vaccination is recommended in the setting of known PAD, in the absence of contra-indications.    ___________________________________________________________________   1Morley Kos MD, et al. 2016 AHA/ACC Guideline on the Management of Patients With Lower Extremity Peripheral Artery Disease: Executive Summary: A Report of the American College of Cardiology/American Heart Association Task Force on Clinical Practice Guidelines. J Am Coll Cardiol. 2017 Mar 21;69(11):1465-1508. doi: 10.1016/j.jacc.2016.11.008.   2 - Norgren L, et al. TASC II Working Group. Inter-society consensus for the management of peripheral arterial disease. Int Tressia Miners. 2007 Jun;26(2):81-157. Review. PubMed PMID: 72536644  3 - Hingorani A, et al. The management of diabetic foot: A clinical practice guideline by the Society for Vascular Surgery in collaboration with the [place] and the Society  for Vascular Medicine. J Vasc Surg. 2016 Feb;63(2 Suppl):3S-21S. doi: 10.1016/j.jvs.2015.10.003. PubMed PMID: 03474259.  4 - Corinna Gab, Saab FA, Luberta Mutter, Grant Ruts, [place]well Poe, Driver VR, [place], Lookstein R, van den Baldemar Lenis, Jaff MR, Guadalupe Dawn, Henao S, AlMahameed A, Katzen B. Digital Subtraction Angiography Prior to an Amputation for Critical Limb Ischemia (CLI): An Expert Recommendation Statement From the CLI Global Society to Optimize Limb Salvage. J Endovasc Ther. 2020 Aug;27(4):540-546. doi: 10.1177/1526602820928590. Epub 2020 May 29. PMID: 56387564.  Thank you for this interesting consult.  I greatly enjoyed meeting Athen Riel and look forward to participating in their care.  A copy of this report was sent to the requesting provider on this date.  Electronically Signed: Corrie Mckusick 02/26/2020, 12:49 PM   I spent a total of  60 Minutes   in remote  clinical consultation, greater than 50%  of which was counseling/coordinating care for right great toe wound, CLI/CLTI, Rutherford 5, possible angiogram and intervention.    Visit type: Audio only (telephone). Audio (no video) only due to patient's lack of internet/smartphone capability. Alternative for in-person consultation at [facility], [address] [place], [place], [place]. This visit type was conducted due to national recommendations for restrictions regarding the COVID-19 Pandemic (e.g. social distancing).  This format is felt to be most appropriate for this patient at this time.  All issues noted in this document were discussed and addressed.

## 2020-02-28 ENCOUNTER — Ambulatory Visit (INDEPENDENT_AMBULATORY_CARE_PROVIDER_SITE_OTHER): Payer: No Typology Code available for payment source | Admitting: Podiatry

## 2020-02-28 ENCOUNTER — Other Ambulatory Visit: Payer: Self-pay

## 2020-02-28 DIAGNOSIS — I96 Gangrene, not elsewhere classified: Secondary | ICD-10-CM | POA: Diagnosis not present

## 2020-02-28 DIAGNOSIS — I739 Peripheral vascular disease, unspecified: Secondary | ICD-10-CM | POA: Diagnosis not present

## 2020-02-28 DIAGNOSIS — E1142 Type 2 diabetes mellitus with diabetic polyneuropathy: Secondary | ICD-10-CM

## 2020-03-03 ENCOUNTER — Other Ambulatory Visit (HOSPITAL_COMMUNITY): Payer: Self-pay | Admitting: Interventional Radiology

## 2020-03-03 ENCOUNTER — Encounter: Payer: Self-pay | Admitting: Podiatry

## 2020-03-03 DIAGNOSIS — I70229 Atherosclerosis of native arteries of extremities with rest pain, unspecified extremity: Secondary | ICD-10-CM

## 2020-03-03 DIAGNOSIS — S91101A Unspecified open wound of right great toe without damage to nail, initial encounter: Secondary | ICD-10-CM

## 2020-03-03 NOTE — Progress Notes (Signed)
Subjective:  Patient ID: George Franco, male    DOB: 1955/12/16,  MRN: [id number]  Chief Complaint  Patient presents with   Foot Pain    pt is here for a f/u of gangrene to the right foot. Pt states that the right foot is painful to the touch. Pt also states that he has been keeping his foot dry as well.    64 y.o. male presents with the above complaint.  Patient is following up with gangrenous changes to the right hallux distal tip.  Patient states it is still demarcating.  That he has been doing Betadine wet-to-dry dressing on it.  He had his ABIs PVRs and vascular studies done.  He would like to discuss what the next treatment options are to optimize vascular flow.  He denies any other acute complaints.   Review of Systems: Negative except as noted in the HPI. Denies N/V/F/Ch.  Past Medical History:  Diagnosis Date   At risk for sleep apnea    STOP-BANG= 5     SENT TO PCP 06-02-2015   BPH (benign prostatic hyperplasia)    CTS (carpal tunnel syndrome)    CTS (carpal tunnel syndrome)    ED (erectile dysfunction) of organic origin    Hyperlipidemia    Hypertension    Hypogonadism in male    Knee pain    pt said he doesn't know anything about this   Lower urinary tract symptoms (LUTS)    LVH (left ventricular hypertrophy)    Persistent vertigo of central origin    Prostate cancer [facility]) urologist-  dr ottelin/  oncologist-  [name]   Stage T1c, Gleason 4+3, PSA 4.48,  vol 32.9cc--  External RXT complete 05-08-2015   Stroke ([place])    Type 2 diabetes mellitus ([place])    Vestibular neuropathy    Wears glasses     Current Outpatient Medications:    aspirin EC 81 MG tablet, Take 1 tablet (81 mg total) by mouth daily., Disp: 30 tablet, Rfl: 0   benazepril (LOTENSIN) 40 MG tablet, Take 40 mg by mouth daily., Disp: , Rfl:    doxycycline (VIBRA-TABS) 100 MG tablet, Take 100 mg by mouth 2 (two) times daily., Disp: , Rfl:    doxycycline (VIBRA-TABS) 100 MG tablet,  Take 1 tablet (100 mg total) by mouth 2 (two) times daily., Disp: 28 tablet, Rfl: 0   ezetimibe (ZETIA) 10 MG tablet, Take 10 mg by mouth daily., Disp: , Rfl:    FARXIGA 10 MG TABS tablet, Take 10 mg by mouth daily., Disp: , Rfl:    Insulin Degludec (TRESIBA) 100 UNIT/ML SOLN, Inject 20 Units into the skin daily., Disp: , Rfl:    loperamide (IMODIUM) 2 MG capsule, Take 2 mg by mouth every 6 (six) hours as needed for constipation., Disp: , Rfl: 0   meclizine (ANTIVERT) 25 MG tablet, Take 1 tablet (25 mg total) by mouth 3 (three) times daily as needed for dizziness., Disp: 30 tablet, Rfl: 2   metoCLOPramide (REGLAN) 10 MG tablet, Take 1 tablet (10 mg total) by mouth every 6 (six) hours., Disp: 30 tablet, Rfl: 0   mupirocin ointment (BACTROBAN) 2 %, APPLY TO TOE TWICE DAILY, Disp: , Rfl:    NOVOLOG FLEXPEN 100 UNIT/ML FlexPen, Inject 20 Units into the skin 3 (three) times daily., Disp: , Rfl: 6   ondansetron (ZOFRAN-ODT) 4 MG disintegrating tablet, Take 4 mg by mouth every 4 (four) hours as needed for nausea/vomiting., Disp: , Rfl: 0  ONETOUCH VERIO test strip, USE TO TEST BLOOD SUGAR 3 TIMES A DAY PRIOR TO MEALS AND RECORD, Disp: , Rfl:    pantoprazole (PROTONIX) 40 MG tablet, Take 1 tablet (40 mg total) by mouth daily., Disp: 90 tablet, Rfl: 3   rosuvastatin (CRESTOR) 20 MG tablet, Take 20 mg by mouth daily., Disp: , Rfl:    zolpidem (AMBIEN) 5 MG tablet, Take 5 mg by mouth at bedtime., Disp: , Rfl:   Current Facility-Administered Medications:    0.9 %  sodium chloride infusion, 500 mL, Intravenous, Once, Jackquline Denmark, MD  Social History   Tobacco Use  Smoking Status Current Some Day Smoker   Packs/day: 0.50   Years: 20.00   Pack years: 10.00   Types: Cigarettes  Smokeless Tobacco Never Used    No Known Allergies Objective:  There were no vitals filed for this visit. There is no height or weight on file to calculate BMI. Constitutional Well developed. Well  nourished.  Vascular Dorsalis pedis pulses non palpable bilaterally. Posterior tibial pulses non palpable bilaterally. Capillary refill normal to all digits.  No cyanosis or clubbing noted. Pedal hair growth normal.  Neurologic Normal speech. Oriented to person, place, and time. Epicritic sensation to light touch grossly present bilaterally.  Dermatologic Nails well groomed and normal in appearance. No open wounds. No skin lesions.  Orthopedic:  Right hallux distal necrosis noted dry stable in nature without any malodor, cellulitis, purulent drainage noted.  These findings are consistent with gangrene.  No underlying ulcerations noted.  No crepitus noted.  No signs of wet gangrene noted.   Radiographs: 3 views of skeletally mature adult right foot: No concern for osteomyelitis or bony destruction noted on the plain films.  No soft tissue emphysema or gas noted on the plain films. Assessment:   1. Diabetic peripheral neuropathy associated with type 2 diabetes mellitus (HCC)   2. Gangrene of toe of right foot ([place])   3. PAD (peripheral artery disease) ([place])    Plan:  Patient was evaluated and treated and all questions answered.  Right hallux gangrene actively demarcating -I explained to patient the etiology of gangrene and various treatment options were discussed.  Patient also has a component of peripheral arterial disease that is likely playing a major role in beginning changes of micro vessel disease and lowering the flow to the right hallux.  At this point from my standpoint given that is not clinically infected will I will continue to monitor and watch.  I have instructed him to do Betadine wet-to-dry dressing changes.  Do not get the foot wet in the shower. -ABIs PVRs were reviewed with the patient.  I believe patient will benefit from a vascular intervention to optimize vascular flow to the right lower extremity.  He will be scheduled to see Dr. Corrie Mckusick. -Continue wearing  surgical shoe -Continue Betadine wet-to-dry dressing changes 3 times a week. -Doxycycline was dispensed for skin and soft tissue prophylaxis. -Patient is a high risk of losing that digit versus the foot if this continues to get worse without any aggressive intervention. -I have asked him to go to the emergency room if the wound continues to get worse and or if the gangrene becomes wet.  Patient states understanding  No follow-ups on file.

## 2020-03-04 ENCOUNTER — Other Ambulatory Visit: Payer: Self-pay | Admitting: Radiology

## 2020-03-05 ENCOUNTER — Ambulatory Visit (HOSPITAL_COMMUNITY)
Admission: RE | Admit: 2020-03-05 | Discharge: 2020-03-05 | Disposition: A | Discharge status: Home / Self Care | Payer: No Typology Code available for payment source | Source: Ambulatory Visit | Attending: Interventional Radiology | Admitting: Interventional Radiology

## 2020-03-05 ENCOUNTER — Other Ambulatory Visit (HOSPITAL_COMMUNITY): Payer: Self-pay | Admitting: Interventional Radiology

## 2020-03-05 ENCOUNTER — Other Ambulatory Visit: Payer: Self-pay

## 2020-03-05 ENCOUNTER — Encounter (HOSPITAL_COMMUNITY): Payer: Self-pay

## 2020-03-05 DIAGNOSIS — I70261 Atherosclerosis of native arteries of extremities with gangrene, right leg: Secondary | ICD-10-CM | POA: Diagnosis not present

## 2020-03-05 DIAGNOSIS — I70229 Atherosclerosis of native arteries of extremities with rest pain, unspecified extremity: Secondary | ICD-10-CM

## 2020-03-05 DIAGNOSIS — E1140 Type 2 diabetes mellitus with diabetic neuropathy, unspecified: Secondary | ICD-10-CM | POA: Insufficient documentation

## 2020-03-05 DIAGNOSIS — Z8673 Personal history of transient ischemic attack (TIA), and cerebral infarction without residual deficits: Secondary | ICD-10-CM | POA: Insufficient documentation

## 2020-03-05 DIAGNOSIS — Z7982 Long term (current) use of aspirin: Secondary | ICD-10-CM | POA: Insufficient documentation

## 2020-03-05 DIAGNOSIS — F17210 Nicotine dependence, cigarettes, uncomplicated: Secondary | ICD-10-CM | POA: Diagnosis not present

## 2020-03-05 DIAGNOSIS — Z794 Long term (current) use of insulin: Secondary | ICD-10-CM | POA: Insufficient documentation

## 2020-03-05 DIAGNOSIS — Z79899 Other long term (current) drug therapy: Secondary | ICD-10-CM | POA: Diagnosis not present

## 2020-03-05 DIAGNOSIS — E1151 Type 2 diabetes mellitus with diabetic peripheral angiopathy without gangrene: Secondary | ICD-10-CM | POA: Diagnosis present

## 2020-03-05 DIAGNOSIS — I10 Essential (primary) hypertension: Secondary | ICD-10-CM | POA: Insufficient documentation

## 2020-03-05 DIAGNOSIS — Z8546 Personal history of malignant neoplasm of prostate: Secondary | ICD-10-CM | POA: Diagnosis not present

## 2020-03-05 DIAGNOSIS — S91101A Unspecified open wound of right great toe without damage to nail, initial encounter: Secondary | ICD-10-CM

## 2020-03-05 DIAGNOSIS — E785 Hyperlipidemia, unspecified: Secondary | ICD-10-CM | POA: Insufficient documentation

## 2020-03-05 HISTORY — PX: IR FEM POP ART PTA MOD SED: IMG2309

## 2020-03-05 HISTORY — PX: IR TIB-PERO ART ATHEREC INC PTA MOD SED: IMG2314

## 2020-03-05 HISTORY — PX: IR US GUIDE VASC ACCESS RIGHT: IMG2390

## 2020-03-05 HISTORY — PX: IR ANGIOGRAM EXTREMITY RIGHT: IMG652

## 2020-03-05 LAB — BASIC METABOLIC PANEL
Anion gap: 10 (ref 5–15)
BUN: 9 mg/dL (ref 8–23)
CO2: 23 mmol/L (ref 22–32)
Calcium: 9.2 mg/dL (ref 8.9–10.3)
Chloride: 108 mmol/L (ref 98–111)
Creatinine, Ser: 0.82 mg/dL (ref 0.61–1.24)
GFR calc Af Amer: >60 mL/min (ref >60)
GFR calc non Af Amer: >60 mL/min (ref >60)
Glucose, Bld: 102 mg/dL — ABNORMAL HIGH (ref 70–99)
Potassium: 4 mmol/L (ref 3.5–5.1)
Sodium: 141 mmol/L (ref 135–145)

## 2020-03-05 LAB — CBC
HCT: 40.9 % (ref 39.0–52.0)
Hemoglobin: 12.8 g/dL — ABNORMAL LOW (ref 13.0–17.0)
MCH: 28.8 pg (ref 26.0–34.0)
MCHC: 31.3 g/dL (ref 30.0–36.0)
MCV: 92.1 fL (ref 80.0–100.0)
Platelets: 255 10*3/uL (ref 150–400)
RBC: 4.44 MIL/uL (ref 4.22–5.81)
RDW: 13.5 % (ref 11.5–15.5)
WBC: 5.5 10*3/uL (ref 4.0–10.5)
nRBC: 0 % (ref 0.0–0.2)

## 2020-03-05 LAB — GLUCOSE, CAPILLARY: Glucose-Capillary: 116 mg/dL — ABNORMAL HIGH (ref 70–99)

## 2020-03-05 LAB — POCT ACTIVATED CLOTTING TIME
Activated Clotting Time: 252 seconds
Activated Clotting Time: 208 seconds

## 2020-03-05 LAB — PROTIME-INR
INR: 1 (ref 0.8–1.2)
Prothrombin Time: 12.3 seconds (ref 11.4–15.2)

## 2020-03-05 MED ORDER — CLOPIDOGREL BISULFATE 75 MG PO TABS: 300.0000 mg | ORAL_TABLET | Freq: Once | ORAL | Status: AC

## 2020-03-05 MED ORDER — ASPIRIN 325 MG PO TABS: 650.0000 mg | ORAL_TABLET | Freq: Once | ORAL | Status: DC

## 2020-03-05 MED ORDER — ASPIRIN EC 325 MG PO TBEC
DELAYED_RELEASE_TABLET | ORAL | Status: AC
  Administered 2020-03-05: 650 mg
  Filled 2020-03-05: qty 2

## 2020-03-05 MED ORDER — LIDOCAINE HCL 1 % IJ SOLN
INTRAMUSCULAR | Status: AC
  Filled 2020-03-05: qty 20

## 2020-03-05 MED ORDER — HEPARIN SODIUM (PORCINE) 1000 UNIT/ML IJ SOLN
INTRAMUSCULAR | Status: AC
  Filled 2020-03-05: qty 1

## 2020-03-05 MED ORDER — IODIXANOL 320 MG/ML IV SOLN
100.0000 mL | Freq: Once | INTRAVENOUS | Status: AC | PRN
  Administered 2020-03-05: 60 mL via INTRA_ARTERIAL

## 2020-03-05 MED ORDER — MIDAZOLAM HCL 2 MG/2ML IJ SOLN
INTRAMUSCULAR | Status: AC | PRN
  Administered 2020-03-05 (×2): 0.5 mg via INTRAVENOUS
  Administered 2020-03-05: 1 mg via INTRAVENOUS

## 2020-03-05 MED ORDER — HEPARIN SODIUM (PORCINE) 1000 UNIT/ML IJ SOLN
INTRAMUSCULAR | Status: AC | PRN
  Administered 2020-03-05: 10000 [IU] via INTRAVENOUS

## 2020-03-05 MED ORDER — NITROGLYCERIN 1 MG/10 ML FOR IR/CATH LAB
INTRA_ARTERIAL | Status: AC | PRN
  Administered 2020-03-05: 200 ug via INTRA_ARTERIAL

## 2020-03-05 MED ORDER — IODIXANOL 320 MG/ML IV SOLN
100.0000 mL | Freq: Once | INTRAVENOUS | Status: AC | PRN
  Administered 2020-03-05: 30 mL via INTRA_ARTERIAL

## 2020-03-05 MED ORDER — FENTANYL CITRATE (PF) 100 MCG/2ML IJ SOLN
INTRAMUSCULAR | Status: AC | PRN
  Administered 2020-03-05: 25 ug via INTRAVENOUS
  Administered 2020-03-05: 50 ug via INTRAVENOUS
  Administered 2020-03-05: 25 ug via INTRAVENOUS

## 2020-03-05 MED ORDER — PROTAMINE SULFATE 10 MG/ML IV SOLN
50.0000 mg | Freq: Once | INTRAVENOUS | Status: DC
  Filled 2020-03-05: qty 5

## 2020-03-05 MED ORDER — NITROGLYCERIN 1 MG/10 ML FOR IR/CATH LAB
INTRA_ARTERIAL | Status: AC
  Filled 2020-03-05: qty 10

## 2020-03-05 MED ORDER — CLOPIDOGREL BISULFATE 300 MG PO TABS
ORAL_TABLET | ORAL | Status: AC
  Administered 2020-03-05: 300 mg via ORAL
  Filled 2020-03-05: qty 1

## 2020-03-05 MED ORDER — MIDAZOLAM HCL 2 MG/2ML IJ SOLN
INTRAMUSCULAR | Status: AC
  Filled 2020-03-05: qty 2

## 2020-03-05 MED ORDER — FENTANYL CITRATE (PF) 100 MCG/2ML IJ SOLN
INTRAMUSCULAR | Status: AC
  Filled 2020-03-05: qty 2

## 2020-03-05 MED ORDER — CLOPIDOGREL BISULFATE 75 MG PO TABS
75.0000 mg | ORAL_TABLET | Freq: Every day | ORAL | 2 refills | Status: DC
Start: 2020-03-05 — End: 2021-10-26

## 2020-03-05 MED ORDER — SODIUM CHLORIDE 0.9 % IV SOLN: INTRAVENOUS | Status: DC

## 2020-03-05 NOTE — H&P (Signed)
Chief Complaint: Right toe gangrene  Referring Physician(s): Gorham P  Supervising Physician: Corrie Mckusick  Patient Status: [facility] - Out-pt  History of Present Illness: George Franco is a 64 y.o. male who was seen via telephone visit by Dr. Earleen Newport on 02/26/20 for right toe gangrene.  Per chart, the  toe started turning dark and the nail was lifting off.    It has been going on for about 3-4 weeks now has progressive gotten worse.    He denies any trauma to the toe.   He has been treating it with soap and warm water and an antibiotic ointment.    He states it hurts only to touch.    Right ABI: 0.94 Left ABI: 0.77 Duplex describes monophasic/occluded right tibial arteries.  He is here today for arteriogram with possible angioplasty and stent to improve blood flow to the foot in hopes of healing a toe amputation site.  He is NPO.   Past Medical History:  Diagnosis Date  . At risk for sleep apnea    STOP-BANG= 5     SENT TO PCP 06-02-2015  . BPH (benign prostatic hyperplasia)   . CTS (carpal tunnel syndrome)   . CTS (carpal tunnel syndrome)   . ED (erectile dysfunction) of organic origin   . Hyperlipidemia   . Hypertension   . Hypogonadism in male   . Knee pain    pt said he doesn't know anything about this  . Lower urinary tract symptoms (LUTS)   . LVH (left ventricular hypertrophy)   . Persistent vertigo of central origin   . Prostate cancer [facility]) urologist-  dr ottelin/  oncologist-  [name]   Stage T1c, Gleason 4+3, PSA 4.48,  vol 32.9cc--  External RXT complete 05-08-2015  . Stroke ([place])   . Type 2 diabetes mellitus ([place])   . Vestibular neuropathy   . Wears glasses     Past Surgical History:  Procedure Laterality Date  . COLONOSCOPY    . IR RADIOLOGIST EVAL & MGMT  02/26/2020  . PROSTATE BIOPSY    . RADIOACTIVE SEED IMPLANT N/A 06/05/2015   Procedure: RADIOACTIVE SEED IMPLANT/BRACHYTHERAPY IMPLANT;  Surgeon: [name], MD;  Location: [place];  Service: Urology;  Laterality: N/A;  . ROTATOR CUFF REPAIR Right 2014  . UMBILICAL HERNIA REPAIR  1990's  . UPPER GASTROINTESTINAL ENDOSCOPY    . wisdom teeth extrat      Allergies: Patient has no known allergies.  Medications: Prior to Admission medications   Medication Sig Start Date End Date Taking? Authorizing Provider  aspirin EC 81 MG tablet Take 1 tablet (81 mg total) by mouth daily. 06/05/19   Melvenia Beam, MD  benazepril (LOTENSIN) 40 MG tablet Take 40 mg by mouth daily.    [provider]  doxycycline (VIBRA-TABS) 100 MG tablet Take 100 mg by mouth 2 (two) times daily. 08/21/19   [provider]  doxycycline (VIBRA-TABS) 100 MG tablet Take 1 tablet (100 mg total) by mouth 2 (two) times daily. 02/14/20   Felipa Furnace, DPM  ezetimibe (ZETIA) 10 MG tablet Take 10 mg by mouth daily. 07/13/19   [provider]  FARXIGA 10 MG TABS tablet Take 10 mg by mouth daily. 12/29/17   [provider]  Insulin Degludec (TRESIBA) 100 UNIT/ML SOLN Inject 20 Units into the skin daily.    [provider]  loperamide (IMODIUM) 2 MG capsule Take 2 mg by mouth every 6 (six)  hours as needed for constipation. 01/08/18   [provider]  meclizine (ANTIVERT) 25 MG tablet Take 1 tablet (25 mg total) by mouth 3 (three) times daily as needed for dizziness. 05/13/19   Melvenia Beam, MD  metoCLOPramide (REGLAN) 10 MG tablet Take 1 tablet (10 mg total) by mouth every 6 (six) hours. 01/17/18   Langston Masker B, PA-C  mupirocin ointment (BACTROBAN) 2 % APPLY TO TOE TWICE DAILY 08/21/19   [provider]  NOVOLOG FLEXPEN 100 UNIT/ML FlexPen Inject 20 Units into the skin 3 (three) times daily. 12/12/17   [provider]  ondansetron (ZOFRAN-ODT) 4 MG disintegrating tablet Take 4 mg by mouth every 4 (four) hours as needed for nausea/vomiting. 01/08/18   [provider]  ONETOUCH VERIO test strip USE TO TEST BLOOD SUGAR 3  TIMES A DAY PRIOR TO MEALS AND RECORD 03/22/19   [provider]  pantoprazole (PROTONIX) 40 MG tablet Take 1 tablet (40 mg total) by mouth daily. 01/29/18   Jackquline Denmark, MD  rosuvastatin (CRESTOR) 20 MG tablet Take 20 mg by mouth daily. 07/19/19   [provider]  zolpidem (AMBIEN) 5 MG tablet Take 5 mg by mouth at bedtime. 07/14/19   [provider]     Family History  Problem Relation Age of Onset  . Prostate cancer Father   . Diabetes Father   . Diabetes Mother   . Diabetes Brother   . Colon cancer Neg Hx   . Esophageal cancer Neg Hx   . Liver cancer Neg Hx   . Pancreatic cancer Neg Hx   . Rectal cancer Neg Hx   . Stomach cancer Neg Hx     Social History   Socioeconomic History  . Marital status: Married    Spouse name: Not on file  . Number of children: 3  . Years of education: Not on file  . Highest education level: Not on file  Occupational History  . Occupation: Glass blower/designer    Employer: [employer]  Tobacco Use  . Smoking status: Current Some Day Smoker    Packs/day: 0.50    Years: 20.00    Pack years: 10.00    Types: Cigarettes  . Smokeless tobacco: Never Used  Vaping Use  . Vaping Use: Never used  Substance and Sexual Activity  . Alcohol use: Yes    Alcohol/week: 0.0 standard drinks    Comment: occasional  . Drug use: No  . Sexual activity: Yes    Partners: Female  Other Topics Concern  . 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 Difficulty of Paying Living Expenses:   Food Insecurity:   . Worried About Charity fundraiser in the Last Year:   . Arboriculturist in the Last Year:   Transportation Needs:   . Film/video editor (Medical):   Marland Kitchen Lack of Transportation  (Non-Medical):   Physical Activity:   . Days of Exercise per Week:   . Minutes of Exercise per Session:   Stress:   . Feeling of Stress :   Social Connections:   . Frequency of Communication with Friends and Family:   .  Frequency of Social Gatherings with Friends and Family:   . Attends Religious Services:   . Active Member of Clubs or Organizations:   . Attends Archivist Meetings:   Marland Kitchen Marital Status:      Review of Systems: A 12 point ROS discussed and pertinent positives are indicated in the HPI above.  All other systems are negative.  Review of Systems  Vital Signs: BP (!) 152/76   Pulse 69   Temp 98 F (36.7 C) (Oral)   Ht 5\' 9"  (1.753 m)   Wt (!) 93 kg   SpO2 99%   BMI 30.27 kg/m   Physical Exam Vitals reviewed.  Constitutional:      Appearance: Normal appearance.  HENT:     Head: Normocephalic and atraumatic.  Eyes:     Extraocular Movements: Extraocular movements intact.  Cardiovascular:     Rate and Rhythm: Normal rate and regular rhythm.     Comments: Palpable right femoral pulse. Monophasic doppler signal Right PTA and DPA. Pulmonary:     Effort: Pulmonary effort is normal. No respiratory distress.     Breath sounds: Normal breath sounds.  Abdominal:     General: There is no distension.     Palpations: Abdomen is soft.     Tenderness: There is no abdominal tenderness.  Musculoskeletal:        General: Normal range of motion.     Cervical back: Normal range of motion.  Skin:    General: Skin is warm and dry.  Neurological:     General: No focal deficit present.     Mental Status: He is alert and oriented to person, place, and time.  Psychiatric:        Mood and Affect: Mood normal.        Behavior: Behavior normal.        Thought Content: Thought content normal.        Judgment: Judgment normal.         Imaging: DG Foot Complete Right  Result Date: 02/14/2020 Please see detailed radiograph report in office note.  VAS [place] ABI  WITH/WO TBI  Result Date: 02/21/2020 LOWER EXTREMITY DOPPLER STUDY Indications: History of PAD with gangrene to the right great toe. Patient denies              any claudication symptoms or rest pain. High Risk Factors: Hypertension, Diabetes, current smoker.  Comparison Study: In 09/2019, an arterial Doppler showed an ABI of .81 on the                   right and .91 on the left. Performing Technologist: Sharlett Iles RVT  Examination Guidelines: A complete evaluation includes at minimum, Doppler waveform signals and systolic blood pressure reading at the level of bilateral brachial, anterior tibial, and posterior tibial arteries, when vessel segments are accessible. Bilateral testing is considered an integral part of a complete examination. Photoelectric Plethysmograph (PPG) waveforms and toe systolic pressure readings are included as required and additional duplex testing as needed. Limited examinations for reoccurring indications may be performed as noted.  ABI Findings: +---------+------------------+-----+------------------+------------------------+ Right    Rt Pressure (mmHg)IndexWaveform          Comment                  +---------+------------------+-----+------------------+------------------------+ Brachial 151                                                               +---------+------------------+-----+------------------+------------------------+  ATA      148               0.91 monophasic                                 +---------+------------------+-----+------------------+------------------------+ PTA      135               0.83 dampened                                                                   monophasic                                 +---------+------------------+-----+------------------+------------------------+ PERO     152               0.94 biphasic                                    +---------+------------------+-----+------------------+------------------------+ Great Toe122               0.75 Abnormal          Mildly abnormal waveform +---------+------------------+-----+------------------+------------------------+ +---------+-----------------+-----+------------------+-------------------------+ Left     Lt Pressure      IndexWaveform          Comment                            (mmHg)                                                            +---------+-----------------+-----+------------------+-------------------------+ Brachial 162                                                               +---------+-----------------+-----+------------------+-------------------------+ ATA      124              0.77 monophasic                                  +---------+-----------------+-----+------------------+-------------------------+ PTA      125              0.77 dampened                                                                   monophasic                                  +---------+-----------------+-----+------------------+-------------------------+  PERO     121              0.75 biphasic                                    +---------+-----------------+-----+------------------+-------------------------+ Great Toe108              0.67 Abnormal          Severely abnormal                                                          waveform                  +---------+-----------------+-----+------------------+-------------------------+ +-------+-----------+-----------+------------+------------+ ABI/TBIToday's ABIToday's TBIPrevious ABIPrevious TBI +-------+-----------+-----------+------------+------------+ Right  .94        .75        .81         .34          +-------+-----------+-----------+------------+------------+ Left   .77        .67        .91         .33           +-------+-----------+-----------+------------+------------+ Right ABIs appear essentially unchanged compared to prior study on 09/30/2019. Left ABIs appear decreased compared to prior study on 09/30/2019. Bilateral TBIs appear increased compared to prior study. The right TBI has increased by .41 and the left TBI has increased by .34.  Summary: Right: Resting right ankle-brachial index indicates mild right lower extremity arterial disease. The right toe-brachial index is normal. Left: Resting left ankle-brachial index indicates moderate left lower extremity arterial disease. The left toe-brachial index is abnormal.  *See table(s) above for measurements and observations. See LE Arterial duplex report. Vascular consult recommended. Electronically signed by Carlyle Dolly MD on 02/21/2020 at 1:33:34 PM.    Final    VAS [place] LOWER EXTREMITY ARTERIAL DUPLEX  Result Date: 02/22/2020 LOWER EXTREMITY ARTERIAL DUPLEX STUDY Indications: History of PAD with gangrene to the right great toe. Patient denies              any claudication symptoms or rest pain. High Risk Factors: Hypertension, Diabetes, current smoker.  Current ABI: .94 on the right and .77 on the left Comparison Study: In 09/2019, a lower arterial duplex showed 30-49% stenosis in                   the right distal SFA, high end range. Occluded right distal                   PTA and right proximal and mid peroneal artery. Occluded left                   distal PTA. Performing Technologist: Sharlett Iles RVT  Examination Guidelines: A complete evaluation includes B-mode imaging, spectral Doppler, color Doppler, and power Doppler as needed of all accessible portions of each vessel. Bilateral testing is considered an integral part of a complete examination. Limited examinations for reoccurring indications may be performed as noted.  +-----------+--------+-----+--------+----------+-------------------------------+ LEFT       PSV cm/sRatioStenosisWaveform   Comments                        +-----------+--------+-----+--------+----------+-------------------------------+  CFA Prox   120                  biphasic                                  +-----------+--------+-----+--------+----------+-------------------------------+ DFA        113                  biphasic                                  +-----------+--------+-----+--------+----------+-------------------------------+ SFA Prox   89                   biphasic                                  +-----------+--------+-----+--------+----------+-------------------------------+ SFA Mid    121                  biphasic                                  +-----------+--------+-----+--------+----------+-------------------------------+ SFA Distal 85                   biphasic                                  +-----------+--------+-----+--------+----------+-------------------------------+ POP Prox   136                  biphasic                                  +-----------+--------+-----+--------+----------+-------------------------------+ POP Mid    104                  biphasic                                  +-----------+--------+-----+--------+----------+-------------------------------+ POP Distal 58                   biphasic                                  +-----------+--------+-----+--------+----------+-------------------------------+ TP Trunk   64                   biphasic                                  +-----------+--------+-----+--------+----------+-------------------------------+ ATA Prox   26                   biphasic  tortuous                        +-----------+--------+-----+--------+----------+-------------------------------+ ATA Mid    0            occluded          mid/distal segment now shows  occlusion compared to the prior                                           exam                             +-----------+--------+-----+--------+----------+-------------------------------+ ATA Distal 14                   monophasic                                +-----------+--------+-----+--------+----------+-------------------------------+ PTA Prox   100                  biphasic                                  +-----------+--------+-----+--------+----------+-------------------------------+ PTA Mid    0            occluded          mid segment now shows occlusion                                           compared to the prior exam      +-----------+--------+-----+--------+----------+-------------------------------+ PTA Distal 0            occluded                                          +-----------+--------+-----+--------+----------+-------------------------------+ PERO Prox  0            occluded          proximal segment now shows                                                occlusion compared to the prior                                           exam                            +-----------+--------+-----+--------+----------+-------------------------------+ PERO Mid   40                   monophasic                                +-----------+--------+-----+--------+----------+-------------------------------+ PERO Distal35                   monophasic                                +-----------+--------+-----+--------+----------+-------------------------------+  Summary: Left: Mixed plaque throughout. No evidence of  stenosis or occlusion in the CFA, DFA, SFA, popliteal artery and TPT. Tibial vessel disease; there now appears to be zero vessel run-off.  See table(s) above for measurements and observations. See ABI report. Vascular consult recommended. Electronically signed by Carlyle Dolly MD on 02/22/2020 at 4:58:00 PM.    Final    IR Radiologist Eval & Mgmt  Result Date: 02/26/2020 Please refer to notes tab  for details about interventional procedure. (Op Note)   Labs:  CBC: No results for input(s): WBC, HGB, HCT, PLT in the last 8760 hours.  COAGS: No results for input(s): INR, APTT in the last 8760 hours.  BMP: No results for input(s): NA, K, CL, CO2, GLUCOSE, BUN, CALCIUM, CREATININE, GFRNONAA, GFRAA in the last 8760 hours.  Invalid input(s): CMP  LIVER FUNCTION TESTS: No results for input(s): BILITOT, AST, ALT, ALKPHOS, PROT, ALBUMIN in the last 8760 hours.  TUMOR MARKERS: No results for input(s): AFPTM, CEA, CA199, CHROMGRNA in the last 8760 hours.  Assessment and Plan:  Peripheral vascular disease with gangrene of the right great toe.  Will proceed with Right leg arteriography with possible angioplasty and stent by Dr.  Earleen Newport.  Risks and benefits of right leg angiography and angioplasty were discussed with the patient including, but not limited to bleeding, infection, vascular injury or contrast induced renal failure.  This interventional procedure involves the use of X-rays and because of the nature of the planned procedure, it is possible that we will have prolonged use of X-ray fluoroscopy.  Potential radiation risks to you include (but are not limited to) the following: - A slightly elevated risk for cancer  several years later in life. This risk is typically less than 0.5% percent. This risk is low in comparison to the normal incidence of human cancer, which is 33% for women and 50% for men according to the [employer]. - Radiation induced injury can include skin redness, resembling a rash, tissue breakdown / ulcers and hair loss (which can be temporary or permanent).   The likelihood of either of these occurring depends on the difficulty of the procedure and whether you are sensitive to radiation due to previous procedures, disease, or genetic conditions.   IF your procedure requires a prolonged use of radiation, you will be notified and given written  instructions for further action.  It is your responsibility to monitor the irradiated area for the 2 weeks following the procedure and to notify your physician if you are concerned that you have suffered a radiation induced injury.    All of the patient's questions were answered, patient is agreeable to proceed.  Consent signed and in chart.  Thank you for this interesting consult.  I greatly enjoyed meeting Kaidan Harpster and look forward to participating in their care.  A copy of this report was sent to the requesting provider on this date.  Electronically Signed: Murrell Redden, PA-C   03/05/2020, 12:48 PM      I spent a total of   25 Minutes in face to face in clinical consultation, greater than 50% of which was counseling/coordinating care for right leg arteriogram with angioplasty.

## 2020-03-05 NOTE — Progress Notes (Signed)
Prescription for Plavix sent to patient's preferred pharmacy of [facility] [address]  Plavix 75mg  daily #30, 2 refills  Brynda Greathouse, MS RD PA-C

## 2020-03-05 NOTE — Sedation Documentation (Signed)
ACT 252

## 2020-03-05 NOTE — Discharge Instructions (Signed)
Femoral Site Care This sheet gives you information about how to care for yourself after your procedure. Your health care provider may also give you more specific instructions. If you have problems or questions, contact your health care provider. What can I expect after the procedure? After the procedure, it is common to have:  Bruising that usually fades within 1-2 weeks.  Tenderness at the site. Follow these instructions at home: Wound care  Follow instructions from your health care provider about how to take care of your insertion site. Make sure you: ? Wash your hands with soap and water before you change your bandage (dressing). If soap and water are not available, use hand sanitizer. ? Change your dressing as told by your health care provider. ? Leave stitches (sutures), skin glue, or adhesive strips in place. These skin closures may need to stay in place for 2 weeks or longer. If adhesive strip edges start to loosen and curl up, you may trim the loose edges. Do not remove adhesive strips completely unless your health care provider tells you to do that.  Do not take baths, swim, or use a hot tub until your health care provider approves.  You may shower 24-48 hours after the procedure or as told by your health care provider. ? Gently wash the site with plain soap and water. ? Pat the area dry with a clean towel. ? Do not rub the site. This may cause bleeding.  Do not apply powder or lotion to the site. Keep the site clean and dry.  Check your femoral site every day for signs of infection. Check for: ? Redness, swelling, or pain. ? Fluid or blood. ? Warmth. ? Pus or a bad smell. Activity  For the first 2-3 days after your procedure, or as long as directed: ? Avoid climbing stairs as much as possible. ? Do not squat.  Do not lift anything that is heavier than 10 lb (4.5 kg), or the limit that you are told, until your health care provider says that it is safe.  Rest as  directed. ? Avoid sitting for a long time without moving. Get up to take short walks every 1-2 hours.  Do not drive for 24 hours if you were given a medicine to help you relax (sedative). General instructions  Take over-the-counter and prescription medicines only as told by your health care provider.  Keep all follow-up visits as told by your health care provider. This is important. Contact a health care provider if you have:  A fever or chills.  You have redness, swelling, or pain around your insertion site. Get help right away if:  The catheter insertion area swells very fast.  You pass out.  You suddenly start to sweat or your skin gets clammy.  The catheter insertion area is bleeding, and the bleeding does not stop when you hold steady pressure on the area.  The area near or just beyond the catheter insertion site becomes pale, cool, tingly, or numb. These symptoms may represent a serious problem that is an emergency. Do not wait to see if the symptoms will go away. Get medical help right away. Call your local emergency services (911 in the [place] Do not drive yourself to the hospital. Summary  After the procedure, it is common to have bruising that usually fades within 1-2 weeks.  Check your femoral site every day for signs of infection.  Do not lift anything that is heavier than 10 lb (4.5 kg), or the   limit that you are told, until your health care provider says that it is safe. This information is not intended to replace advice given to you by your health care provider. Make sure you discuss any questions you have with your health care provider. Document Revised: 08/07/2017 Document Reviewed: 08/07/2017 Elsevier Patient Education  2020 [employer]  

## 2020-03-05 NOTE — Procedures (Signed)
Interventional Radiology Procedure Note  Procedure:    US guided right CFA access Right LE angiogram Treatment of occluded peroneal artery, with restoration of flow to the ankle, with directional atherectomy and DEB of the TP trunk/peroneal artery Treatment of diseased SFA with DEB  Angioseal deployed for hemostasis. .  Complications: None  Recommendations:  - Right hip straight x 3 hours.   - Advance diet  - DC home when goals met in 3 hours - Do not submerge for 7 days - DAPT, initiating plavix 5m daily, for 3 months - Follow up with wound care, on schedule - Follow up with Dr. WEarleen Newportin 3-4 weeks    Signed,  JDulcy Fanny WEarleen Newport DO

## 2020-03-13 ENCOUNTER — Other Ambulatory Visit: Payer: Self-pay | Admitting: Interventional Radiology

## 2020-03-13 DIAGNOSIS — I96 Gangrene, not elsewhere classified: Secondary | ICD-10-CM

## 2020-03-20 ENCOUNTER — Ambulatory Visit (INDEPENDENT_AMBULATORY_CARE_PROVIDER_SITE_OTHER): Payer: No Typology Code available for payment source

## 2020-03-20 ENCOUNTER — Other Ambulatory Visit: Payer: Self-pay

## 2020-03-20 ENCOUNTER — Ambulatory Visit (INDEPENDENT_AMBULATORY_CARE_PROVIDER_SITE_OTHER): Payer: No Typology Code available for payment source | Admitting: Podiatry

## 2020-03-20 DIAGNOSIS — I96 Gangrene, not elsewhere classified: Secondary | ICD-10-CM

## 2020-03-20 DIAGNOSIS — L603 Nail dystrophy: Secondary | ICD-10-CM

## 2020-03-20 DIAGNOSIS — E1142 Type 2 diabetes mellitus with diabetic polyneuropathy: Secondary | ICD-10-CM | POA: Diagnosis not present

## 2020-03-20 DIAGNOSIS — I739 Peripheral vascular disease, unspecified: Secondary | ICD-10-CM | POA: Diagnosis not present

## 2020-03-24 ENCOUNTER — Encounter: Payer: Self-pay | Admitting: Podiatry

## 2020-03-24 NOTE — Progress Notes (Signed)
Subjective:  Patient ID: George Franco, male    DOB: 16-Nov-1955,  MRN: [id number]  Chief Complaint  Patient presents with  . Wound Check    pt is here for a wound check of the left big toe    64 y.o. male presents with the above complaint.  Patient is following up with gangrenous changes to the right hallux distal tip.  It is still actively demarcating.  Patient has been doing Betadine wet-to-dry dressing changes.  Patient had angiogram done by with Corrie Mckusick.  He denies any other acute complaints.   Review of Systems: Negative except as noted in the HPI. Denies N/V/F/Ch.  Past Medical History:  Diagnosis Date  . At risk for sleep apnea    STOP-BANG= 5     SENT TO PCP 06-02-2015  . BPH (benign prostatic hyperplasia)   . CTS (carpal tunnel syndrome)   . CTS (carpal tunnel syndrome)   . ED (erectile dysfunction) of organic origin   . Hyperlipidemia   . Hypertension   . Hypogonadism in male   . Knee pain    pt said he doesn't know anything about this  . Lower urinary tract symptoms (LUTS)   . LVH (left ventricular hypertrophy)   . Persistent vertigo of central origin   . Prostate cancer [facility]) urologist-  dr ottelin/  oncologist-  [name]   Stage T1c, Gleason 4+3, PSA 4.48,  vol 32.9cc--  External RXT complete 05-08-2015  . Stroke ([place])   . Type 2 diabetes mellitus ([place])   . Vestibular neuropathy   . Wears glasses     Current Outpatient Medications:  .  aspirin EC 81 MG tablet, Take 1 tablet (81 mg total) by mouth daily., Disp: 30 tablet, Rfl: 0 .  benazepril (LOTENSIN) 40 MG tablet, Take 40 mg by mouth daily., Disp: , Rfl:  .  clopidogrel (PLAVIX) 75 MG tablet, Take 1 tablet (75 mg total) by mouth daily., Disp: 30 tablet, Rfl: 2 .  doxycycline (VIBRA-TABS) 100 MG tablet, Take 100 mg by mouth 2 (two) times daily., Disp: , Rfl:  .  doxycycline (VIBRA-TABS) 100 MG tablet, Take 1 tablet (100 mg total) by mouth 2 (two) times daily., Disp: 28 tablet, Rfl: 0 .  ezetimibe  (ZETIA) 10 MG tablet, Take 10 mg by mouth daily., Disp: , Rfl:  .  FARXIGA 10 MG TABS tablet, Take 10 mg by mouth daily., Disp: , Rfl:  .  Insulin Degludec (TRESIBA) 100 UNIT/ML SOLN, Inject 20 Units into the skin daily., Disp: , Rfl:  .  loperamide (IMODIUM) 2 MG capsule, Take 2 mg by mouth every 6 (six) hours as needed for constipation., Disp: , Rfl: 0 .  meclizine (ANTIVERT) 25 MG tablet, Take 1 tablet (25 mg total) by mouth 3 (three) times daily as needed for dizziness., Disp: 30 tablet, Rfl: 2 .  metoCLOPramide (REGLAN) 10 MG tablet, Take 1 tablet (10 mg total) by mouth every 6 (six) hours., Disp: 30 tablet, Rfl: 0 .  mupirocin ointment (BACTROBAN) 2 %, APPLY TO TOE TWICE DAILY, Disp: , Rfl:  .  NOVOLOG FLEXPEN 100 UNIT/ML FlexPen, Inject 20 Units into the skin 3 (three) times daily., Disp: , Rfl: 6 .  ondansetron (ZOFRAN-ODT) 4 MG disintegrating tablet, Take 4 mg by mouth every 4 (four) hours as needed for nausea/vomiting., Disp: , Rfl: 0 .  ONETOUCH VERIO test strip, USE TO TEST BLOOD SUGAR 3 TIMES A DAY PRIOR TO MEALS AND RECORD, Disp: , Rfl:  .  pantoprazole (PROTONIX) 40 MG tablet, Take 1 tablet (40 mg total) by mouth daily., Disp: 90 tablet, Rfl: 3 .  rosuvastatin (CRESTOR) 20 MG tablet, Take 20 mg by mouth daily., Disp: , Rfl:  .  zolpidem (AMBIEN) 5 MG tablet, Take 5 mg by mouth at bedtime., Disp: , Rfl:   Current Facility-Administered Medications:  .  0.9 %  sodium chloride infusion, 500 mL, Intravenous, Once, Jackquline Denmark, MD  Social History   Tobacco Use  Smoking Status Current Some Day Smoker  . Packs/day: 0.50  . Years: 20.00  . Pack years: 10.00  . Types: Cigarettes  Smokeless Tobacco Never Used    No Known Allergies Objective:  There were no vitals filed for this visit. There is no height or weight on file to calculate BMI. Constitutional Well developed. Well nourished.  Vascular Dorsalis pedis pulses non palpable bilaterally. Posterior tibial pulses non  palpable bilaterally. Capillary refill normal to all digits.  No cyanosis or clubbing noted. Pedal hair growth normal.  Neurologic Normal speech. Oriented to person, place, and time. Epicritic sensation to light touch grossly present bilaterally.  Dermatologic Nails well groomed and normal in appearance. No open wounds. No skin lesions.  Orthopedic:  Right hallux distal necrosis noted dry stable in nature without any malodor, cellulitis, purulent drainage noted.  These findings are consistent with gangrene.  No underlying ulcerations noted.  No crepitus noted.  No signs of wet gangrene noted.  Right hallux nail is avulsing off with possible infection underneath it.   Radiographs: 3 views of skeletally mature adult right foot: No concern for osteomyelitis or bony destruction noted on the plain films.  No soft tissue emphysema or gas noted on the plain films. Assessment:   1. Diabetic peripheral neuropathy associated with type 2 diabetes mellitus (HCC)   2. Gangrene of toe of right foot ([place])    Plan:  Patient was evaluated and treated and all questions answered.  Right hallux gangrene actively demarcating -I explained to patient the etiology of gangrene and various treatment options were discussed.  Patient also has a component of peripheral arterial disease that is likely playing a major role in beginning changes of micro vessel disease and lowering the flow to the right hallux.  At this point from my standpoint given that is not clinically infected will I will continue to monitor and watch.  I have instructed him to do Betadine wet-to-dry dressing changes.  Do not get the foot wet in the shower. -He had a vascular/angiogram performed recently which showedRight LE angiogram Treatment of occluded peroneal artery, with restoration of flow to the ankle, with directional atherectomy and DEB of the TP trunk/peroneal artery Treatment of diseased SFA with DEB -Continue wearing surgical  shoe -Continue Betadine wet-to-dry dressing changes 3 times a week. -Doxycycline was dispensed for skin and soft tissue prophylaxis. -Patient is a high risk of losing that digit versus the foot if this continues to get worse without any aggressive intervention.  I will continue to monitor/do local wound care for next couple weeks if there is no regression of the digit I will plan on amputation.  Patient states understanding -I have asked him to go to the emergency room if the wound continues to get worse and or if the gangrene becomes wet.  Patient states understanding -He also had nail that is avulsing off and given that patient has gangrene of the digit I am worried that there might be infection underneath present.  I will perform a  total nail avulsion as described below to evaluate.   Nail contusion/dystrophy right hallux, right -Patient elects to proceed with minor surgery to remove entire toenail today. Consent reviewed and signed by patient. -Entire/total nail excised. See procedure note. -No underlying infection noted.  Still actively demarcating from gangrene.  Procedure: Excision of entire/total nail  Location: Right 1st toe digit Anesthesia: Lidocaine 1% plain; 1.5 mL and Marcaine 0.5% plain; 1.5 mL, digital block. Skin Prep: Betadine. Dressing: Silvadene; telfa; dry, sterile, compression dressing. Technique: Following skin prep, the toe was exsanguinated and a tourniquet was secured at the base of the toe. The affected nail border was freed and excised. The tourniquet was then removed and sterile dressing applied. Disposition: Patient tolerated procedure well. Patient to return in 2 weeks for follow-up.   No follow-ups on file.   No follow-ups on file.

## 2020-03-27 ENCOUNTER — Ambulatory Visit (INDEPENDENT_AMBULATORY_CARE_PROVIDER_SITE_OTHER): Payer: No Typology Code available for payment source | Admitting: Podiatry

## 2020-03-27 ENCOUNTER — Other Ambulatory Visit: Payer: Self-pay

## 2020-03-27 DIAGNOSIS — I739 Peripheral vascular disease, unspecified: Secondary | ICD-10-CM | POA: Diagnosis not present

## 2020-03-27 DIAGNOSIS — E1142 Type 2 diabetes mellitus with diabetic polyneuropathy: Secondary | ICD-10-CM | POA: Diagnosis not present

## 2020-03-27 DIAGNOSIS — I96 Gangrene, not elsewhere classified: Secondary | ICD-10-CM | POA: Diagnosis not present

## 2020-03-31 ENCOUNTER — Telehealth: Payer: No Typology Code available for payment source

## 2020-03-31 ENCOUNTER — Encounter: Payer: Self-pay | Admitting: Podiatry

## 2020-03-31 NOTE — Progress Notes (Signed)
Subjective:  Patient ID: George Franco, male    DOB: [birth year],  MRN: [id number]  Chief Complaint  Patient presents with  . Diabetic Ulcer    wound check    64 y.o. male presents with the above complaint.  Patient is following up with gangrenous changes to the right hallux distal tip.  It is still demarcating however it is improving considerably.  Patient has been doing Betadine wet-to-dry dressing changes.  He denies any other acute complaints.  He is here for wound check to make sure that there is no acute infection.   Review of Systems: Negative except as noted in the HPI. Denies N/V/F/Ch.  Past Medical History:  Diagnosis Date  . At risk for sleep apnea    STOP-BANG= 5     SENT TO PCP 06-02-2015  . BPH (benign prostatic hyperplasia)   . CTS (carpal tunnel syndrome)   . CTS (carpal tunnel syndrome)   . ED (erectile dysfunction) of organic origin   . Hyperlipidemia   . Hypertension   . Hypogonadism in male   . Knee pain    pt said he doesn't know anything about this  . Lower urinary tract symptoms (LUTS)   . LVH (left ventricular hypertrophy)   . Persistent vertigo of central origin   . Prostate cancer [facility]) urologist-  dr ottelin/  oncologist-  [name]   Stage T1c, Gleason 4+3, PSA 4.48,  vol 32.9cc--  External RXT complete 05-08-2015  . Stroke ([place])   . Type 2 diabetes mellitus ([place])   . Vestibular neuropathy   . Wears glasses     Current Outpatient Medications:  .  aspirin EC 81 MG tablet, Take 1 tablet (81 mg total) by mouth daily., Disp: 30 tablet, Rfl: 0 .  benazepril (LOTENSIN) 40 MG tablet, Take 40 mg by mouth daily., Disp: , Rfl:  .  clopidogrel (PLAVIX) 75 MG tablet, Take 1 tablet (75 mg total) by mouth daily., Disp: 30 tablet, Rfl: 2 .  doxycycline (VIBRA-TABS) 100 MG tablet, Take 100 mg by mouth 2 (two) times daily., Disp: , Rfl:  .  doxycycline (VIBRA-TABS) 100 MG tablet, Take 1 tablet (100 mg total) by mouth 2 (two) times daily., Disp: 28 tablet, Rfl:  0 .  ezetimibe (ZETIA) 10 MG tablet, Take 10 mg by mouth daily., Disp: , Rfl:  .  FARXIGA 10 MG TABS tablet, Take 10 mg by mouth daily., Disp: , Rfl:  .  Insulin Degludec (TRESIBA) 100 UNIT/ML SOLN, Inject 20 Units into the skin daily., Disp: , Rfl:  .  loperamide (IMODIUM) 2 MG capsule, Take 2 mg by mouth every 6 (six) hours as needed for constipation., Disp: , Rfl: 0 .  meclizine (ANTIVERT) 25 MG tablet, Take 1 tablet (25 mg total) by mouth 3 (three) times daily as needed for dizziness., Disp: 30 tablet, Rfl: 2 .  metoCLOPramide (REGLAN) 10 MG tablet, Take 1 tablet (10 mg total) by mouth every 6 (six) hours., Disp: 30 tablet, Rfl: 0 .  mupirocin ointment (BACTROBAN) 2 %, APPLY TO TOE TWICE DAILY, Disp: , Rfl:  .  NOVOLOG FLEXPEN 100 UNIT/ML FlexPen, Inject 20 Units into the skin 3 (three) times daily., Disp: , Rfl: 6 .  ondansetron (ZOFRAN-ODT) 4 MG disintegrating tablet, Take 4 mg by mouth every 4 (four) hours as needed for nausea/vomiting., Disp: , Rfl: 0 .  ONETOUCH VERIO test strip, USE TO TEST BLOOD SUGAR 3 TIMES A DAY PRIOR TO MEALS AND RECORD, Disp: , Rfl:  .  pantoprazole (PROTONIX) 40 MG tablet, Take 1 tablet (40 mg total) by mouth daily., Disp: 90 tablet, Rfl: 3 .  rosuvastatin (CRESTOR) 20 MG tablet, Take 20 mg by mouth daily., Disp: , Rfl:  .  zolpidem (AMBIEN) 5 MG tablet, Take 5 mg by mouth at bedtime., Disp: , Rfl:   Current Facility-Administered Medications:  .  0.9 %  sodium chloride infusion, 500 mL, Intravenous, Once, Jackquline Denmark, MD  Social History   Tobacco Use  Smoking Status Current Some Day Smoker  . Packs/day: 0.50  . Years: 20.00  . Pack years: 10.00  . Types: Cigarettes  Smokeless Tobacco Never Used    No Known Allergies Objective:  There were no vitals filed for this visit. There is no height or weight on file to calculate BMI. Constitutional Well developed. Well nourished.  Vascular Dorsalis pedis pulses non palpable bilaterally. Posterior tibial  pulses non palpable bilaterally. Capillary refill normal to all digits.  No cyanosis or clubbing noted. Pedal hair growth normal.  Neurologic Normal speech. Oriented to person, place, and time. Epicritic sensation to light touch grossly present bilaterally.  Dermatologic Nails well groomed and normal in appearance. No open wounds. No skin lesions.  Orthopedic:  Right hallux distal necrosis noted dry stable in nature without any malodor, cellulitis, purulent drainage noted.  These findings are consistent with gangrene.  No underlying ulcerations noted.  No crepitus noted.  No signs of wet gangrene noted.  Pain is improving  Right hallux nail is avulsing off with possible infection underneath it.   Radiographs: 3 views of skeletally mature adult right foot: No concern for osteomyelitis or bony destruction noted on the plain films.  No soft tissue emphysema or gas noted on the plain films. Assessment:   1. Diabetic peripheral neuropathy associated with type 2 diabetes mellitus (HCC)   2. Gangrene of toe of right foot ([place])   3. PAD (peripheral artery disease) ([place])    Plan:  Patient was evaluated and treated and all questions answered.  Right hallux gangrene actively~improving and still currently demarcating -I explained to patient the etiology of gangrene and various treatment options were discussed.  Patient also has a component of peripheral arterial disease that is likely playing a major role in beginning changes of micro vessel disease and lowering the flow to the right hallux.  At this point from my standpoint given that is not clinically infected will I will continue to monitor and watch.  I have instructed him to do Betadine wet-to-dry dressing changes.  Do not get the foot wet in the shower. -He had a vascular/angiogram performed recently which showedRight LE angiogram Treatment of occluded peroneal artery, with restoration of flow to the ankle, with directional atherectomy and DEB  of the TP trunk/peroneal artery Treatment of diseased SFA with DEB -Continue wearing surgical shoe -Continue Betadine wet-to-dry dressing changes 3 times a week. -Doxycycline was dispensed for skin and soft tissue prophylaxis. -Patient is a high risk of losing that digit versus the foot if this continues to get worse without any aggressive intervention.  I will continue to monitor/do local wound care for next couple weeks if there is no regression of the digit I will plan on amputation.  Patient states understanding -I have asked him to go to the emergency room if the wound continues to get worse and or if the gangrene becomes wet.  Patient states understanding   Nail contusion/dystrophy right hallux, right -Total nail right big toe is healing well.  No  clinical signs of infection noted. No follow-ups on file.   No follow-ups on file.

## 2020-04-07 ENCOUNTER — Ambulatory Visit: Payer: Self-pay | Attending: Internal Medicine

## 2020-04-07 DIAGNOSIS — Z23 Encounter for immunization: Secondary | ICD-10-CM

## 2020-04-07 NOTE — Progress Notes (Signed)
   Covid-19 Vaccination Clinic  Name:  George Franco    MRN: [id number] DOB: [birth year]  04/07/2020  George Franco was observed post Covid-19 immunization for 15 minutes without incident. He was provided with Vaccine Information Sheet and instruction to access the V-Safe system.   George Franco was instructed to call 911 with any severe reactions post vaccine: Marland Kitchen Difficulty breathing  . Swelling of face and throat  . A fast heartbeat  . A bad rash all over body  . Dizziness and weakness

## 2020-04-15 ENCOUNTER — Encounter: Payer: Self-pay | Admitting: *Deleted

## 2020-04-15 ENCOUNTER — Ambulatory Visit
Admission: RE | Admit: 2020-04-15 | Discharge: 2020-04-15 | Disposition: A | Discharge status: Home / Self Care | Payer: No Typology Code available for payment source | Source: Ambulatory Visit | Attending: Interventional Radiology | Admitting: Interventional Radiology

## 2020-04-15 DIAGNOSIS — I96 Gangrene, not elsewhere classified: Secondary | ICD-10-CM

## 2020-04-15 HISTORY — PX: IR RADIOLOGIST EVAL & MGMT: IMG5224

## 2020-04-15 NOTE — Progress Notes (Addendum)
Chief Complaint: Right great toe wound   Referring Physician(s): Dr Posey Pronto  PCP: Dr. [name]  History of Present Illness: George Franco is a 64 y.o. male presenting for scheduled follow up of right great toe wound, now SP revascularization of right lower extremity tibial occlusive disease, performed 03/05/20.   George Franco presents today for follow up by himself.   He tells me that he has been feeling fine, with no fevers/rigors/chills.  He continues to see Dr. Posey Pronto for wound care, and in fact has another appointment today.  He continues to take oral ABX.   We performed right lower extremity angiogram 03/05/20.  He was discovered to have occlusions of all 3 tibial arteries.  We were able to open up the peroneal artery inline to the ankle, with improved perfusion.      Past Medical History:  Diagnosis Date  . At risk for sleep apnea    STOP-BANG= 5     SENT TO PCP 06-02-2015  . BPH (benign prostatic hyperplasia)   . CTS (carpal tunnel syndrome)   . CTS (carpal tunnel syndrome)   . ED (erectile dysfunction) of organic origin   . Hyperlipidemia   . Hypertension   . Hypogonadism in male   . Knee pain    pt said he doesn't know anything about this  . Lower urinary tract symptoms (LUTS)   . LVH (left ventricular hypertrophy)   . Persistent vertigo of central origin   . Prostate cancer [facility]) urologist-  dr ottelin/  oncologist-  [name]   Stage T1c, Gleason 4+3, PSA 4.48,  vol 32.9cc--  External RXT complete 05-08-2015  . Stroke ([place])   . Type 2 diabetes mellitus ([place])   . Vestibular neuropathy   . Wears glasses     Past Surgical History:  Procedure Laterality Date  . COLONOSCOPY    . IR ANGIOGRAM EXTREMITY RIGHT  03/05/2020  . IR FEM POP ART PTA MOD SED  03/05/2020  . IR RADIOLOGIST EVAL & MGMT  02/26/2020  . IR RADIOLOGIST EVAL & MGMT  04/15/2020  . IR TIB-PERO ART ATHEREC INC PTA MOD SED  03/05/2020  . IR US GUIDE VASC ACCESS RIGHT  03/05/2020  . PROSTATE BIOPSY     . RADIOACTIVE SEED IMPLANT N/A 06/05/2015   Procedure: RADIOACTIVE SEED IMPLANT/BRACHYTHERAPY IMPLANT;  Surgeon: [name], MD;  Location: [place];  Service: Urology;  Laterality: N/A;  . ROTATOR CUFF REPAIR Right 2014  . UMBILICAL HERNIA REPAIR  1990's  . UPPER GASTROINTESTINAL ENDOSCOPY    . wisdom teeth extrat      Allergies: Patient has no known allergies.  Medications: Prior to Admission medications   Medication Sig Start Date End Date Taking? Authorizing Provider  aspirin EC 81 MG tablet Take 1 tablet (81 mg total) by mouth daily. 06/05/19   Melvenia Beam, MD  benazepril (LOTENSIN) 40 MG tablet Take 40 mg by mouth daily.    [provider]  clopidogrel (PLAVIX) 75 MG tablet Take 1 tablet (75 mg total) by mouth daily. 03/05/20   Docia Barrier, PA  doxycycline (VIBRA-TABS) 100 MG tablet Take 100 mg by mouth 2 (two) times daily. 08/21/19   [provider]  doxycycline (VIBRA-TABS) 100 MG tablet Take 1 tablet (100 mg total) by mouth 2 (two) times daily. 02/14/20   Felipa Furnace, DPM  ezetimibe (ZETIA) 10 MG tablet Take 10 mg by mouth daily. 07/13/19   [provider]  Wilder Glade  10 MG TABS tablet Take 10 mg by mouth daily. 12/29/17   [provider]  Insulin Degludec (TRESIBA) 100 UNIT/ML SOLN Inject 20 Units into the skin daily.    [provider]  loperamide (IMODIUM) 2 MG capsule Take 2 mg by mouth every 6 (six) hours as needed for constipation. 01/08/18   [provider]  meclizine (ANTIVERT) 25 MG tablet Take 1 tablet (25 mg total) by mouth 3 (three) times daily as needed for dizziness. 05/13/19   Melvenia Beam, MD  metoCLOPramide (REGLAN) 10 MG tablet Take 1 tablet (10 mg total) by mouth every 6 (six) hours. 01/17/18   Langston Masker B, PA-C  mupirocin ointment (BACTROBAN) 2 % APPLY TO TOE TWICE DAILY 08/21/19   [provider]  NOVOLOG FLEXPEN 100 UNIT/ML FlexPen Inject 20 Units into the  skin 3 (three) times daily. 12/12/17   [provider]  ondansetron (ZOFRAN-ODT) 4 MG disintegrating tablet Take 4 mg by mouth every 4 (four) hours as needed for nausea/vomiting. 01/08/18   [provider]  ONETOUCH VERIO test strip USE TO TEST BLOOD SUGAR 3 TIMES A DAY PRIOR TO MEALS AND RECORD 03/22/19   [provider]  pantoprazole (PROTONIX) 40 MG tablet Take 1 tablet (40 mg total) by mouth daily. 01/29/18   Jackquline Denmark, MD  rosuvastatin (CRESTOR) 20 MG tablet Take 20 mg by mouth daily. 07/19/19   [provider]  zolpidem (AMBIEN) 5 MG tablet Take 5 mg by mouth at bedtime. 07/14/19   [provider]     Family History  Problem Relation Age of Onset  . Prostate cancer Father   . Diabetes Father   . Diabetes Mother   . Diabetes Brother   . Colon cancer Neg Hx   . Esophageal cancer Neg Hx   . Liver cancer Neg Hx   . Pancreatic cancer Neg Hx   . Rectal cancer Neg Hx   . Stomach cancer Neg Hx     Social History   Socioeconomic History  . Marital status: Married    Spouse name: Not on file  . Number of children: 3  . Years of education: Not on file  . Highest education level: Not on file  Occupational History  . Occupation: Glass blower/designer    Employer: [employer]  Tobacco Use  . Smoking status: Current Some Day Smoker    Packs/day: 0.50    Years: 20.00    Pack years: 10.00    Types: Cigarettes  . Smokeless tobacco: Never Used  Vaping Use  . Vaping Use: Never used  Substance and Sexual Activity  . Alcohol use: Yes    Alcohol/week: 0.0 standard drinks    Comment: occasional  . Drug use: No  . Sexual activity: Yes    Partners: Female  Other Topics Concern  . 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 Difficulty of Paying Living Expenses: Not on file  Food Insecurity:   . Worried About Charity fundraiser in the Last Year: Not on file  . Ran Out of Food in the Last Year: Not on file  Transportation Needs:   . Lack  of Transportation (Medical): Not on file  . Lack of Transportation (Non-Medical): Not on file  Physical Activity:   . Days of Exercise per Week: Not on file  . Minutes of Exercise per Session: Not on file  Stress:   . Feeling of Stress : Not on file  Social Connections:   . Frequency of Communication with Friends and Family: Not on file  . Frequency of Social Gatherings with Friends and Family: Not on file  . Attends Religious Services: Not on file  . Active Member of Clubs or Organizations: Not on file  . Attends Archivist Meetings: Not on file  . Marital Status: Not on file       Review of Systems: A 12 point ROS discussed and pertinent positives are indicated in the HPI above.  All other systems are negative.  Review of Systems  Vital Signs: There were no vitals taken for this visit.  Physical Exam Targeted exam of the lower extremity.  His wound has improved, with further demarcation of the lateral toe/distal toe.  Dry nailbed, with flaking skin.  No new wound.  Good DP and PT pulses on doppler exam.  04/15/20, post     03/05/20, pre     Imaging: DG Foot Complete Right  Result Date: 03/20/2020 Please see detailed radiograph report in office note.  IR Radiologist Eval & Mgmt  Result Date: 04/15/2020 Please refer to notes tab for details about interventional procedure. (Op Note)   Labs:  CBC: Recent Labs    03/05/20 1228  WBC 5.5  HGB 12.8*  HCT 40.9  PLT 255    COAGS: Recent Labs    03/05/20 1228  INR 1.0    BMP: Recent Labs    03/05/20 1228  NA 141  K 4.0  CL 108  CO2 23  GLUCOSE 102*  BUN 9  CALCIUM 9.2  CREATININE 0.82  GFRNONAA >60  GFRAA >60    LIVER FUNCTION TESTS: No results  for input(s): BILITOT, AST, ALT, ALKPHOS, PROT, ALBUMIN in the last 8760 hours.  TUMOR MARKERS: No results for input(s): AFPTM, CEA, CA199, CHROMGRNA in the last 8760 hours.  Assessment and Plan:  George Franco is 64 yo male with history of right great toe wound, CLI, [place] 5.   He is SP revascularization of tibial occlusive disease.  We were able to restore in-line flow to the ankle/foot via peroneal artery 7/29.  He still has PT occlusion, and his distal AT terminates in abnormal collateral, indirectly filling the DP.  Given that this is the dominant flow to the forefoot, we did not address this.  It looks to me like he might have variant anatomy, as the distal peroneal artery might be the main anatomy into the AT, with occlusion of the segment that connects peroneal with AT.    He continues to have wound care with Dr. Posey Pronto, which I have encouraged.  Given his current trajectory, I would not employ any further revasc attempt, but if he were to plateau or regress, with concern for further tissue loss, I would offer further intervention with potential target of the PT.   Plan: - We will see him back in 3-4 months with wound check, no need for further imaging - I have encouraged him to observe his other follow ups     Electronically Signed: Corrie Mckusick 04/15/2020, 12:49 PM   I spent a total of    25 Minutes in face to face in clinical consultation, greater  than 50% of which was counseling/coordinating care for right great toe wound, SP revascularization of tibial occlusive disease.

## 2020-04-17 ENCOUNTER — Ambulatory Visit (INDEPENDENT_AMBULATORY_CARE_PROVIDER_SITE_OTHER): Payer: No Typology Code available for payment source | Admitting: Podiatry

## 2020-04-17 ENCOUNTER — Other Ambulatory Visit: Payer: Self-pay

## 2020-04-17 DIAGNOSIS — I96 Gangrene, not elsewhere classified: Secondary | ICD-10-CM | POA: Diagnosis not present

## 2020-04-17 DIAGNOSIS — M21619 Bunion of unspecified foot: Secondary | ICD-10-CM

## 2020-04-17 DIAGNOSIS — E1142 Type 2 diabetes mellitus with diabetic polyneuropathy: Secondary | ICD-10-CM

## 2020-04-21 ENCOUNTER — Encounter: Payer: Self-pay | Admitting: Podiatry

## 2020-04-21 NOTE — Progress Notes (Signed)
Subjective:  Patient ID: George Franco, male    DOB: 1956/04/15,  MRN: [id number]  Chief Complaint  Patient presents with  . Wound Check    PT has no concerns or pain . denies fever,chills,nausea and vomitting    64 y.o. male presents with the above complaint.  Patient presents with follow-up of right hallux gangrenous changes to the distal tip.  At this time patient no longer has gangrenous changes as there was adequate flow that was restored allowing him to heal appropriately.  He denies any other acute complaints   Review of Systems: Negative except as noted in the HPI. Denies N/V/F/Ch.  Past Medical History:  Diagnosis Date  . At risk for sleep apnea    STOP-BANG= 5     SENT TO PCP 06-02-2015  . BPH (benign prostatic hyperplasia)   . CTS (carpal tunnel syndrome)   . CTS (carpal tunnel syndrome)   . ED (erectile dysfunction) of organic origin   . Hyperlipidemia   . Hypertension   . Hypogonadism in male   . Knee pain    pt said he doesn't know anything about this  . Lower urinary tract symptoms (LUTS)   . LVH (left ventricular hypertrophy)   . Persistent vertigo of central origin   . Prostate cancer [facility]) urologist-  dr ottelin/  oncologist-  [name]   Stage T1c, Gleason 4+3, PSA 4.48,  vol 32.9cc--  External RXT complete 05-08-2015  . Stroke ([place])   . Type 2 diabetes mellitus ([place])   . Vestibular neuropathy   . Wears glasses     Current Outpatient Medications:  .  aspirin EC 81 MG tablet, Take 1 tablet (81 mg total) by mouth daily., Disp: 30 tablet, Rfl: 0 .  benazepril (LOTENSIN) 40 MG tablet, Take 40 mg by mouth daily., Disp: , Rfl:  .  clopidogrel (PLAVIX) 75 MG tablet, Take 1 tablet (75 mg total) by mouth daily., Disp: 30 tablet, Rfl: 2 .  doxycycline (VIBRA-TABS) 100 MG tablet, Take 100 mg by mouth 2 (two) times daily., Disp: , Rfl:  .  doxycycline (VIBRA-TABS) 100 MG tablet, Take 1 tablet (100 mg total) by mouth 2 (two) times daily., Disp: 28 tablet, Rfl: 0 .   ezetimibe (ZETIA) 10 MG tablet, Take 10 mg by mouth daily., Disp: , Rfl:  .  FARXIGA 10 MG TABS tablet, Take 10 mg by mouth daily., Disp: , Rfl:  .  Insulin Degludec (TRESIBA) 100 UNIT/ML SOLN, Inject 20 Units into the skin daily., Disp: , Rfl:  .  loperamide (IMODIUM) 2 MG capsule, Take 2 mg by mouth every 6 (six) hours as needed for constipation., Disp: , Rfl: 0 .  meclizine (ANTIVERT) 25 MG tablet, Take 1 tablet (25 mg total) by mouth 3 (three) times daily as needed for dizziness., Disp: 30 tablet, Rfl: 2 .  metoCLOPramide (REGLAN) 10 MG tablet, Take 1 tablet (10 mg total) by mouth every 6 (six) hours., Disp: 30 tablet, Rfl: 0 .  mupirocin ointment (BACTROBAN) 2 %, APPLY TO TOE TWICE DAILY, Disp: , Rfl:  .  NOVOLOG FLEXPEN 100 UNIT/ML FlexPen, Inject 20 Units into the skin 3 (three) times daily., Disp: , Rfl: 6 .  ondansetron (ZOFRAN-ODT) 4 MG disintegrating tablet, Take 4 mg by mouth every 4 (four) hours as needed for nausea/vomiting., Disp: , Rfl: 0 .  ONETOUCH VERIO test strip, USE TO TEST BLOOD SUGAR 3 TIMES A DAY PRIOR TO MEALS AND RECORD, Disp: , Rfl:  .  pantoprazole (  PROTONIX) 40 MG tablet, Take 1 tablet (40 mg total) by mouth daily., Disp: 90 tablet, Rfl: 3 .  rosuvastatin (CRESTOR) 20 MG tablet, Take 20 mg by mouth daily., Disp: , Rfl:  .  zolpidem (AMBIEN) 5 MG tablet, Take 5 mg by mouth at bedtime., Disp: , Rfl:   Current Facility-Administered Medications:  .  0.9 %  sodium chloride infusion, 500 mL, Intravenous, Once, Jackquline Denmark, MD  Social History   Tobacco Use  Smoking Status Current Some Day Smoker  . Packs/day: 0.50  . Years: 20.00  . Pack years: 10.00  . Types: Cigarettes  Smokeless Tobacco Never Used    No Known Allergies Objective:  There were no vitals filed for this visit. There is no height or weight on file to calculate BMI. Constitutional Well developed. Well nourished.  Vascular Dorsalis pedis pulses non palpable bilaterally. Posterior tibial pulses  non palpable bilaterally. Capillary refill normal to all digits.  No cyanosis or clubbing noted. Pedal hair growth normal.  Neurologic Normal speech. Oriented to person, place, and time. Epicritic sensation to light touch grossly present bilaterally.  Dermatologic Nails well groomed and normal in appearance. No open wounds. No skin lesions.  Orthopedic:  Right hallux distal necrosis has completely resolved.  Regular skin noted with good cap refill to the digit.  No further wound noted.  No clinical signs of infection noted.   Radiographs: None Assessment:   1. Gangrene of toe of right foot ([place])   2. Diabetic peripheral neuropathy associated with type 2 diabetes mellitus ([place])   3. Bunion    Plan:  Patient was evaluated and treated and all questions answered.  Right hallux gangrene actively~improving and still currently demarcating -Resolved.  It appears that patient has adequate flow to the right lower extremity as gangrenous site is resolving.  At this time he will stop doing any dressing changes.  He can return to regular sneakers if there is too much pressure then I have asked him to transition to surgical shoe until he obtains diabetic shoes.  Ideally I would like for him to be in diabetic shoes.  Right hallux bunion deformity with a history of previous ulceration -I explained patient given that he has bunion deformity and deformity of the foot he will benefit from diabetic shoes to offload and prevent future ulceration.  He will be scheduled see rec for diabetic shoes.   Nail contusion/dystrophy right hallux, right -Total nail right big toe is healing well.  No clinical signs of infection noted. No follow-ups on file.   No follow-ups on file.

## 2020-05-15 ENCOUNTER — Encounter: Payer: Self-pay | Admitting: Podiatry

## 2020-05-15 ENCOUNTER — Other Ambulatory Visit: Payer: Self-pay

## 2020-05-15 ENCOUNTER — Ambulatory Visit (INDEPENDENT_AMBULATORY_CARE_PROVIDER_SITE_OTHER): Payer: No Typology Code available for payment source | Admitting: Podiatry

## 2020-05-15 DIAGNOSIS — I96 Gangrene, not elsewhere classified: Secondary | ICD-10-CM

## 2020-05-15 DIAGNOSIS — E1142 Type 2 diabetes mellitus with diabetic polyneuropathy: Secondary | ICD-10-CM

## 2020-05-15 NOTE — Progress Notes (Signed)
Subjective:  Patient ID: George Franco, male    DOB: 10/25/1955,  MRN: [id number]  Chief Complaint  Patient presents with  . Wound Check    PT stated that he is doing good and the nail is starting to grow back     64 y.o. male presents with the above complaint.  Patient presents with follow-up of right hallux gangrenous changes to the distal tip.  At this time patient no longer has gangrenous changes as there was adequate flow that was restored allowing him to heal appropriately.  He denies any other acute complaints   Review of Systems: Negative except as noted in the HPI. Denies N/V/F/Ch.  Past Medical History:  Diagnosis Date  . At risk for sleep apnea    STOP-BANG= 5     SENT TO PCP 06-02-2015  . BPH (benign prostatic hyperplasia)   . CTS (carpal tunnel syndrome)   . CTS (carpal tunnel syndrome)   . ED (erectile dysfunction) of organic origin   . Hyperlipidemia   . Hypertension   . Hypogonadism in male   . Knee pain    pt said he doesn't know anything about this  . Lower urinary tract symptoms (LUTS)   . LVH (left ventricular hypertrophy)   . Persistent vertigo of central origin   . Prostate cancer [facility]) urologist-  dr ottelin/  oncologist-  [name]   Stage T1c, Gleason 4+3, PSA 4.48,  vol 32.9cc--  External RXT complete 05-08-2015  . Stroke ([place])   . Type 2 diabetes mellitus ([place])   . Vestibular neuropathy   . Wears glasses     Current Outpatient Medications:  .  aspirin EC 81 MG tablet, Take 1 tablet (81 mg total) by mouth daily., Disp: 30 tablet, Rfl: 0 .  benazepril (LOTENSIN) 40 MG tablet, Take 40 mg by mouth daily., Disp: , Rfl:  .  clopidogrel (PLAVIX) 75 MG tablet, Take 1 tablet (75 mg total) by mouth daily., Disp: 30 tablet, Rfl: 2 .  doxycycline (VIBRA-TABS) 100 MG tablet, Take 100 mg by mouth 2 (two) times daily., Disp: , Rfl:  .  doxycycline (VIBRA-TABS) 100 MG tablet, Take 1 tablet (100 mg total) by mouth 2 (two) times daily., Disp: 28 tablet, Rfl: 0 .   ezetimibe (ZETIA) 10 MG tablet, Take 10 mg by mouth daily., Disp: , Rfl:  .  FARXIGA 10 MG TABS tablet, Take 10 mg by mouth daily., Disp: , Rfl:  .  Insulin Degludec (TRESIBA) 100 UNIT/ML SOLN, Inject 20 Units into the skin daily., Disp: , Rfl:  .  loperamide (IMODIUM) 2 MG capsule, Take 2 mg by mouth every 6 (six) hours as needed for constipation., Disp: , Rfl: 0 .  meclizine (ANTIVERT) 25 MG tablet, Take 1 tablet (25 mg total) by mouth 3 (three) times daily as needed for dizziness., Disp: 30 tablet, Rfl: 2 .  metoCLOPramide (REGLAN) 10 MG tablet, Take 1 tablet (10 mg total) by mouth every 6 (six) hours., Disp: 30 tablet, Rfl: 0 .  mupirocin ointment (BACTROBAN) 2 %, APPLY TO TOE TWICE DAILY, Disp: , Rfl:  .  NOVOLOG FLEXPEN 100 UNIT/ML FlexPen, Inject 20 Units into the skin 3 (three) times daily., Disp: , Rfl: 6 .  ondansetron (ZOFRAN-ODT) 4 MG disintegrating tablet, Take 4 mg by mouth every 4 (four) hours as needed for nausea/vomiting., Disp: , Rfl: 0 .  ONETOUCH VERIO test strip, USE TO TEST BLOOD SUGAR 3 TIMES A DAY PRIOR TO MEALS AND RECORD, Disp: ,  Rfl:  .  pantoprazole (PROTONIX) 40 MG tablet, Take 1 tablet (40 mg total) by mouth daily., Disp: 90 tablet, Rfl: 3 .  rosuvastatin (CRESTOR) 20 MG tablet, Take 20 mg by mouth daily., Disp: , Rfl:  .  zolpidem (AMBIEN) 5 MG tablet, Take 5 mg by mouth at bedtime., Disp: , Rfl:   Current Facility-Administered Medications:  .  0.9 %  sodium chloride infusion, 500 mL, Intravenous, Once, Jackquline Denmark, MD  Social History   Tobacco Use  Smoking Status Current Some Day Smoker  . Packs/day: 0.50  . Years: 20.00  . Pack years: 10.00  . Types: Cigarettes  Smokeless Tobacco Never Used    No Known Allergies Objective:  There were no vitals filed for this visit. There is no height or weight on file to calculate BMI. Constitutional Well developed. Well nourished.  Vascular Dorsalis pedis pulses non palpable bilaterally. Posterior tibial pulses  non palpable bilaterally. Capillary refill normal to all digits.  No cyanosis or clubbing noted. Pedal hair growth normal.  Neurologic Normal speech. Oriented to person, place, and time. Epicritic sensation to light touch grossly present bilaterally.  Dermatologic Nails well groomed and normal in appearance. No open wounds. No skin lesions.  Orthopedic:  Right hallux distal necrosis has completely resolved.  Regular skin noted with good cap refill to the digit.  No further wound noted.  No clinical signs of infection noted.   Radiographs: None Assessment:   1. Gangrene of toe of right foot ([place])   2. Diabetic peripheral neuropathy associated with type 2 diabetes mellitus ([place])    Plan:  Patient was evaluated and treated and all questions answered.  Right hallux gangrene actively~improving and still currently demarcating -Resolved.  It appears that patient has adequate flow to the right lower extremity as gangrenous site is resolving.  At this time he will stop doing any dressing changes.  He can return to regular sneakers if there is too much pressure then I have asked him to transition to surgical shoe until he obtains diabetic shoes.  Ideally I would like for him to be in diabetic shoes.  Right hallux bunion deformity with a history of previous ulceration -I explained patient given that he has bunion deformity and deformity of the foot he will benefit from diabetic shoes to offload and prevent future ulceration.  He will be scheduled see rec for diabetic shoes.   Nail contusion/dystrophy right hallux, right -Total nail right big toe is healing well.  No clinical signs of infection noted. No follow-ups on file.   No follow-ups on file.

## 2020-06-01 ENCOUNTER — Other Ambulatory Visit: Payer: Self-pay

## 2020-06-01 ENCOUNTER — Ambulatory Visit: Payer: No Typology Code available for payment source | Admitting: Orthotics

## 2020-06-01 DIAGNOSIS — I96 Gangrene, not elsewhere classified: Secondary | ICD-10-CM

## 2020-06-01 DIAGNOSIS — E1142 Type 2 diabetes mellitus with diabetic polyneuropathy: Secondary | ICD-10-CM

## 2020-06-01 NOTE — Progress Notes (Signed)
Patient self pay DBS and 0ne pair of inserts.George Franco

## 2020-06-16 DIAGNOSIS — M79676 Pain in unspecified toe(s): Secondary | ICD-10-CM

## 2020-07-13 ENCOUNTER — Ambulatory Visit: Payer: No Typology Code available for payment source | Admitting: Orthotics

## 2020-07-13 ENCOUNTER — Other Ambulatory Visit: Payer: Self-pay

## 2020-07-13 DIAGNOSIS — E1142 Type 2 diabetes mellitus with diabetic polyneuropathy: Secondary | ICD-10-CM

## 2020-07-13 DIAGNOSIS — M21619 Bunion of unspecified foot: Secondary | ICD-10-CM

## 2020-07-13 DIAGNOSIS — I96 Gangrene, not elsewhere classified: Secondary | ICD-10-CM

## 2020-07-15 ENCOUNTER — Other Ambulatory Visit: Payer: Self-pay | Admitting: Interventional Radiology

## 2020-07-15 DIAGNOSIS — I96 Gangrene, not elsewhere classified: Secondary | ICD-10-CM

## 2020-07-22 ENCOUNTER — Encounter: Payer: Self-pay | Admitting: *Deleted

## 2020-07-22 ENCOUNTER — Ambulatory Visit
Admission: RE | Admit: 2020-07-22 | Discharge: 2020-07-22 | Disposition: A | Discharge status: Home / Self Care | Payer: No Typology Code available for payment source | Source: Ambulatory Visit | Attending: Interventional Radiology | Admitting: Interventional Radiology

## 2020-07-22 DIAGNOSIS — I96 Gangrene, not elsewhere classified: Secondary | ICD-10-CM

## 2020-07-22 HISTORY — PX: IR RADIOLOGIST EVAL & MGMT: IMG5224

## 2020-07-22 NOTE — Progress Notes (Signed)
Patient picked up self pay shoes.

## 2020-07-22 NOTE — Progress Notes (Addendum)
Chief Complaint: Right great toe wound, SP revascularization  Referring Physician(s): Patel,Kevin P  History of Present Illness: George Franco is a 64 y.o. male presenting as a scheduled follow up to [place] clinic, SP right lower extremity angiogram/intervention for CLTI/CLI.   He was treated  03/05/20, with angiogram and treatment of femoral-popliteal stenosis and occluded peroneal artery.   Since his treatment, he has continued with podiatric care/wound care, and has nearly completely healed the wound of the right great toe.   He denies pain, fevers, rigors, chills.  He is using orthopedic shoes at this point.   He continues maximal medical therapy.  He does admit to ongoing smoking, with a cigarette "once in a while".       Past Medical History:  Diagnosis Date  . At risk for sleep apnea    STOP-BANG= 5     SENT TO PCP 06-02-2015  . BPH (benign prostatic hyperplasia)   . CTS (carpal tunnel syndrome)   . CTS (carpal tunnel syndrome)   . ED (erectile dysfunction) of organic origin   . Hyperlipidemia   . Hypertension   . Hypogonadism in male   . Knee pain    pt said he doesn't know anything about this  . Lower urinary tract symptoms (LUTS)   . LVH (left ventricular hypertrophy)   . Persistent vertigo of central origin   . Prostate cancer [facility]) urologist-  dr ottelin/  oncologist-  [name]   Stage T1c, Gleason 4+3, PSA 4.48,  vol 32.9cc--  External RXT complete 05-08-2015  . Stroke ([place])   . Type 2 diabetes mellitus ([place])   . Vestibular neuropathy   . Wears glasses     Past Surgical History:  Procedure Laterality Date  . COLONOSCOPY    . IR ANGIOGRAM EXTREMITY RIGHT  03/05/2020  . IR FEM POP ART PTA MOD SED  03/05/2020  . IR RADIOLOGIST EVAL & MGMT  02/26/2020  . IR RADIOLOGIST EVAL & MGMT  04/15/2020  . IR RADIOLOGIST EVAL & MGMT  07/22/2020  . IR TIB-PERO ART ATHEREC INC PTA MOD SED  03/05/2020  . IR US GUIDE VASC ACCESS RIGHT  03/05/2020  . PROSTATE BIOPSY     . RADIOACTIVE SEED IMPLANT N/A 06/05/2015   Procedure: RADIOACTIVE SEED IMPLANT/BRACHYTHERAPY IMPLANT;  Surgeon: [name], MD;  Location: [place];  Service: Urology;  Laterality: N/A;  . ROTATOR CUFF REPAIR Right 2014  . UMBILICAL HERNIA REPAIR  1990's  . UPPER GASTROINTESTINAL ENDOSCOPY    . wisdom teeth extrat      Allergies: Patient has no known allergies.  Medications: Prior to Admission medications   Medication Sig Start Date End Date Taking? Authorizing Provider  aspirin EC 81 MG tablet Take 1 tablet (81 mg total) by mouth daily. 06/05/19   Melvenia Beam, MD  benazepril (LOTENSIN) 40 MG tablet Take 40 mg by mouth daily.    [provider]  clopidogrel (PLAVIX) 75 MG tablet Take 1 tablet (75 mg total) by mouth daily. 03/05/20   Docia Barrier, PA  doxycycline (VIBRA-TABS) 100 MG tablet Take 100 mg by mouth 2 (two) times daily. 08/21/19   [provider]  doxycycline (VIBRA-TABS) 100 MG tablet Take 1 tablet (100 mg total) by mouth 2 (two) times daily. 02/14/20   Felipa Furnace, DPM  ezetimibe (ZETIA) 10 MG tablet Take 10 mg by mouth daily. 07/13/19   [provider]  FARXIGA 10 MG TABS tablet Take 10 mg  by mouth daily. 12/29/17   [provider]  Insulin Degludec (TRESIBA) 100 UNIT/ML SOLN Inject 20 Units into the skin daily.    [provider]  loperamide (IMODIUM) 2 MG capsule Take 2 mg by mouth every 6 (six) hours as needed for constipation. 01/08/18   [provider]  meclizine (ANTIVERT) 25 MG tablet Take 1 tablet (25 mg total) by mouth 3 (three) times daily as needed for dizziness. 05/13/19   Melvenia Beam, MD  metoCLOPramide (REGLAN) 10 MG tablet Take 1 tablet (10 mg total) by mouth every 6 (six) hours. 01/17/18   Langston Masker B, PA-C  mupirocin ointment (BACTROBAN) 2 % APPLY TO TOE TWICE DAILY 08/21/19   [provider]  NOVOLOG FLEXPEN 100 UNIT/ML FlexPen Inject 20 Units into the  skin 3 (three) times daily. 12/12/17   [provider]  ondansetron (ZOFRAN-ODT) 4 MG disintegrating tablet Take 4 mg by mouth every 4 (four) hours as needed for nausea/vomiting. 01/08/18   [provider]  ONETOUCH VERIO test strip USE TO TEST BLOOD SUGAR 3 TIMES A DAY PRIOR TO MEALS AND RECORD 03/22/19   [provider]  pantoprazole (PROTONIX) 40 MG tablet Take 1 tablet (40 mg total) by mouth daily. 01/29/18   Jackquline Denmark, MD  rosuvastatin (CRESTOR) 20 MG tablet Take 20 mg by mouth daily. 07/19/19   [provider]  zolpidem (AMBIEN) 5 MG tablet Take 5 mg by mouth at bedtime. 07/14/19   [provider]     Family History  Problem Relation Age of Onset  . Prostate cancer Father   . Diabetes Father   . Diabetes Mother   . Diabetes Brother   . Colon cancer Neg Hx   . Esophageal cancer Neg Hx   . Liver cancer Neg Hx   . Pancreatic cancer Neg Hx   . Rectal cancer Neg Hx   . Stomach cancer Neg Hx     Social History   Socioeconomic History  . Marital status: Married    Spouse name: Not on file  . Number of children: 3  . Years of education: Not on file  . Highest education level: Not on file  Occupational History  . Occupation: Glass blower/designer    Employer: [employer]  Tobacco Use  . Smoking status: Current Some Day Smoker    Packs/day: 0.50    Years: 20.00    Pack years: 10.00    Types: Cigarettes  . Smokeless tobacco: Never Used  Vaping Use  . Vaping Use: Never used  Substance and Sexual Activity  . Alcohol use: Yes    Alcohol/week: 0.0 standard drinks    Comment: occasional  . Drug use: No  . Sexual activity: Yes    Partners: Female  Other Topics Concern  . 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  All  other systems are negative.  Review of Systems  Vital Signs: There were no vitals taken for this visit.  Physical Exam Targeted exam of the lower extremity reveals doppler positive signal of the AT and the PT.  He has had near complete healing of the right great toe, at the nail bed.  No erythema. Dryness of the plantar foot continues, with some flaking.     Mallampati Score:     Imaging: IR Radiologist Eval & Mgmt  Result Date: 07/22/2020 Please refer to notes tab for details about interventional procedure. (Op Note)   Labs:  CBC: Recent Labs    03/05/20 1228  WBC 5.5  HGB 12.8*  HCT 40.9  PLT 255    COAGS: Recent Labs    03/05/20 1228  INR 1.0    BMP: Recent Labs    03/05/20 1228  NA 141  K 4.0  CL 108  CO2 23  GLUCOSE 102*  BUN 9  CALCIUM 9.2  CREATININE 0.82  GFRNONAA >60  GFRAA >60    LIVER FUNCTION TESTS: No results for input(s): BILITOT, AST, ALT, ALKPHOS, PROT, ALBUMIN in the last 8760 hours.  TUMOR MARKERS: No results for input(s): AFPTM, CEA, CA199, CHROMGRNA in the last 8760 hours.  Assessment and Plan:  Mr. Ream is 64 yo male SP treatment of RLE PAD with revascularization of fem-pop stenosis and occluded peroneal artery 03/05/20, for great toe wound.   He has achieved near complete healing of the nail bed wound.   He is quite satisfied with his result.  He continues maximal medical therapy.  I did let him know that we would be happy to see him back on as needed basis, and to continue efforts for smoking cessation.   Plan: - Continue maximal medical therapy. - We are happy to see him back on as needed basis    Electronically  Signed: Corrie Mckusick 07/22/2020, 3:22 PM   I spent a total of    25 Minutes in face to face in clinical consultation, greater than 50% of which was counseling/coordinating care for right lower extremity wound/CLTI, SP angiogram and intervention

## 2020-09-24 DIAGNOSIS — M79676 Pain in unspecified toe(s): Secondary | ICD-10-CM

## 2020-09-28 ENCOUNTER — Telehealth: Payer: Self-pay | Admitting: Neurology

## 2020-09-28 NOTE — Telephone Encounter (Signed)
Patient can see Janett Billow NP. He has been seen in past for stroke and vertigo.

## 2020-09-28 NOTE — Telephone Encounter (Signed)
Pt called, 2 months ago starting getting dizzy where I could hardly walk. Last week PCP prescribed meclizine. PCP instructed me to see my neurologist. I have scheduled an appt 12/30/20, but I need a sooner appt. Would like a call from the nurse.

## 2020-09-30 ENCOUNTER — Encounter: Payer: Self-pay | Admitting: Adult Health

## 2020-09-30 ENCOUNTER — Ambulatory Visit (INDEPENDENT_AMBULATORY_CARE_PROVIDER_SITE_OTHER): Payer: BLUE CROSS/BLUE SHIELD | Admitting: Adult Health

## 2020-09-30 ENCOUNTER — Other Ambulatory Visit: Payer: Self-pay

## 2020-09-30 VITALS — BP 129/74 | HR 94 | Ht 69.0 in | Wt 185.0 lb

## 2020-09-30 DIAGNOSIS — H8110 Benign paroxysmal vertigo, unspecified ear: Secondary | ICD-10-CM

## 2020-09-30 MED ORDER — ONDANSETRON 4 MG PO TBDP
4.0000 mg | ORAL_TABLET | Freq: Three times a day (TID) | ORAL | 0 refills | Status: DC | PRN
Start: 2020-09-30 — End: 2020-12-30

## 2020-09-30 NOTE — Patient Instructions (Signed)
Your Plan:  Start vestibular therapy as your symptoms are likely related to benign positional vertigo  Use of Zofran as needed for continued dizziness as well as ongoing use of meclizine     Thank you for coming to see [place] at [facility]. I hope we have been able to provide you high quality care today.  You may receive a patient satisfaction survey over the next few weeks. We would appreciate your feedback and comments so that we may continue to improve ourselves and the health of our patients.     Benign Positional Vertigo Vertigo is the feeling that you or your surroundings are moving when they are not. Benign positional vertigo is the most common form of vertigo. This is usually a harmless condition (benign). This condition is positional. This means that symptoms are triggered by certain movements and positions. This condition can be dangerous if it occurs while you are doing something that could cause harm to you or others. This includes activities such as driving or operating machinery. What are the causes? The inner ear has fluid-filled canals that help your brain sense movement and balance. When the fluid moves, the brain receives messages about your body's position. With benign positional vertigo, crystals in the inner ear break free and disturb the inner ear area. This causes your brain to receive confusing messages about your body's position. What increases the risk? You are more likely to develop this condition if:  You are a woman.  You are 93 years of age or older.  You have recently had a head injury.  You have an inner ear disease. What are the signs or symptoms? Symptoms of this condition usually happen when you move your head or your eyes in different directions. Symptoms may start suddenly, and usually last for less than a minute. They include:  Loss of balance and falling.  Feeling like you are spinning or moving.  Feeling like your  surroundings are spinning or moving.  Nausea and vomiting.  Blurred vision.  Dizziness.  Involuntary eye movement (nystagmus). Symptoms can be mild and cause only minor problems, or they can be severe and interfere with daily life. Episodes of benign positional vertigo may return (recur) over time. Symptoms may improve over time. How is this diagnosed? This condition may be diagnosed based on:  Your medical history.  Physical exam of the head, neck, and ears.  Positional tests to check for or stimulate vertigo. You may be asked to turn your head and change positions, such as going from sitting to lying down. A health care provider will watch for symptoms of vertigo. You may be referred to a health care provider who specializes in ear, nose, and throat problems (ENT, or otolaryngologist) or a provider who specializes in disorders of the nervous system (neurologist). How is this treated? This condition may be treated in a session in which your health care provider moves your head in specific positions to help the displaced crystals in your inner ear move. Treatment for this condition may take several sessions. Surgery may be needed in severe cases, but this is rare. In some cases, benign positional vertigo may resolve on its own in 2-4 weeks.   Follow these instructions at home: Safety  Move slowly. Avoid sudden body or head movements or certain positions, as told by your health care provider.  Avoid driving until your health care provider says it is safe for you to do so.  Avoid operating heavy machinery until your  health care provider says it is safe for you to do so.  Avoid doing any tasks that would be dangerous to you or others if vertigo occurs.  If you have trouble walking or keeping your balance, try using a cane for stability. If you feel dizzy or unstable, sit down right away.  Return to your normal activities as told by your health care provider. Ask your health care  provider what activities are safe for you. General instructions  Take over-the-counter and prescription medicines only as told by your health care provider.  Drink enough fluid to keep your urine pale yellow.  Keep all follow-up visits as told by your health care provider. This is important. Contact a health care provider if:  You have a fever.  Your condition gets worse or you develop new symptoms.  Your family or friends notice any behavioral changes.  You have nausea or vomiting that gets worse.  You have numbness or a prickling and tingling sensation. Get help right away if you:  Have difficulty speaking or moving.  Are always dizzy.  Faint.  Develop severe headaches.  Have weakness in your legs or arms.  Have changes in your hearing or vision.  Develop a stiff neck.  Develop sensitivity to light. Summary  Vertigo is the feeling that you or your surroundings are moving when they are not. Benign positional vertigo is the most common form of vertigo.  This condition is caused by crystals in the inner ear that become displaced. This causes a disturbance in an area of the inner ear that helps your brain sense movement and balance.  Symptoms include loss of balance and falling, feeling that you or your surroundings are moving, nausea and vomiting, and blurred vision.  This condition can be diagnosed based on symptoms, a physical exam, and positional tests.  Follow safety instructions as told by your health care provider. You will also be told when to contact your health care provider in case of problems. This information is not intended to replace advice given to you by your health care provider. Make sure you discuss any questions you have with your health care provider. Document Revised: 06/18/2019 Document Reviewed: 01/03/2018 Elsevier Patient Education  2021 [employer].

## 2020-09-30 NOTE — Progress Notes (Addendum)
[id number]    Provider:  Dr Jaynee Eagles Requesting Provider: [name], MD Primary Care Provider:  [name], MD    CC: Worsening dizziness and vertigo   HPI:  Today, 09/30/2020, George Franco is being seen for acute visit. He was previously seen by Dr. Jaynee Eagles in 05/2019 for vertigo type episodes and stable since that time but reports 2-week onset of dizziness greatly interfering with ambulation. Evaluated by PCP who prescribed meclizine and advised to follow-up with our office for further evaluation.  Reports dizziness has been constant present upon awakening and worsening throughout the day.  He also reports worsening when turning head quickly from side to side or when he stands from sitting for prolonged period.  Denies symptoms while sitting.  He has been using a cane which he was using previously and denies any recent falls.  Use of meclizine without benefit.  He will occasionally have difficulty focusing with both eyes open but denies blurred or double vision.  He does admit to having a prescription for glasses but lost them and has not had recent follow-up with ophthalmology.  Denies weakness, numbness/tingling, speech or language difficulties or headache.  Towards the end of visit, he also reports diminished appetite and minimal to no fluid intake throughout the day  Recent lab work by PCP personally reviewed which was all satisfactory    History provided for reference purposes only Update 06/05/2019 Dr. Jaynee Eagles: today patient comes in for follow up, multiple lacunar strokes in the right pons, discussed lacunar strokes, causes, he smells heavily of cigarette smoke and we discussed smoking as a big risk factor, cholesterol, diabetes. We reviewed all the images togather, showed him the microvascular chronic changes. He needs close follow up and strict management of vascular risk factors. Will complete stroke workup with CTA head and echocardiogram.   Initial visit  05/13/2019 Dr. Jaynee Eagles:  George Franco is a 65 y.o. male here as requested by [name], MD for vertigo.  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 Vertigo started several years ago, it is episodic, it hits him "all of a sudden", he feels dizzy and feels like he is going to fall. He is going to have another MRI 05/28/2019 ordered by Dr. Forde Dandy. Vertigo lasts all day, if he turn to the left the vertigo worsens, he says the episodes come and go and he started having the symptoms sine 8am. If he turns his head he can make the symptoms worse. He was shown the Epley Maneuvers by ENT. Never been to vestibular therapy. No vomiting. No weakness or vision changes or any other focal deficit. Sitting still helps the vertigo. No inciting events, he has several episodes a year lasting 1-2 days. He sometimes has headaches, frontal, dull not migrainous. Lack of sleep worsens. Melatonin helps him sleep. No other focal neurologic deficits, associated symptoms, inciting events or modifiable factors.  Reviewed notes, labs and imaging from outside physicians, which showed: 03/25/2019: Ct w/wo showed No acute intracranial abnormalities including mass lesion or mass effect, hydrocephalus, extra-axial fluid collection, midline shift, hemorrhage, or acute infarction, large ischemic events (personally reviewed images)  Reviewed MRI brain report 11/2014: FINDINGS: Major intracranial vascular flow voids are stable. Cerebral volume is normal. No restricted diffusion to suggest acute infarction. No midline shift, mass effect, evidence of mass lesion, ventriculomegaly,  extra-axial collection or acute intracranial hemorrhage. Cervicomedullary junction and pituitary are within normal limits. Negative visualized cervical  spine.  Cbc normal, cmp with elevated glucose otherwise unremarkable  Pearline Cables and white matter signal throughout the brain appears within normal limits for age and not changed since 2007. No cortical encephalomalacia or chronic blood products identified.  Diminutive appearance of both IAC which is chronic. Other visible internal auditory structures appear grossly normal in the mastoids are clear.  Negative paranasal sinuses. Visualized orbit soft tissues are within normal limits. Normal bone marrow signal. Visualized scalp soft tissues are within normal limits.  IMPRESSION: 1. Stable and essentially normal for age noncontrast MRI appearance of the brain. 2. Chronic diminutive appearance of the bony internal auditory canals. Is there any evidence of vestibulocochlear neuropathy, or any other cranial neuropathies to suggest entrapment such as due to hyperostosis crani  alis interna?  I reviewed Dr. Baldwin Crown notes.  Patient presented for short-term disability and restrictions form to be filled out.  Dizziness had improved "a little bit" at last appointment.  He had been offered neurology referral in the past but declined but at the last appointment wanted to see neurology.  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  No vision changes.  I reviewed examination which showed slightly elevated blood pressure at 155/74, otherwise physical exam was normal, he did complain of vertigo, dizziness and gait instability.   Review of Systems: Patient complains of symptoms per HPI as well as the following symptoms: Dizziness. Pertinent negatives and positives per HPI. All others negative.   Social History   Socioeconomic History  . Marital status: Married    Spouse name: Not on file  . Number of children: 3  .  Years of education: Not on file  . Highest education level: Not on file  Occupational History  . Occupation: Glass blower/designer    Employer: [employer]  Tobacco Use  . Smoking status: Current Some Day Smoker    Packs/day: 0.50    Years: 20.00    Pack years: 10.00    Types: Cigarettes  . Smokeless tobacco: Never Used  Vaping Use  . Vaping Use: Never used  Substance and Sexual Activity  . Alcohol use: Yes    Alcohol/week: 0.0 standard drinks    Comment: occasional  . Drug use: No  . Sexual activity: Yes    Partners: Female  Other Topics Concern  . 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 Prostate cancer Father   . Diabetes Father   . Diabetes Mother   . Diabetes Brother   . Colon cancer Neg Hx   . Esophageal cancer Neg Hx   . Liver cancer  Neg Hx   . Pancreatic cancer Neg Hx   . Rectal cancer Neg Hx   . Stomach cancer Neg Hx     Past Medical History:  Diagnosis Date  . At risk for sleep apnea    STOP-BANG= 5     SENT TO PCP 06-02-2015  . BPH (benign prostatic hyperplasia)   . CTS (carpal tunnel syndrome)   . CTS (carpal tunnel syndrome)   . ED (erectile dysfunction) of organic origin   . Hyperlipidemia   . Hypertension   . Hypogonadism in male   . Knee pain    pt said he doesn't know anything about this  . Lower urinary tract symptoms (LUTS)   . LVH (left ventricular hypertrophy)   . Persistent vertigo of central origin   . Prostate cancer [facility]) urologist-  dr  ottelin/  oncologist-  [name]   Stage T1c, Gleason 4+3, PSA 4.48,  vol 32.9cc--  External RXT complete 05-08-2015  . Stroke ([place])   . Type 2 diabetes mellitus ([place])   . Vestibular neuropathy   . Wears glasses     Patient Active Problem List   Diagnosis Date Noted  . Lacunar stroke ([place]) 06/05/2019  . BPPV (benign paroxysmal positional vertigo) 05/13/2019  . Abnormal CT scan of lung - RLL ground glass density 01/26/2018  . Malignant neoplasm of prostate ([place]) 01/26/2015  . Dizziness and giddiness 11/18/2014  . Essential hypertension, benign 11/18/2014    Past Surgical History:  Procedure Laterality Date  . COLONOSCOPY    . IR ANGIOGRAM EXTREMITY RIGHT  03/05/2020  . IR FEM POP ART PTA MOD SED  03/05/2020  . IR RADIOLOGIST EVAL & MGMT  02/26/2020  . IR RADIOLOGIST EVAL & MGMT  04/15/2020  . IR RADIOLOGIST EVAL & MGMT  07/22/2020  . IR TIB-PERO ART ATHEREC INC PTA MOD SED  03/05/2020  . IR US GUIDE VASC ACCESS RIGHT  03/05/2020  . PROSTATE BIOPSY    . RADIOACTIVE SEED IMPLANT N/A 06/05/2015   Procedure: RADIOACTIVE SEED IMPLANT/BRACHYTHERAPY IMPLANT;  Surgeon: [name], MD;  Location: [facility];  Service: Urology;  Laterality: N/A;  . ROTATOR CUFF REPAIR Right 2014  . UMBILICAL HERNIA REPAIR  1990's  . UPPER GASTROINTESTINAL ENDOSCOPY    . wisdom teeth extrat      Current Outpatient Medications  Medication Sig Dispense Refill  . aspirin EC 81 MG tablet Take 1 tablet (81 mg total) by mouth daily. 30 tablet 0  . benazepril (LOTENSIN) 40 MG tablet Take 40 mg by mouth daily.    . clopidogrel (PLAVIX) 75 MG tablet Take 1 tablet (75 mg total) by mouth daily. 30 tablet 2  . doxycycline (VIBRA-TABS) 100 MG tablet Take 100 mg by mouth 2 (two) times daily.    Marland Kitchen doxycycline (VIBRA-TABS) 100 MG tablet Take 1 tablet (100 mg total) by mouth 2 (two) times daily. 28 tablet 0  . ezetimibe (ZETIA) 10 MG tablet Take 10 mg by mouth daily.    Marland Kitchen FARXIGA 10 MG TABS tablet Take 10  mg by mouth daily.    . Insulin Degludec 100 UNIT/ML SOLN Inject 20 Units into the skin daily.    Marland Kitchen loperamide (IMODIUM) 2 MG capsule Take 2 mg by mouth every 6 (six) hours as needed for constipation.  0  . meclizine (ANTIVERT) 25 MG tablet Take 1 tablet (25 mg total) by mouth 3 (three) times daily as needed for dizziness. 30 tablet 2  . metoCLOPramide (REGLAN) 10  MG tablet Take 1 tablet (10 mg total) by mouth every 6 (six) hours. 30 tablet 0  . mupirocin ointment (BACTROBAN) 2 % APPLY TO TOE TWICE DAILY    . NOVOLOG FLEXPEN 100 UNIT/ML FlexPen Inject 20 Units into the skin 3 (three) times daily.  6  . ondansetron (ZOFRAN ODT) 4 MG disintegrating tablet Take 1 tablet (4 mg total) by mouth every 8 (eight) hours as needed for nausea or vomiting (vertigo). 20 tablet 0  . ONETOUCH VERIO test strip USE TO TEST BLOOD SUGAR 3 TIMES A DAY PRIOR TO MEALS AND RECORD    . pantoprazole (PROTONIX) 40 MG tablet Take 1 tablet (40 mg total) by mouth daily. 90 tablet 3  . rosuvastatin (CRESTOR) 20 MG tablet Take 20 mg by mouth daily.    Marland Kitchen zolpidem (AMBIEN) 5 MG tablet Take 5 mg by mouth at bedtime.     Current Facility-Administered Medications  Medication Dose Route Frequency Provider Last Rate Last Admin  . 0.9 %  sodium chloride infusion  500 mL Intravenous Once Jackquline Denmark, MD        Allergies as of 09/30/2020  . (No Known Allergies)    Vitals: Today's Vitals   09/30/20 1046  BP: 129/74  Pulse: 94  Weight: 185 lb (83.9 kg)  Height: 5\' 9"  (1.753 m)   Body mass index is 27.32 kg/m.   General: well developed, well nourished, pleasant elderly African-American male, seated, in no evident distress Head: head normocephalic and atraumatic.   Neck: supple with no carotid or supraclavicular bruits Cardiovascular: regular rate and rhythm, no murmurs Musculoskeletal: no deformity Skin:  no rash/petichiae Vascular:  Normal pulses all extremities   Neurologic Exam Mental Status: Awake and fully  alert.   Fluent speech and language.  Oriented to place and time. Recent and remote memory slightly impaired.  Attention span, concentration and fund of knowledge mostly appropriate during visit with some impairment. Mood and affect appropriate.  Cranial Nerves: Pupils equal, briskly reactive to light. Extraocular movements full without nystagmus. Visual fields full to confrontation. Hearing intact. Facial sensation intact. Face, tongue, palate moves normally and symmetrically.  Motor: Normal bulk and tone. Normal strength in all tested extremity muscles Sensory.: intact to touch , pinprick , position and vibratory sensation.  Coordination: Rapid alternating movements normal in all extremities. Finger-to-nose and heel-to-shin performed accurately bilaterally. Gait and Station: Arises from chair without difficulty. Stance is normal. Gait demonstrates  broad-based gait with mild imbalance and unsteadiness with use of cane.  Heel toe and tandem walk and Romberg not attempted due to reported subjective dizziness. Reflexes: 1+ and symmetric. Toes downgoing.      Assessment/Plan:  65 year old with worsening dizziness/vertigo.  Previously seen by Dr. Jaynee Eagles in 05/2019 for episodic dizziness/vertigo likely in setting of BPPV with imaging showing evidence of multiple pontine infarcts on imaging which was new since prior imaging in 2016 likely in setting of small vessel disease.     Difficulty with full history although likely multifactorial as he reports constant dizziness worsening throughout the day but also reports increased dizziness with position changes or head movements and denies dizziness at rest  Hx of BPPV with some features consistent with BPPV therefore recommend vestibular rehab as well as ongoing use of meclizine per PCP and trial of Zofran as needed.  Advised use of cane at all times for fall prevention  No indication for imaging or further evaluation as symptoms consistent with prior  episode of BPPV and unable  to appreciate focal deficits on today's exam -this was further discussed with Dr. Jaynee Eagles who agrees with plan  He does have prior stroke history with chronic gait impairment and imbalance  Discussed importance of adequate water intake as well as nutrition as this could be contributing to "constant" dizziness complaint.  Advised him to further follow-up with PCP for further evaluation in this aspect    Follow-up as needed    Cc: [facility] provider: Dr. Boykin Reaper, Annie Main, MD   I spent 30 minutes of face-to-face and non-face-to-face time with patient.  This included previsit chart review, lab review, study review, order entry, electronic health record documentation, patient education and discussion regarding worsening dizziness/vertigo and likely etiologies, vestibular rehab and further interventions and answered all other questions to patient satisfaction  [name], [facility]  [facility] [address] [place], [place] [address]  Phone [phone] Fax [phone] Note: This document was prepared with digital dictation and possible smart phrase technology. Any transcriptional errors that result from this process are unintentional.  Made any corrections needed, and agree with history, physical, neuro exam,assessment and plan as stated.     Sarina Ill, MD [facility]

## 2020-10-12 ENCOUNTER — Telehealth: Payer: Self-pay | Admitting: Adult Health

## 2020-10-12 NOTE — Telephone Encounter (Signed)
I think vestibular rehab is the best option thanks

## 2020-10-12 NOTE — Telephone Encounter (Signed)
Pt called, ondansetron (ZOFRAN ODT) 4 MG disintegrating tablet is not working. Would like a call from the nurse.

## 2020-10-12 NOTE — Telephone Encounter (Signed)
His symptoms are likely related to BPPV which is best treated by vestibular rehab.  Hopefully he will start to see progress after working with rehab but I will forward this to Dr. Jaynee Eagles to see if she has any other recommendations

## 2020-10-12 NOTE — Telephone Encounter (Signed)
I called pt.  He states he has been taking the meclizine and ondansetron every  6-8 hours since last seen, has 3 tabs let.  Dizziness and nausea same, no change.  Has vestibulary rehab next door tomorrow.  I told him that will relay and see what recommendations she has for him and call him back.

## 2020-10-12 NOTE — Telephone Encounter (Signed)
Spoke to pt and relayed the vest rehab the best option to treat BPPV at this time, no other med recs at this time unless if Dr. Jaynee Eagles had any other receommendations.  He appreciated call back.

## 2020-10-13 ENCOUNTER — Ambulatory Visit: Payer: BLUE CROSS/BLUE SHIELD | Attending: Adult Health

## 2020-10-13 ENCOUNTER — Other Ambulatory Visit: Payer: Self-pay

## 2020-10-13 DIAGNOSIS — R2689 Other abnormalities of gait and mobility: Secondary | ICD-10-CM

## 2020-10-13 DIAGNOSIS — R262 Difficulty in walking, not elsewhere classified: Secondary | ICD-10-CM | POA: Diagnosis present

## 2020-10-13 DIAGNOSIS — R2681 Unsteadiness on feet: Secondary | ICD-10-CM | POA: Diagnosis not present

## 2020-10-13 DIAGNOSIS — R42 Dizziness and giddiness: Secondary | ICD-10-CM | POA: Diagnosis present

## 2020-10-13 NOTE — Telephone Encounter (Signed)
Pt returned call. Please call back when available. 

## 2020-10-13 NOTE — Telephone Encounter (Signed)
I relayed to pt that no other recommendations best option Vest rehab.  He appreciated call.

## 2020-10-13 NOTE — Telephone Encounter (Addendum)
Pt has appt next door today at 1500.  Per Dr. Jaynee Eagles, vestibular rehab best option at this time.

## 2020-10-13 NOTE — Telephone Encounter (Signed)
Called pt and no answer.  

## 2020-10-13 NOTE — Therapy (Signed)
George Franco [address] [place], [place], [address] Phone: [phone]   Fax:  [phone]  Physical Therapy Evaluation  Patient Details  Name: George Franco MRN: [id number] Date of Birth: [birth year] Referring Provider (PT): [name], NP   Encounter Date: 10/13/2024   PT End of Session - 10/13/20 1613    Visit Number 1    Number of Visits 1    Date for PT Re-Evaluation --    Authorization Type BCBS (OON)    PT Start Time 3419    PT Stop Time 1530    PT Time Calculation (min) 43 min    Activity Tolerance Patient tolerated treatment well    Behavior During Therapy [facility] for tasks assessed/performed           Past Medical History:  Diagnosis Date  . At risk for sleep apnea    STOP-BANG= 5     SENT TO PCP 06-02-2015  . BPH (benign prostatic hyperplasia)   . CTS (carpal tunnel syndrome)   . CTS (carpal tunnel syndrome)   . ED (erectile dysfunction) of organic origin   . Hyperlipidemia   . Hypertension   . Hypogonadism in male   . Knee pain    pt said he doesn't know anything about this  . Lower urinary tract symptoms (LUTS)   . LVH (left ventricular hypertrophy)   . Persistent vertigo of central origin   . Prostate cancer [facility]) urologist-  dr ottelin/  oncologist-  [name]   Stage T1c, Gleason 4+3, PSA 4.48,  vol 32.9cc--  External RXT complete 05-08-2015  . Stroke ([place])   . Type 2 diabetes mellitus ([place])   . Vestibular neuropathy   . Wears glasses     Past Surgical History:  Procedure Laterality Date  . COLONOSCOPY    . IR ANGIOGRAM EXTREMITY RIGHT  03/05/2020  . IR FEM POP ART PTA MOD SED  03/05/2020  . IR RADIOLOGIST EVAL & MGMT  02/26/2020  . IR RADIOLOGIST EVAL & MGMT  04/15/2020  . IR RADIOLOGIST EVAL & MGMT  07/22/2020  . IR TIB-PERO ART ATHEREC INC PTA MOD SED  03/05/2020  . IR US GUIDE VASC ACCESS RIGHT  03/05/2020  . PROSTATE BIOPSY    . RADIOACTIVE SEED IMPLANT N/A 06/05/2015    Procedure: RADIOACTIVE SEED IMPLANT/BRACHYTHERAPY IMPLANT;  Surgeon: [name], MD;  Location: [facility];  Service: Urology;  Laterality: N/A;  . ROTATOR CUFF REPAIR Right 2014  . UMBILICAL HERNIA REPAIR  1990's  . UPPER GASTROINTESTINAL ENDOSCOPY    . wisdom teeth extrat      There were no vitals filed for this visit.    Subjective Assessment - 10/13/20 1539    Subjective Patient reports that he feels imbalanced and have some dizziness. Patient reports that in the last three months it has gradually gotten worse over the last three months. No falls. Patient is currently ambulating with a SPC.  Patient reports feels most dizziness/imblance when up and moving, not as much laying back in bed. Denies spinning sensation. Paitent changes in hearing and vision. Paitent reports noticed vision changes but was prior ot current episode. patient reports more difficulty getting in and around the home.    Pertinent History Type 2 DM, CVA, Prostate Cancer, HTN, Hyperlipidemia, CTS    Limitations Standing;Walking    Patient Stated Goals help resolve the dizziness    Currently in Pain? No/denies  [facility] PT Assessment - 10/13/20 0001      Assessment   Medical Diagnosis Vertigo/BPPV    Referring Provider (PT) [name], NP    Onset Date/Surgical Date 09/30/20    Hand Dominance Right    Prior Therapy None      Precautions   Precautions Fall      Restrictions   Weight Bearing Restrictions No      Balance Screen   Has the patient fallen in the past 6 months No    Has the patient had a decrease in activity level because of a fear of falling?  Yes    Is the patient reluctant to leave their home because of a fear of falling?  Yes   does not leave home by himself     [place] residence    Living Arrangements Spouse/significant other    Available Help at Discharge Family    Type of [place] to enter     Entrance Stairs-Number of Steps 3    Entrance Stairs-Rails [place] One level    [place] - single point      Prior Function   Level of Independence Independent with household mobility with device    Vocation Retired    Leisure Engineering geologist to [employer]   Overall Cognitive Status Within Functional Limits for tasks assessed      Sensation   Light Touch Impaired by gross assessment    Additional Comments neuropathy due to DM      Transfers   Transfers Sit to Stand;Stand to Sit    Sit to Stand 4: Min guard    Stand to Sit 4: Min guard    Comments patient very unsteady upon standing, often hesistant to let go of chair      Ambulation/Gait   Ambulation/Gait Yes    Ambulation/Gait Assistance 5: Supervision;4: Min guard    Ambulation/Gait Assistance Details patient ambulating into therapy session with very wide BOS often holding SPC in hand and then using wall for support. One instance of imbalance requiring CGA from PT. Shuffled steps noted at times. PT educating on proper use of SPC.    Ambulation Distance (Feet) 100 Feet    Assistive device Straight cane    Gait Pattern Step-through pattern;Shuffle;Wide base of support;Poor foot clearance - left;Poor foot clearance - right    Ambulation Surface Level;Indoor      High Level Balance   High Level Balance Comments able to stand with wide BOS and eyes open: 30 seconds increased sway noted. attempted to complete wide BOS with eyes closed on firm surface but unable to complete due to imbalance. patient requesting to hold onto AD.                  Vestibular Assessment - 10/13/20 0001      Symptom Behavior   Subjective history of current problem see subjective. more feelings of imbalanced. denies vision/hearing changes. reports that the dizziness gets worse as the day goes on, feels the best when he wakes in the morning.    Type of Dizziness  Unsteady with head/body  turns;Lightheadedness;Imbalance    Frequency of Dizziness daily    Duration of Dizziness hours    Symptom Nature Motion provoked;Constant    Aggravating Factors Comment;Activity in general   reports smoking makes it worse   Relieving Factors Slow movements;Rest  Progression of Symptoms Worse      Oculomotor Exam   Oculomotor Alignment Abnormal    Spontaneous Absent    Gaze-induced  Absent    Smooth Pursuits Saccades    Saccades Hypometric;Undershoots   corrects quickly     Vestibulo-Ocular Reflex   VOR 1 Head Only (x 1 viewing) difficulty concentrating; mild dizziness    VOR Cancellation Normal      Other Tests   Comments Completed VBI: negative bilaterally      Positional Testing   Dix-Hallpike Dix-Hallpike Right;Dix-Hallpike Left    Sidelying Test Sidelying Right;Sidelying Left    Horizontal Canal Testing Horizontal Canal Right;Horizontal Canal Left      Dix-Hallpike Right   Dix-Hallpike Right Duration 0    Dix-Hallpike Right Symptoms No nystagmus      Dix-Hallpike Left   Dix-Hallpike Left Duration 0    Dix-Hallpike Left Symptoms No nystagmus      Sidelying Right   Sidelying Right Duration 0    Sidelying Right Symptoms No nystagmus      Sidelying Left   Sidelying Left Duration 0    Sidelying Left Symptoms No nystagmus      Horizontal Canal Right   Horizontal Canal Right Duration 0    Horizontal Canal Right Symptoms Normal      Horizontal Canal Left   Horizontal Canal Left Duration 0    Horizontal Canal Left Symptoms Normal      Positional Sensitivities   Sit to Supine No dizziness    Supine to Left Side No dizziness    Supine to Right Side No dizziness    Supine to Sitting Mild dizziness    Right Hallpike No dizziness    Up from Right Hallpike Lightheadedness    Up from Left Hallpike Lightheadedness    Nose to Right Knee No dizziness    Right Knee to Sitting No dizziness    Nose to Left Knee No dizziness    Left Knee to Sitting No dizziness    Head  Turning x 5 Mild dizziness    Head Nodding x 5 Mild dizziness    Pivot Right in Standing Mild dizziness    Pivot Left in Standing Mild dizziness    Rolling Right No dizziness    Rolling Left No dizziness              Objective measurements completed on examination: See above findings.               PT Education - 10/13/20 1543    Education Details educated on POC/Evaluation Findings. Insurance OON. Educated on Consulting civil engineer for Home    Person(s) Educated Patient    Methods Explanation    Comprehension Verbalized understanding               Plan - 10/13/20 1730    Clinical Impression Statement Patient is a 65 y.o. male that was referred to Neuro OPPT services for Vertigo. Patient's PMH is significnat for the following: Type 2 DM, CVA, Prostate Cancer, HTN, Hyperlipidemia, CTS. Upon evaluation patient presents with impaired sensation, decreased balance, abnormal gait, dizziness, and increased fall risk. Patient demosntrating no signs of BPPV with assesment. Paitent demonstrating increased motion sensitivity with MSQ, as well as impaired balance due to neuropathy and decreased vestibular input, and abnormal gait.  No follow up visits scheudled at this time per patient request due to insurance being [place]. Patient will benefit from skilled PT services to address impairments listed above and reduce risk  for falls.    Personal Factors and Comorbidities Comorbidity 3+;Time since onset of injury/illness/exacerbation    Comorbidities Type 2 DM, CVA, Prostate Cancer, HTN, Hyperlipidemia, CTS    Examination-Activity Limitations Bed Mobility;Transfers;Locomotion Level;Sit;Stairs;Stand    Examination-Participation Restrictions Community Activity;Driving;Yard Work    Merchant navy officer Evolving/Moderate complexity    Clinical Decision Making Moderate    Consulted and Agree with Plan of Care Patient           Patient will benefit from skilled therapeutic  intervention in order to improve the following deficits and impairments:  Abnormal gait,Difficulty walking,Decreased balance,Decreased activity tolerance,Dizziness,Decreased knowledge of precautions,Impaired sensation  Visit Diagnosis: Unsteadiness on feet  Other abnormalities of gait and mobility  Dizziness and giddiness  Difficulty in walking, not elsewhere classified     Problem List Patient Active Problem List   Diagnosis Date Noted  . Lacunar stroke ([place]) 06/05/2019  . BPPV (benign paroxysmal positional vertigo) 05/13/2019  . Abnormal CT scan of lung - RLL ground glass density 01/26/2018  . Malignant neoplasm of prostate ([place]) 01/26/2015  . Dizziness and giddiness 11/18/2014  . Essential hypertension, benign 11/18/2014    Jones Bales, PT, DPT 10/13/2020, 5:36 PM  [facility] [address] [place], [place], [address] Phone: [phone]   Fax:  [phone]  Name: Caroll Cunnington MRN: [id number] Date of Birth: 09-20-1955

## 2020-11-11 ENCOUNTER — Other Ambulatory Visit: Payer: Self-pay

## 2020-11-11 ENCOUNTER — Ambulatory Visit (INDEPENDENT_AMBULATORY_CARE_PROVIDER_SITE_OTHER): Payer: BLUE CROSS/BLUE SHIELD | Admitting: Podiatry

## 2020-11-11 DIAGNOSIS — M79675 Pain in left toe(s): Secondary | ICD-10-CM | POA: Diagnosis not present

## 2020-11-11 DIAGNOSIS — E1142 Type 2 diabetes mellitus with diabetic polyneuropathy: Secondary | ICD-10-CM | POA: Diagnosis not present

## 2020-11-11 DIAGNOSIS — M79674 Pain in right toe(s): Secondary | ICD-10-CM

## 2020-11-11 DIAGNOSIS — B351 Tinea unguium: Secondary | ICD-10-CM | POA: Diagnosis not present

## 2020-11-12 ENCOUNTER — Encounter: Payer: Self-pay | Admitting: Podiatry

## 2020-11-12 NOTE — Progress Notes (Signed)
  Subjective:  Patient ID: George Franco, male    DOB: 01/29/1956,  MRN: [id number]  Chief Complaint  Patient presents with  . Wound Check    Nail trim    65 y.o. male returns for the above complaint.  Patient presents with thickened elongated dystrophic toenails x10.  Patient is a diabetic with last A1c that is unknown.  He states he is doing well.  He denies any other acute complaints he would like to have the nails trimmed down his not able to do it himself.  Objective:  There were no vitals filed for this visit. Podiatric Exam: Vascular: dorsalis pedis and posterior tibial pulses are palpable bilateral. Capillary return is immediate. Temperature gradient is WNL. Skin turgor WNL  Sensorium: Normal Semmes Weinstein monofilament test. Normal tactile sensation bilaterally. Nail Exam: Pt has thick disfigured discolored nails with subungual debris noted bilateral entire nail hallux through fifth toenails.  Pain on palpation to the nails. Ulcer Exam: There is no evidence of ulcer or pre-ulcerative changes or infection. Orthopedic Exam: Muscle tone and strength are WNL. No limitations in general ROM. No crepitus or effusions noted. HAV  B/L.  Hammer toes 2-5  B/L. Skin: No Porokeratosis. No infection or ulcers    Assessment & Plan:   1. Diabetic peripheral neuropathy associated with type 2 diabetes mellitus (HCC)   2. Pain due to onychomycosis of toenails of both feet     Patient was evaluated and treated and all questions answered.  Onychomycosis with pain  -Nails palliatively debrided as below. -Educated on self-care  Procedure: Nail Debridement Rationale: pain  Type of Debridement: manual, sharp debridement. Instrumentation: Nail nipper, rotary burr. Number of Nails: 10  Procedures and Treatment: Consent by patient was obtained for treatment procedures. The patient understood the discussion of treatment and procedures well. All questions were answered thoroughly reviewed.  Debridement of mycotic and hypertrophic toenails, 1 through 5 bilateral and clearing of subungual debris. No ulceration, no infection noted.  Return Visit-Office Procedure: Patient instructed to return to the office for a follow up visit 3 months for continued evaluation and treatment.  Boneta Lucks, DPM    No follow-ups on file.

## 2020-11-30 DIAGNOSIS — Z0271 Encounter for disability determination: Secondary | ICD-10-CM

## 2020-12-09 ENCOUNTER — Ambulatory Visit (INDEPENDENT_AMBULATORY_CARE_PROVIDER_SITE_OTHER): Payer: BLUE CROSS/BLUE SHIELD | Admitting: Podiatry

## 2020-12-09 ENCOUNTER — Other Ambulatory Visit: Payer: Self-pay

## 2020-12-09 DIAGNOSIS — L603 Nail dystrophy: Secondary | ICD-10-CM

## 2020-12-09 DIAGNOSIS — E1142 Type 2 diabetes mellitus with diabetic polyneuropathy: Secondary | ICD-10-CM

## 2020-12-11 ENCOUNTER — Encounter: Payer: Self-pay | Admitting: Podiatry

## 2020-12-11 NOTE — Progress Notes (Signed)
Subjective:  Patient ID: George Franco, male    DOB: [birth year],  MRN: [id number]  Chief Complaint  Patient presents with  . Wound Check    65 y.o. male presents with the above complaint.  Patient presents with complaint of left dystrophic nail that is causing him some pain leading to an ingrown.  Patient states is painful to touch.  He does not want to do official ingrown nail.  He would like for me to doing a slant back procedure.  He denies any other acute complaints.  No clinical signs of infection.  He is a diabetic.   Review of Systems: Negative except as noted in the HPI. Denies N/V/F/Ch.  Past Medical History:  Diagnosis Date  . At risk for sleep apnea    STOP-BANG= 5     SENT TO PCP 06-02-2015  . BPH (benign prostatic hyperplasia)   . CTS (carpal tunnel syndrome)   . CTS (carpal tunnel syndrome)   . ED (erectile dysfunction) of organic origin   . Hyperlipidemia   . Hypertension   . Hypogonadism in male   . Knee pain    pt said he doesn't know anything about this  . Lower urinary tract symptoms (LUTS)   . LVH (left ventricular hypertrophy)   . Persistent vertigo of central origin   . Prostate cancer [facility]) urologist-  dr ottelin/  oncologist-  [name]   Stage T1c, Gleason 4+3, PSA 4.48,  vol 32.9cc--  External RXT complete 05-08-2015  . Stroke ([place])   . Type 2 diabetes mellitus ([place])   . Vestibular neuropathy   . Wears glasses     Current Outpatient Medications:  .  aspirin EC 81 MG tablet, Take 1 tablet (81 mg total) by mouth daily., Disp: 30 tablet, Rfl: 0 .  benazepril (LOTENSIN) 40 MG tablet, Take 40 mg by mouth daily., Disp: , Rfl:  .  clopidogrel (PLAVIX) 75 MG tablet, Take 1 tablet (75 mg total) by mouth daily. (Patient not taking: Reported on 10/13/2020), Disp: 30 tablet, Rfl: 2 .  doxycycline (VIBRA-TABS) 100 MG tablet, Take 100 mg by mouth 2 (two) times daily., Disp: , Rfl:  .  doxycycline (VIBRA-TABS) 100 MG tablet, Take 1 tablet (100 mg total) by mouth 2  (two) times daily., Disp: 28 tablet, Rfl: 0 .  ezetimibe (ZETIA) 10 MG tablet, Take 10 mg by mouth daily., Disp: , Rfl:  .  FARXIGA 10 MG TABS tablet, Take 10 mg by mouth daily., Disp: , Rfl:  .  Insulin Degludec 100 UNIT/ML SOLN, Inject 20 Units into the skin daily., Disp: , Rfl:  .  loperamide (IMODIUM) 2 MG capsule, Take 2 mg by mouth every 6 (six) hours as needed for constipation. (Patient not taking: Reported on 10/13/2020), Disp: , Rfl: 0 .  meclizine (ANTIVERT) 25 MG tablet, Take 1 tablet (25 mg total) by mouth 3 (three) times daily as needed for dizziness., Disp: 30 tablet, Rfl: 2 .  metoCLOPramide (REGLAN) 10 MG tablet, Take 1 tablet (10 mg total) by mouth every 6 (six) hours., Disp: 30 tablet, Rfl: 0 .  mupirocin ointment (BACTROBAN) 2 %, APPLY TO TOE TWICE DAILY, Disp: , Rfl:  .  NOVOLOG FLEXPEN 100 UNIT/ML FlexPen, Inject 20 Units into the skin 3 (three) times daily., Disp: , Rfl: 6 .  ondansetron (ZOFRAN ODT) 4 MG disintegrating tablet, Take 1 tablet (4 mg total) by mouth every 8 (eight) hours as needed for nausea or vomiting (vertigo)., Disp: 20 tablet, Rfl: 0 .  ONETOUCH VERIO test strip, USE TO TEST BLOOD SUGAR 3 TIMES A DAY PRIOR TO MEALS AND RECORD, Disp: , Rfl:  .  pantoprazole (PROTONIX) 40 MG tablet, Take 1 tablet (40 mg total) by mouth daily., Disp: 90 tablet, Rfl: 3 .  rosuvastatin (CRESTOR) 20 MG tablet, Take 20 mg by mouth daily., Disp: , Rfl:  .  zolpidem (AMBIEN) 5 MG tablet, Take 5 mg by mouth at bedtime., Disp: , Rfl:   Current Facility-Administered Medications:  .  0.9 %  sodium chloride infusion, 500 mL, Intravenous, Once, Jackquline Denmark, MD  Social History   Tobacco Use  Smoking Status Current Some Day Smoker  . Packs/day: 0.50  . Years: 20.00  . Pack years: 10.00  . Types: Cigarettes  Smokeless Tobacco Never Used    No Known Allergies Objective:  There were no vitals filed for this visit. There is no height or weight on file to calculate  BMI. Constitutional Well developed. Well nourished.  Vascular Dorsalis pedis pulses palpable bilaterally. Posterior tibial pulses palpable bilaterally. Capillary refill normal to all digits.  No cyanosis or clubbing noted. Pedal hair growth normal.  Neurologic Normal speech. Oriented to person, place, and time. Epicritic sensation to light touch grossly present bilaterally.  Dermatologic  pain on palpation of the left hallux ingrown nail borders.  No clinical signs of infection noted.  No purulent drainage was expressed.  No ulcers noted.  Orthopedic: Normal joint ROM without pain or crepitus bilaterally. No visible deformities. No bony tenderness.   Radiographs: None Assessment:   1. Diabetic peripheral neuropathy associated with type 2 diabetes mellitus ([place])   2. Nail dystrophy    Plan:  Patient was evaluated and treated and all questions answered.  Left hallux ingrown with underlying nail dystrophy -I explained to the patient the etiology of no dystrophy's and leading his self to an ingrown.  I discussed with him that he may benefit from ingrown nail procedure to give him some relief however patient was hesitant and does not want to undergo a nail procedure.  At this time I offered him to do a slant back procedure to try to take care of the nail to give him temporary relief.  He states understanding and would like proceed with that. -If the ingrown continues to get worse or gets infected we will discuss removing it.  Patient states understanding  No follow-ups on file.

## 2020-12-30 ENCOUNTER — Other Ambulatory Visit: Payer: Self-pay

## 2020-12-30 ENCOUNTER — Encounter: Payer: Self-pay | Admitting: Neurology

## 2020-12-30 ENCOUNTER — Ambulatory Visit (INDEPENDENT_AMBULATORY_CARE_PROVIDER_SITE_OTHER): Payer: No Typology Code available for payment source | Admitting: Neurology

## 2020-12-30 VITALS — BP 160/74 | HR 87 | Ht 69.0 in | Wt 190.0 lb

## 2020-12-30 DIAGNOSIS — R269 Unspecified abnormalities of gait and mobility: Secondary | ICD-10-CM | POA: Diagnosis not present

## 2020-12-30 DIAGNOSIS — R42 Dizziness and giddiness: Secondary | ICD-10-CM | POA: Diagnosis not present

## 2020-12-30 DIAGNOSIS — R2689 Other abnormalities of gait and mobility: Secondary | ICD-10-CM | POA: Diagnosis not present

## 2020-12-30 DIAGNOSIS — H9191 Unspecified hearing loss, right ear: Secondary | ICD-10-CM | POA: Diagnosis not present

## 2020-12-30 MED ORDER — MECLIZINE HCL 25 MG PO TABS: 25.0000 mg | ORAL_TABLET | Freq: Three times a day (TID) | ORAL | 2 refills | Status: AC | PRN

## 2020-12-30 NOTE — Progress Notes (Signed)
[facility]    Provider:  Dr Jaynee Eagles Requesting Provider: [name], MD Primary Care Provider:  [name], MD    CC: Worsening dizziness and vertigo  12/30/2020: patient here for dizziness, was referred to vestibular PT but only went once and declined any further appointments despite recommendations that this may help. He is back for follow up. He says vertigo has been terrible. It never stops. Feelings of motion and dizziness when standing or walking. If he looks up he gets dizzy, turn head either way he feels dizzy, feels like motion when there is no motion, happens even when sitting.  Not recently to ENT, decreased hearing in the right ear. He has imbalance with walking as well.   Today, 09/30/2020, George Franco is being seen for acute visit. He was previously seen by Dr. Jaynee Eagles in 05/2019 for vertigo type episodes and stable since that time but reports 2-week onset of dizziness greatly interfering with ambulation. Evaluated by PCP who prescribed meclizine and advised to follow-up with our office for further evaluation.  Reports dizziness has been constant present upon awakening and worsening throughout the day.  He also reports worsening when turning head quickly from side to side or when he stands from sitting for prolonged period.  Denies symptoms while sitting.  He has been using a cane which he was using previously and denies any recent falls.  Use of meclizine without benefit.  He will occasionally have difficulty focusing with both eyes open but denies blurred or double vision.  He does admit to having a prescription for glasses but lost them and has not had recent follow-up with ophthalmology.  Denies weakness, numbness/tingling, speech or language difficulties or headache.  Towards the end of visit, he also reports diminished appetite and minimal to no fluid intake throughout the day  Recent lab work by PCP personally reviewed which was all  satisfactory    History provided for reference purposes only Update 06/05/2019 Dr. Jaynee Eagles: today patient comes in for follow up, multiple lacunar strokes in the right pons, discussed lacunar strokes, causes, he smells heavily of cigarette smoke and we discussed smoking as a big risk factor, cholesterol, diabetes. We reviewed all the images togather, showed him the microvascular chronic changes. He needs close follow up and strict management of vascular risk factors. Will complete stroke workup with CTA head and echocardiogram.   Initial visit 05/13/2019 Dr. Jaynee Eagles:  George Franco is a 65 y.o. male here as requested by [name], MD for vertigo.  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 Vertigo started several years ago, it is episodic, it hits him "all of a sudden", he feels dizzy and feels like he is going to fall. He is going to have another MRI 05/28/2019 ordered by Dr. Forde Dandy. Vertigo lasts all day, if he turn to the left the vertigo worsens, he says the episodes come and go and he started having the symptoms sine 8am. If he turns his head he can make the symptoms worse. He was shown the Epley Maneuvers by ENT. Never been to vestibular therapy. No vomiting. No weakness or vision changes or any other focal deficit. Sitting still helps the vertigo. No inciting events, he has several episodes a year lasting 1-2 days. He sometimes has headaches, frontal, dull not migrainous. Lack of sleep worsens.  Melatonin helps him sleep. No other focal neurologic deficits, associated symptoms, inciting events or modifiable factors.  Reviewed notes, labs and imaging from outside physicians, which showed: 03/25/2019: Ct w/wo showed No acute intracranial abnormalities including mass lesion or mass effect,  hydrocephalus, extra-axial fluid collection, midline shift, hemorrhage, or acute infarction, large ischemic events (personally reviewed images)  Reviewed MRI brain report 11/2014: FINDINGS: Major intracranial vascular flow voids are stable. Cerebral volume is normal. No restricted diffusion to suggest acute infarction. No midline shift, mass effect, evidence of mass lesion, ventriculomegaly, extra-axial collection or acute intracranial hemorrhage. Cervicomedullary junction and pituitary are within normal limits. Negative visualized cervical spine.  Cbc normal, cmp with elevated glucose otherwise unremarkable  Pearline Cables and white matter signal throughout the brain appears within normal limits for age and not changed since 2007. No cortical encephalomalacia or chronic blood products identified.  Diminutive appearance of both IAC which is chronic. Other visible internal auditory structures appear grossly normal in the mastoids are clear.  Negative paranasal sinuses. Visualized orbit soft tissues are within normal limits. Normal bone marrow signal. Visualized scalp soft tissues are within normal limits.  IMPRESSION: 1. Stable and essentially normal for age noncontrast MRI appearance of the brain. 2. Chronic diminutive appearance of the bony internal auditory canals. Is there any evidence of vestibulocochlear neuropathy, or any other cranial neuropathies to suggest entrapment such as due to hyperostosis crani  alis interna?  I reviewed Dr. Baldwin Crown notes.  Patient presented for short-term disability and restrictions form to be filled out.  Dizziness had improved "a little bit" at last appointment.  He had been offered neurology referral in the past but declined but at the last appointment wanted to see neurology.  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  No vision changes.  I reviewed examination which showed slightly elevated blood pressure at 155/74, otherwise physical exam was normal, he did complain of vertigo, dizziness and gait instability.   Review of Systems: Patient complains of symptoms per HPI as well as the following symptoms: dizziness . Pertinent negatives and positives per HPI. All others negative    Social History   Socioeconomic History  . Marital status: Married    Spouse name: Not on file  . Number of children: 3  . Years of education: Not on file  . Highest education level: Not on file  Occupational History  . Occupation: Glass blower/designer    Employer: [employer]  Tobacco Use  . Smoking status: Current Some Day Smoker    Packs/day: 0.50    Years: 20.00    Pack years: 10.00    Types: Cigarettes  . Smokeless tobacco: Never Used  Vaping Use  . Vaping Use: Never used  Substance and Sexual Activity  . Alcohol use: Yes    Alcohol/week: 0.0 standard drinks    Comment: occasional  . Drug use: No  . Sexual activity: Yes    Partners: Female  Other Topics Concern  . Not on file  Social History Narrative   Married, 2 sons one daughter   Retired    Caffeine: none    0-1 alcoholic drinks/week   He is a smoker   No drug use   Lives at home with wife   Social Determinants of Health   Financial Resource Strain:  Not on file  Food Insecurity: Not on file  Transportation Needs: Not on file  Physical Activity: Not on file  Stress: Not on file  Social Connections: Not on file  Intimate Partner Violence: Not on file    Family History  Problem Relation Age of Onset  . Prostate cancer Father   . Diabetes Father   . Diabetes Mother   . Diabetes Brother   . Colon cancer Neg Hx   . Esophageal cancer Neg Hx   . Liver cancer Neg Hx   . Pancreatic cancer Neg Hx   . Rectal cancer Neg Hx   . Stomach cancer Neg Hx     Past Medical History:  Diagnosis Date   . At risk for sleep apnea    STOP-BANG= 5     SENT TO PCP 06-02-2015  . BPH (benign prostatic hyperplasia)   . CTS (carpal tunnel syndrome)   . CTS (carpal tunnel syndrome)   . ED (erectile dysfunction) of organic origin   . Hyperlipidemia   . Hypertension   . Hypogonadism in male   . Knee pain    pt said he doesn't know anything about this  . Lower urinary tract symptoms (LUTS)   . LVH (left ventricular hypertrophy)   . Persistent vertigo of central origin   . Prostate cancer [facility]) urologist-  dr ottelin/  oncologist-  [name]   Stage T1c, Gleason 4+3, PSA 4.48,  vol 32.9cc--  External RXT complete 05-08-2015  . Stroke ([place])   . Type 2 diabetes mellitus ([place])   . Vestibular neuropathy   . Wears glasses     Patient Active Problem List   Diagnosis Date Noted  . Lacunar stroke ([place]) 06/05/2019  . BPPV (benign paroxysmal positional vertigo) 05/13/2019  . Abnormal CT scan of lung - RLL ground glass density 01/26/2018  . Malignant neoplasm of prostate ([place]) 01/26/2015  . Dizziness and giddiness 11/18/2014  . Essential hypertension, benign 11/18/2014    Past Surgical History:  Procedure Laterality Date  . COLONOSCOPY    . IR ANGIOGRAM EXTREMITY RIGHT  03/05/2020  . IR FEM POP ART PTA MOD SED  03/05/2020  . IR RADIOLOGIST EVAL & MGMT  02/26/2020  . IR RADIOLOGIST EVAL & MGMT  04/15/2020  . IR RADIOLOGIST EVAL & MGMT  07/22/2020  . IR TIB-PERO ART ATHEREC INC PTA MOD SED  03/05/2020  . IR US GUIDE VASC ACCESS RIGHT  03/05/2020  . PROSTATE BIOPSY    . RADIOACTIVE SEED IMPLANT N/A 06/05/2015   Procedure: RADIOACTIVE SEED IMPLANT/BRACHYTHERAPY IMPLANT;  Surgeon: [name], MD;  Location: [place];  Service: Urology;  Laterality: N/A;  . ROTATOR CUFF REPAIR Right 2014  . UMBILICAL HERNIA REPAIR  1990's  . UPPER GASTROINTESTINAL ENDOSCOPY    . wisdom teeth extrat      Current Outpatient Medications  Medication Sig Dispense Refill  . aspirin EC 81 MG tablet  Take 1 tablet (81 mg total) by mouth daily. 30 tablet 0  . benazepril (LOTENSIN) 40 MG tablet Take 40 mg by mouth daily.    . clopidogrel (PLAVIX) 75 MG tablet Take 1 tablet (75 mg total) by mouth daily. 30 tablet 2  . doxycycline (VIBRA-TABS) 100 MG tablet Take 100 mg by mouth 2 (two) times daily.    Marland Kitchen doxycycline (VIBRA-TABS) 100 MG tablet Take 1 tablet (100 mg total) by mouth 2 (two) times daily. 28 tablet 0  . ezetimibe (ZETIA) 10 MG tablet Take 10 mg  by mouth daily.    Marland Kitchen FARXIGA 10 MG TABS tablet Take 10 mg by mouth daily.    . Insulin Degludec 100 UNIT/ML SOLN Inject 20 Units into the skin daily.    Marland Kitchen loperamide (IMODIUM) 2 MG capsule Take 2 mg by mouth every 6 (six) hours as needed for constipation.  0  . NOVOLOG FLEXPEN 100 UNIT/ML FlexPen Inject 20 Units into the skin 3 (three) times daily.  6  . ONETOUCH VERIO test strip USE TO TEST BLOOD SUGAR 3 TIMES A DAY PRIOR TO MEALS AND RECORD    . pantoprazole (PROTONIX) 40 MG tablet Take 1 tablet (40 mg total) by mouth daily. 90 tablet 3  . rosuvastatin (CRESTOR) 20 MG tablet Take 20 mg by mouth daily.    Marland Kitchen zolpidem (AMBIEN) 5 MG tablet Take 5 mg by mouth at bedtime.    . meclizine (ANTIVERT) 25 MG tablet Take 1 tablet (25 mg total) by mouth 3 (three) times daily as needed for dizziness. 30 tablet 2   Current Facility-Administered Medications  Medication Dose Route Frequency Provider Last Rate Last Admin  . 0.9 %  sodium chloride infusion  500 mL Intravenous Once Jackquline Denmark, MD        Allergies as of 12/30/2020  . (No Known Allergies)    Vitals: Today's Vitals   12/30/20 1256  BP: (!) 160/74  Pulse: 87  Weight: 190 lb (86.2 kg)  Height: 5\' 9"  (1.753 m)   Body mass index is 28.06 kg/m.   Exam: NAD, pleasant                  Speech:    Speech is normal; fluent and spontaneous with normal comprehension.  Cognition:    The patient is oriented to person, place, and time;     recent and remote memory intact;     language  fluent;    Cranial Nerves:    The pupils are equal, round, and reactive to light.Smooth pursuit, no nystagmus. Trigeminal sensation is intact and the muscles of mastication are normal. The face is symmetric. The palate elevates in the midline. Hearing intact. Voice is normal. Shoulder shrug is normal. The tongue has normal motion without fasciculations.   Coordination:  No dysmetria  Motor Observation:    No asymmetry, no atrophy, and no involuntary movements noted. Tone:    Normal muscle tone.     Strength:    Strength is symmetrical in the upper and lower limbs.       Gait: wide based, not shuffling or ataxic    Assessment/Plan:  65 year old with chronic persistent worsening dizziness/vertigo.  Seen by [place] previously multiple times back through 2020(carotid dopplers 06/2019 normal) with imaging showing evidence of multiple pontine infarcts on imaging likely incidental in setting of small vessel disease. Exam is non focal.   Suspect not drinking enough fluids, encouraged increase in fluids.   Difficulty with full history although likely multifactorial as he reports constant dizziness worsening throughout the day but also reports increased dizziness with position changes or head movements. In the past denied dizziness at rest but today states is continuous even with sitting  Hx of BPPV with some features consistent with BPPV therefore recommended vestibular rehab as well as ongoing use of meclizine per PCP and trial of Zofran as needed.  Advised use of cane at all times for fall prevention. He went to one vestibular PT session and declined any further because of insurance per PT notes.  Will repeat MRI brain and cervical spine with thin cuts through the IAC.  Send to ENT for decreases hearing in right ear and recheck. If no other etiology then can  consider PCP send to the [facility] and we will return patient back to pcp for dizziness but we can continue to follow for  strokes.   He does have prior stroke history, diabetic polyneuropathy with chronic gait impairment and imbalance but will check MRI cervical spine  Discussed importance of adequate water intake as well as nutrition as this could be contributing to "constant" dizziness complaint.  Advised him to further follow-up with PCP for further evaluation in this aspect  Follow-up 6 months with Janett Billow for strokes  Personally examined patient and images,  personally obtained the history, evaluated lab date, reviewed imaging studies and agree with radiology interpretations.    Sarina Ill, MD   I spent 40 minutes of face-to-face and non-face-to-face time with patient on the  1. Vertigo   2. Imbalance   3. Gait abnormality   4. Hearing loss of right ear, unspecified hearing loss type   5. Dizziness    diagnosis.  This included previsit chart review, lab review, study review, order entry, electronic health record documentation, patient education on the different diagnostic and therapeutic options, counseling and coordination of care, risks and benefits of management, compliance, or risk factor reduction

## 2020-12-30 NOTE — Patient Instructions (Signed)
MRI of the brain and cervical spine Blood work today ENT referral for hearing loss and vertigo/dizziness Possible [facility] Meclizine as needed  Dizziness Dizziness is a common problem. It is a feeling of unsteadiness or light-headedness. You may feel like you are about to faint. Dizziness can lead to injury if you stumble or fall. Anyone can become dizzy, but dizziness is more common in older adults. This condition can be caused by a number of things, including medicines, dehydration, or illness. Follow these instructions at home: Eating and drinking  Drink enough fluid to keep your urine clear or pale yellow. This helps to keep you from becoming dehydrated. Try to drink more clear fluids, such as water.  Do not drink alcohol.  Limit your caffeine intake if told to do so by your health care provider. Check ingredients and nutrition facts to see if a food or beverage contains caffeine.  Limit your salt (sodium) intake if told to do so by your health care provider. Check ingredients and nutrition facts to see if a food or beverage contains sodium. Activity  Avoid making quick movements. ? Rise slowly from chairs and steady yourself until you feel okay. ? In the morning, first sit up on the side of the bed. When you feel okay, stand slowly while you hold onto something until you know that your balance is fine.  If you need to stand in one place for a long time, move your legs often. Tighten and relax the muscles in your legs while you are standing.  Do not drive or use heavy machinery if you feel dizzy.  Avoid bending down if you feel dizzy. Place items in your home so that they are easy for you to reach without leaning over. Lifestyle  Do not use any products that contain nicotine or tobacco, such as cigarettes and e-cigarettes. If you need help quitting, ask your health care provider.  Try to reduce your stress level by using methods such as yoga or meditation. Talk with  your health care provider if you need help to manage your stress. General instructions  Watch your dizziness for any changes.  Take over-the-counter and prescription medicines only as told by your health care provider. Talk with your health care provider if you think that your dizziness is caused by a medicine that you are taking.  Tell a friend or a family member that you are feeling dizzy. If he or she notices any changes in your behavior, have this person call your health care provider.  Keep all follow-up visits as told by your health care provider. This is important. Contact a health care provider if:  Your dizziness does not go away.  Your dizziness or light-headedness gets worse.  You feel nauseous.  You have reduced hearing.  You have new symptoms.  You are unsteady on your feet or you feel like the room is spinning. Get help right away if:  You vomit or have diarrhea and are unable to eat or drink anything.  You have problems talking, walking, swallowing, or using your arms, hands, or legs.  You feel generally weak.  You are not thinking clearly or you have trouble forming sentences. It may take a friend or family member to notice this.  You have chest pain, abdominal pain, shortness of breath, or sweating.  Your vision changes.  You have any bleeding.  You have a severe headache.  You have neck pain or a stiff neck.  You have a fever.  These symptoms may represent a serious problem that is an emergency. Do not wait to see if the symptoms will go away. Get medical help right away. Call your local emergency services (911 in the [place] Do not drive yourself to the hospital. Summary  Dizziness is a feeling of unsteadiness or light-headedness. This condition can be caused by a number of things, including medicines, dehydration, or illness.  Anyone can become dizzy, but dizziness is more common in older adults.  Drink enough fluid to keep your urine clear or pale  yellow. Do not drink alcohol.  Avoid making quick movements if you feel dizzy. Monitor your dizziness for any changes. This information is not intended to replace advice given to you by your health care provider. Make sure you discuss any questions you have with your health care provider. Document Revised: 07/28/2017 Document Reviewed: 08/27/2016 Elsevier Patient Education  2021 [employer].

## 2020-12-31 ENCOUNTER — Telehealth: Payer: Self-pay | Admitting: *Deleted

## 2020-12-31 LAB — BASIC METABOLIC PANEL
BUN/Creatinine Ratio: 15 (ref 10–24)
BUN: 13 mg/dL (ref 8–27)
CO2: 26 mmol/L (ref 20–29)
Calcium: 9.7 mg/dL (ref 8.6–10.2)
Chloride: 101 mmol/L (ref 96–106)
Creatinine, Ser: 0.84 mg/dL (ref 0.76–1.27)
Glucose: 67 mg/dL (ref 65–99)
Potassium: 4.4 mmol/L (ref 3.5–5.2)
Sodium: 140 mmol/L (ref 134–144)
eGFR: 98 mL/min/{1.73_m2} (ref >59)

## 2020-12-31 NOTE — Telephone Encounter (Addendum)
Called pt, couldn't reach him. VM full.   If he calls back, please let him know his labs are normal.

## 2020-12-31 NOTE — Telephone Encounter (Signed)
-----   Message from Melvenia Beam, MD sent at 12/31/2020  7:32 AM EDT ----- Labs normal thanks

## 2021-01-06 ENCOUNTER — Other Ambulatory Visit: Payer: Self-pay

## 2021-01-06 ENCOUNTER — Ambulatory Visit (INDEPENDENT_AMBULATORY_CARE_PROVIDER_SITE_OTHER): Payer: BLUE CROSS/BLUE SHIELD | Admitting: Podiatry

## 2021-01-06 DIAGNOSIS — L603 Nail dystrophy: Secondary | ICD-10-CM | POA: Diagnosis not present

## 2021-01-06 DIAGNOSIS — E1142 Type 2 diabetes mellitus with diabetic polyneuropathy: Secondary | ICD-10-CM | POA: Diagnosis not present

## 2021-01-06 NOTE — Telephone Encounter (Signed)
Pt's VM is still full. Called pt's wife, Forbes Cellar (on [place]) but could not reach her and there was no VM option.   If either patient or wife call back, please tell them his labs are normal.

## 2021-01-12 ENCOUNTER — Encounter: Payer: Self-pay | Admitting: Podiatry

## 2021-01-12 NOTE — Telephone Encounter (Signed)
Spoke with patient and advised his labs are normal.  The patient verbalized understanding and stated he was still having some dizzy spells but was going to the meclizine that Dr Jaynee Eagles had prescribed for him.  I advised him to give [place] a call if he has any further concerns or questions. He verbalized understanding and appreciation for the call.

## 2021-01-12 NOTE — Progress Notes (Signed)
Subjective:  Patient ID: George Franco, male    DOB: [birth year],  MRN: [id number]  Chief Complaint  Patient presents with  . Nail Problem    Nail check     65 y.o. male presents with the above complaint.  Patient presents with a follow-up of left dystrophic nail with underlying ingrown nail.  Patient states that it is doing a lot better he does not have any pain from it.  The slant back procedure gave him good relief.  He denies any other acute complaints.   Review of Systems: Negative except as noted in the HPI. Denies N/V/F/Ch.  Past Medical History:  Diagnosis Date  . At risk for sleep apnea    STOP-BANG= 5     SENT TO PCP 06-02-2015  . BPH (benign prostatic hyperplasia)   . CTS (carpal tunnel syndrome)   . CTS (carpal tunnel syndrome)   . ED (erectile dysfunction) of organic origin   . Hyperlipidemia   . Hypertension   . Hypogonadism in male   . Knee pain    pt said he doesn't know anything about this  . Lower urinary tract symptoms (LUTS)   . LVH (left ventricular hypertrophy)   . Persistent vertigo of central origin   . Prostate cancer [facility]) urologist-  dr ottelin/  oncologist-  [name]   Stage T1c, Gleason 4+3, PSA 4.48,  vol 32.9cc--  External RXT complete 05-08-2015  . Stroke ([place])   . Type 2 diabetes mellitus ([place])   . Vestibular neuropathy   . Wears glasses     Current Outpatient Medications:  .  aspirin EC 81 MG tablet, Take 1 tablet (81 mg total) by mouth daily., Disp: 30 tablet, Rfl: 0 .  benazepril (LOTENSIN) 40 MG tablet, Take 40 mg by mouth daily., Disp: , Rfl:  .  clopidogrel (PLAVIX) 75 MG tablet, Take 1 tablet (75 mg total) by mouth daily., Disp: 30 tablet, Rfl: 2 .  doxycycline (VIBRA-TABS) 100 MG tablet, Take 100 mg by mouth 2 (two) times daily., Disp: , Rfl:  .  doxycycline (VIBRA-TABS) 100 MG tablet, Take 1 tablet (100 mg total) by mouth 2 (two) times daily., Disp: 28 tablet, Rfl: 0 .  ezetimibe (ZETIA) 10 MG tablet, Take 10 mg by mouth daily.,  Disp: , Rfl:  .  FARXIGA 10 MG TABS tablet, Take 10 mg by mouth daily., Disp: , Rfl:  .  Insulin Degludec 100 UNIT/ML SOLN, Inject 20 Units into the skin daily., Disp: , Rfl:  .  loperamide (IMODIUM) 2 MG capsule, Take 2 mg by mouth every 6 (six) hours as needed for constipation., Disp: , Rfl: 0 .  meclizine (ANTIVERT) 25 MG tablet, Take 1 tablet (25 mg total) by mouth 3 (three) times daily as needed for dizziness., Disp: 30 tablet, Rfl: 2 .  NOVOLOG FLEXPEN 100 UNIT/ML FlexPen, Inject 20 Units into the skin 3 (three) times daily., Disp: , Rfl: 6 .  ONETOUCH VERIO test strip, USE TO TEST BLOOD SUGAR 3 TIMES A DAY PRIOR TO MEALS AND RECORD, Disp: , Rfl:  .  pantoprazole (PROTONIX) 40 MG tablet, Take 1 tablet (40 mg total) by mouth daily., Disp: 90 tablet, Rfl: 3 .  rosuvastatin (CRESTOR) 20 MG tablet, Take 20 mg by mouth daily., Disp: , Rfl:  .  zolpidem (AMBIEN) 5 MG tablet, Take 5 mg by mouth at bedtime., Disp: , Rfl:   Current Facility-Administered Medications:  .  0.9 %  sodium chloride infusion, 500 mL, Intravenous, Once,  Jackquline Denmark, MD  Social History   Tobacco Use  Smoking Status Current Some Day Smoker  . Packs/day: 0.50  . Years: 20.00  . Pack years: 10.00  . Types: Cigarettes  Smokeless Tobacco Never Used    No Known Allergies Objective:  There were no vitals filed for this visit. There is no height or weight on file to calculate BMI. Constitutional Well developed. Well nourished.  Vascular Dorsalis pedis pulses palpable bilaterally. Posterior tibial pulses palpable bilaterally. Capillary refill normal to all digits.  No cyanosis or clubbing noted. Pedal hair growth normal.  Neurologic Normal speech. Oriented to person, place, and time. Epicritic sensation to light touch grossly present bilaterally.  Dermatologic  no further pain on palpation of the left hallux ingrown nail borders.  No clinical signs of infection noted.  No purulent drainage was expressed.  No  ulcers noted.  Orthopedic: Normal joint ROM without pain or crepitus bilaterally. No visible deformities. No bony tenderness.   Radiographs: None Assessment:   1. Diabetic peripheral neuropathy associated with type 2 diabetes mellitus ([place])   2. Nail dystrophy    Plan:  Patient was evaluated and treated and all questions answered.  Left hallux ingrown with underlying nail dystrophy -Clinically healed.  At this time I will hold off on doing any type of an ingrown procedure.  If he continues to have pain we will discuss doing ingrown procedure however given that his diabetic is a high risk of developing wound complication.  Patient states understanding.  I discussed shoe gear modification.  No follow-ups on file.

## 2021-01-13 ENCOUNTER — Telehealth: Payer: Self-pay | Admitting: Neurology

## 2021-01-13 NOTE — Telephone Encounter (Addendum)
I called patient to discuss location for his MRI (he has [place] in network with [facility]). He states it does not matter where he gets his MRIs done. I called [employer] [phone] and was directed to [facility]. I spoke with [place] to get auth for MRI brain [id number]) and MRI cervical spine ([id number]). Sending patient to [facility] in [place].  MRI brain approval- [id number] (01/13/21- 02/11/21) MRI cervical spine- [id number] (01/19/21- 02/17/21) I called [facility] @ [phone]. I was advised to fax the orders to [phone].

## 2021-02-01 DIAGNOSIS — H903 Sensorineural hearing loss, bilateral: Secondary | ICD-10-CM | POA: Insufficient documentation

## 2021-02-01 DIAGNOSIS — R42 Dizziness and giddiness: Secondary | ICD-10-CM | POA: Insufficient documentation

## 2021-02-15 NOTE — Telephone Encounter (Signed)
Patient called me stating he does not have transportation to [facility] for MRI. Advised patient I will find a location in [place] to send MRI to.

## 2021-02-18 NOTE — Telephone Encounter (Signed)
Spoke with patient, his insurance through [place] termed in June. He states he has new insurance but has not received the card in the mail yet. He will call me back when he gets this info so that I can obtain auth.

## 2021-02-18 NOTE — Telephone Encounter (Signed)
I called [facility]. I was told there are no imaging locations for patient to go to in [place]. I will get auth for his MRIs because the previous Josem Kaufmann has expired and send the orders to the hospital.

## 2021-07-06 ENCOUNTER — Ambulatory Visit (INDEPENDENT_AMBULATORY_CARE_PROVIDER_SITE_OTHER): Payer: 59 | Admitting: Adult Health

## 2021-07-06 ENCOUNTER — Encounter: Payer: Self-pay | Admitting: Adult Health

## 2021-07-06 ENCOUNTER — Other Ambulatory Visit: Payer: Self-pay

## 2021-07-06 VITALS — BP 160/75 | HR 101 | Ht 69.0 in | Wt 198.0 lb

## 2021-07-06 DIAGNOSIS — R2689 Other abnormalities of gait and mobility: Secondary | ICD-10-CM | POA: Diagnosis not present

## 2021-07-06 DIAGNOSIS — R42 Dizziness and giddiness: Secondary | ICD-10-CM

## 2021-07-06 DIAGNOSIS — Z8673 Personal history of transient ischemic attack (TIA), and cerebral infarction without residual deficits: Secondary | ICD-10-CM | POA: Diagnosis not present

## 2021-07-06 DIAGNOSIS — R4189 Other symptoms and signs involving cognitive functions and awareness: Secondary | ICD-10-CM | POA: Diagnosis not present

## 2021-07-06 NOTE — Patient Instructions (Signed)
You will be called to schedule imaging of your head and neck to rule out underlying causes contributing to your imbalance  Referral will be placed to re-start vestibular therapy - this is very important to participate in to help improve your symptoms  Follow up will be determined after completion of imaging and therapy       Thank you for coming to see [place] at [facility]. I hope we have been able to provide you high quality care today.  You may receive a patient satisfaction survey over the next few weeks. We would appreciate your feedback and comments so that we may continue to improve ourselves and the health of our patients.

## 2021-07-06 NOTE — Progress Notes (Addendum)
[facility]    Provider:  Dr Jaynee Eagles Requesting Provider: [name], MD Primary Care Provider:  [name], MD    CC: dizziness and vertigo and imbalance  Update 07/06/2021 JM: Returns for 26-month follow-up unaccompanied  C/o imbalance - continued use of cane at all times. He did have a recent fall (tripped over a drop cord) thankfully without injury. No other recent falls. More so feels unsteady - is not worse specifically on uneven ground.  At first stated not new issue but then reported it was new - once asked for how long, he stated since the last time he was here 6 months ago. Denies any reoccurring dizziness or vertigo but then reported he will have dizziness with quick head movements which is not new. He has been increasing water intake.   Previously seen in office for chronic persistent worsening vertigo/dizziness by Dr. Jaynee Eagles - he has not yet completed recommend MR brain and cervical spine.  He was seen by ENT 02/01/2021 who recommended vestibular therapy for his continued disequilibrium which is likely microvascular issue.  Previously participated in vestibular therapy for only 1 session due to financial reasons - he did not do any additional therapy sessions since prior visit.     History provided for reference purposes only Update 12/30/2020 Dr. Jaynee Eagles: patient here for dizziness, was referred to vestibular PT but only went once and declined any further appointments despite recommendations that this may help. He is back for follow up. He says vertigo has been terrible. It never stops. Feelings of motion and dizziness when standing or walking. If he looks up he gets dizzy, turn head either way he feels dizzy, feels like motion when there is no motion, happens even when sitting.  Not recently to ENT, decreased hearing in the right ear. He has imbalance with walking as well.   Update 09/27/2020 JM [name] is being seen for acute visit. He was previously  seen by Dr. Jaynee Eagles in 05/2019 for vertigo type episodes and stable since that time but reports 2-week onset of dizziness greatly interfering with ambulation. Evaluated by PCP who prescribed meclizine and advised to follow-up with our office for further evaluation.  Reports dizziness has been constant present upon awakening and worsening throughout the day.  He also reports worsening when turning head quickly from side to side or when he stands from sitting for prolonged period.  Denies symptoms while sitting.  He has been using a cane which he was using previously and denies any recent falls.  Use of meclizine without benefit.  He will occasionally have difficulty focusing with both eyes open but denies blurred or double vision.  He does admit to having a prescription for glasses but lost them and has not had recent follow-up with ophthalmology.  Denies weakness, numbness/tingling, speech or language difficulties or headache.  Towards the end of visit, he also reports diminished appetite and minimal to no fluid intake throughout the day  Recent lab work by PCP personally reviewed which was all satisfactory  Update 06/05/2019 Dr. Jaynee Eagles: today patient comes in for follow up, multiple lacunar strokes in the right pons, discussed lacunar strokes, causes, he smells heavily of cigarette smoke and we discussed smoking as a big risk factor, cholesterol, diabetes. We reviewed all the images togather, showed him the microvascular chronic changes. He needs close follow up and strict management of vascular risk factors. Will complete stroke workup with CTA head and echocardiogram.   Initial visit 05/13/2019 Dr. Jaynee Eagles:  George [facility] is a 65 y.o. male here as requested by [name], MD for vertigo.  Past medical history hypertension, hyperlipidemia, LVH, BPH, knee pain, CTS, hypergonadism, prostate cancer 2016 stage T1c adenocarcinoma of the prostate with a Gleason score 4+3 and a PSA of 4.48, vestibular neuropathy  per MRI April 2016, persistent vertigo. Vertigo started several years ago, it is episodic, it hits him "all of a sudden", he feels dizzy and feels like he is going to fall. He is going to have another MRI 05/28/2019 ordered by Dr. Forde Dandy. Vertigo lasts all day, if he turn to the left the vertigo worsens, he says the episodes come and go and he started having the symptoms sine 8am. If he turns his head he can make the symptoms worse. He was shown the Epley Maneuvers by ENT. Never been to vestibular therapy. No vomiting. No weakness or vision changes or any other focal deficit. Sitting still helps the vertigo. No inciting events, he has several episodes a year lasting 1-2 days. He sometimes has headaches, frontal, dull not migrainous. Lack of sleep worsens. Melatonin helps him sleep. No other focal neurologic deficits, associated symptoms, inciting events or modifiable factors.  Reviewed notes, labs and imaging from outside physicians, which showed: 03/25/2019: Ct w/wo showed No acute intracranial abnormalities including mass lesion or mass effect, hydrocephalus, extra-axial fluid collection, midline shift, hemorrhage, or acute infarction, large ischemic events (personally reviewed images)  Reviewed MRI brain report 11/2014: FINDINGS: Major intracranial vascular flow voids are stable. Cerebral volume is normal. No restricted diffusion to suggest acute infarction. No midline shift, mass effect, evidence of mass lesion, ventriculomegaly, extra-axial collection or acute intracranial hemorrhage. Cervicomedullary junction and pituitary are within normal limits. Negative visualized cervical spine.  Cbc normal, cmp with elevated glucose otherwise unremarkable   Pearline Cables and white matter signal throughout the brain appears within normal limits for age and not changed since 2007. No cortical encephalomalacia or chronic blood products identified.   Diminutive appearance of both IAC which is chronic. Other  visible internal auditory structures appear grossly normal in the mastoids are clear.   Negative paranasal sinuses. Visualized orbit soft tissues are within normal limits. Normal bone marrow signal. Visualized scalp soft tissues are within normal limits.   IMPRESSION: 1. Stable and essentially normal for age noncontrast MRI appearance of the brain. 2. Chronic diminutive appearance of the bony internal auditory canals. Is there any evidence of vestibulocochlear neuropathy, or any other cranial neuropathies to suggest entrapment such as due to hyperostosis crani  alis interna?  I reviewed Dr. Baldwin Crown notes.  Patient presented for short-term disability and restrictions form to be filled out.  Dizziness had improved "a little bit" at last appointment.  He had been offered neurology referral in the past but declined but at the last appointment wanted to see neurology.  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  No vision changes.  I reviewed examination which showed slightly elevated blood pressure at 155/74, otherwise physical exam was normal, he did complain of vertigo, dizziness and gait instability.   Review of Systems: Patient complains of symptoms per HPI as well as the following symptoms: Imbalance. Pertinent negatives and positives per HPI. All others negative    Social History   Socioeconomic History   Marital status: Married  Spouse name: Not on file   Number of children: 3   Years of education: Not on file   Highest education level: Not on file  Occupational History   Occupation: Glass blower/designer    Employer: [employer]  Tobacco Use   Smoking status: Some Days    Packs/day: 0.50    Years: 20.00    Pack years: 10.00    Types: Cigarettes   Smokeless tobacco: Never  Vaping Use    Vaping Use: Never used  Substance and Sexual Activity   Alcohol use: Yes    Alcohol/week: 0.0 standard drinks    Comment: occasional   Drug use: No   Sexual activity: Yes    Partners: Female  Other Topics Concern   Not on file  Social History Narrative   Married, 2 sons one daughter   Retired    Caffeine: none    0-1 alcoholic drinks/week   He is a smoker   No drug use   Lives at home with wife   Social Determinants of Health   Financial Resource Strain: Not on file  Food Insecurity: Not on file  Transportation Needs: Not on file  Physical Activity: Not on file  Stress: Not on file  Social Connections: Not on file  Intimate Partner Violence: Not on file    Family History  Problem Relation Age of Onset   Prostate cancer Father    Diabetes Father    Diabetes Mother    Diabetes Brother    Colon cancer Neg Hx    Esophageal cancer Neg Hx    Liver cancer Neg Hx    Pancreatic cancer Neg Hx    Rectal cancer Neg Hx    Stomach cancer Neg Hx     Past Medical History:  Diagnosis Date   At risk for sleep apnea    STOP-BANG= 5     SENT TO PCP 06-02-2015   BPH (benign prostatic hyperplasia)    CTS (carpal tunnel syndrome)    CTS (carpal tunnel syndrome)    ED (erectile dysfunction) of organic origin    Hyperlipidemia    Hypertension    Hypogonadism in male    Knee pain    pt said he doesn't know anything about this   Lower urinary tract symptoms (LUTS)    LVH (left ventricular hypertrophy)    Persistent vertigo of central origin    Prostate cancer [facility]) urologist-  dr ottelin/  oncologist-  [name]   Stage T1c, Gleason 4+3, PSA 4.48,  vol 32.9cc--  External RXT complete 05-08-2015   Stroke ([place])    Type 2 diabetes mellitus ([place])    Vestibular neuropathy    Wears glasses     Patient Active Problem List   Diagnosis Date Noted   Lacunar stroke ([place]) 06/05/2019   BPPV (benign paroxysmal positional vertigo) 05/13/2019   Abnormal CT scan of lung - RLL ground  glass density 01/26/2018   Malignant neoplasm of prostate ([place]) 01/26/2015   Dizziness and giddiness 11/18/2014   Essential hypertension, benign 11/18/2014    Past Surgical History:  Procedure Laterality Date   COLONOSCOPY     IR ANGIOGRAM EXTREMITY RIGHT  03/05/2020   IR FEM POP ART PTA MOD SED  03/05/2020   IR RADIOLOGIST EVAL & MGMT  02/26/2020   IR RADIOLOGIST EVAL & MGMT  04/15/2020   IR RADIOLOGIST EVAL & MGMT  07/22/2020   IR TIB-PERO ART ATHEREC INC PTA MOD SED  03/05/2020   IR US  GUIDE VASC ACCESS RIGHT  03/05/2020   PROSTATE BIOPSY     RADIOACTIVE SEED IMPLANT N/A 06/05/2015   Procedure: RADIOACTIVE SEED IMPLANT/BRACHYTHERAPY IMPLANT;  Surgeon: [name], MD;  Location: [facility];  Service: Urology;  Laterality: N/A;   ROTATOR CUFF REPAIR Right [id number]   UMBILICAL HERNIA REPAIR  1990's   UPPER GASTROINTESTINAL ENDOSCOPY     wisdom teeth extrat      Current Outpatient Medications  Medication Sig Dispense Refill   aspirin EC 81 MG tablet Take 1 tablet (81 mg total) by mouth daily. 30 tablet 0   benazepril (LOTENSIN) 40 MG tablet Take 40 mg by mouth daily.     citalopram (CELEXA) 10 MG tablet Take 10 mg by mouth daily.     clopidogrel (PLAVIX) 75 MG tablet Take 1 tablet (75 mg total) by mouth daily. 30 tablet 2   doxycycline (VIBRA-TABS) 100 MG tablet Take 100 mg by mouth 2 (two) times daily.     doxycycline (VIBRA-TABS) 100 MG tablet Take 1 tablet (100 mg total) by mouth 2 (two) times daily. 28 tablet 0   ezetimibe (ZETIA) 10 MG tablet Take 10 mg by mouth daily.     FARXIGA 10 MG TABS tablet Take 10 mg by mouth daily.     Insulin Degludec 100 UNIT/ML SOLN Inject 20 Units into the skin daily.     loperamide (IMODIUM) 2 MG capsule Take 2 mg by mouth every 6 (six) hours as needed for constipation.  0   meclizine (ANTIVERT) 25 MG tablet Take 1 tablet (25 mg total) by mouth 3 (three) times daily as needed for dizziness. 30 tablet 2   NOVOLOG FLEXPEN 100 UNIT/ML  FlexPen Inject 20 Units into the skin 3 (three) times daily.  6   ONETOUCH VERIO test strip USE TO TEST BLOOD SUGAR 3 TIMES A DAY PRIOR TO MEALS AND RECORD     pantoprazole (PROTONIX) 40 MG tablet Take 1 tablet (40 mg total) by mouth daily. 90 tablet 3   rosuvastatin (CRESTOR) 20 MG tablet Take 20 mg by mouth daily.     zolpidem (AMBIEN) 5 MG tablet Take 5 mg by mouth at bedtime.     Current Facility-Administered Medications  Medication Dose Route Frequency Provider Last Rate Last Admin   0.9 %  sodium chloride infusion  500 mL Intravenous Once Jackquline Denmark, MD        Allergies as of 07/06/2021   (No Known Allergies)    Vitals: Today's Vitals   07/06/21 1245  BP: (!) 160/75  Pulse: (!) 101  Weight: 198 lb (89.8 kg)  Height: 5\' 9"  (1.753 m)    Body mass index is 29.24 kg/m.   Exam: NAD, pleasant                  Speech:    Speech is normal; fluent and spontaneous with normal comprehension.  Cognition:    The patient is oriented to person, place, and time;     recent and remote memory intact;     language fluent;    Cranial Nerves:    The pupils are equal, round, and reactive to light.Smooth pursuit, no nystagmus. Trigeminal sensation is intact and the muscles of mastication are normal. The face is symmetric. The palate elevates in the midline. Hearing intact. Voice is normal. Shoulder shrug is normal. The tongue has normal motion without fasciculations.   Coordination:  No dysmetria  Motor Observation:    No asymmetry, no atrophy, and  no involuntary movements noted. Tone:    Normal muscle tone.     Strength:    Strength is symmetrical in the upper and lower limbs.       Gait: wide based gait with mild unsteadiness, mildly ataxic, worse when making turns. Use of cane.  Unable to complete Romberg (reports closing eyes made him dizzy)    Assessment/Plan:  65 year old with chronic persistent worsening dizziness/vertigo.  Seen by [place] previously multiple times back  through 2020(carotid dopplers 06/2019 normal) with imaging showing evidence of multiple pontine infarcts on imaging likely incidental in setting of small vessel disease. Exam is non focal. Today, complains of imbalance present over the past 6 months but per prior hx, c/o imbalance chronically.   -advised him to complete prior testing as advised by Dr Jaynee Eagles including MR brain w/wo contrast and MR cervical spine - orders renewed  -referral placed to vestibular rehab -discussed importance of this rehab and if there are financial concerns to speak with the that office for payment plans -Advised use of cane for fall prevention - per Dr. Jaynee Eagles, if no other etiology then would defer to PCP to consider sending to the [facility] vestibular clinic and we will return patient back to pcp for dizziness  -Throughout prior follow-ups, some evidence of short term memory loss especially noted with history taking. This was not specifically discussed in detail during visit due to time constraints. No specific worsening, just still persistent. Known hx of multiple prior strokes likely contributing factor. Already planning on repeat MRI brain w/wo contrast. Referral placed to neuropsychology for full neurocognitive evaluation. Can continue to follow with PCP for ongoing monitoring -continue to follow with PCP for aggressive stroke risk factor management      CC:  [name], MD   I spent 26 minutes of face-to-face and non-face-to-face time with patient.  This included previsit chart review, lab review, study review, order entry, electronic health record documentation, patient education and discussion regarding chronic dizziness/vertigo with imbalance and further evaluation, importance of vestibular rehab, and answered multiple other questions to patient's satisfaction  [name], [facility]  [facility] [address] [place], [place] [address]  Phone [phone] Fax  [phone] Note: This document was prepared with digital dictation and possible smart phrase technology. Any transcriptional errors that result from this process are unintentional.

## 2021-07-09 ENCOUNTER — Telehealth: Payer: Self-pay | Admitting: Adult Health

## 2021-07-09 ENCOUNTER — Ambulatory Visit: Payer: BLUE CROSS/BLUE SHIELD | Admitting: Podiatry

## 2021-07-09 NOTE — Telephone Encounter (Signed)
Tisha with [facility] called and stated we did not do what we were supposed to do. Says pt was supposed to be seen for memory and not dizziness. Judie Petit is requesting a call back.

## 2021-07-12 ENCOUNTER — Telehealth: Payer: Self-pay | Admitting: Adult Health

## 2021-07-12 NOTE — Telephone Encounter (Signed)
Hey, I am not sure if they are aware that the pt was seen for his regular FU, looks like pt declined that referral, correct? Could you please advise?

## 2021-07-12 NOTE — Telephone Encounter (Signed)
Noted  

## 2021-07-12 NOTE — Telephone Encounter (Signed)
[employer] auth: [id number] (exp. 07/12/21 to 08/10/21) order sent to GI, they will reach out to the patient to schedule.

## 2021-07-13 NOTE — Addendum Note (Signed)
Addended by: [name] L on: 07/13/2021 05:49 PM   Modules accepted: Orders

## 2021-07-23 ENCOUNTER — Other Ambulatory Visit: Payer: Self-pay

## 2021-07-23 ENCOUNTER — Ambulatory Visit (INDEPENDENT_AMBULATORY_CARE_PROVIDER_SITE_OTHER): Payer: 59 | Admitting: Podiatry

## 2021-07-23 DIAGNOSIS — B351 Tinea unguium: Secondary | ICD-10-CM | POA: Diagnosis not present

## 2021-07-23 DIAGNOSIS — M79674 Pain in right toe(s): Secondary | ICD-10-CM

## 2021-07-23 DIAGNOSIS — E1142 Type 2 diabetes mellitus with diabetic polyneuropathy: Secondary | ICD-10-CM

## 2021-07-23 DIAGNOSIS — M79675 Pain in left toe(s): Secondary | ICD-10-CM

## 2021-07-28 ENCOUNTER — Encounter: Payer: Self-pay | Admitting: Podiatry

## 2021-07-28 NOTE — Progress Notes (Signed)
°  Subjective:  Patient ID: George Franco, male    DOB: [birth year],  MRN: [id number]  Chief Complaint  Patient presents with   Nail Problem    Nail trim    65 y.o. male returns for the above complaint.  Patient presents with thickened elongated dystrophic toenails x10.  Patient is a diabetic with last A1c that is unknown.  He states he is doing well.  He denies any other acute complaints he would like to have the nails trimmed down his not able to do it himself.  Objective:  There were no vitals filed for this visit. Podiatric Exam: Vascular: dorsalis pedis and posterior tibial pulses are palpable bilateral. Capillary return is immediate. Temperature gradient is WNL. Skin turgor WNL  Sensorium: Normal Semmes Weinstein monofilament test. Normal tactile sensation bilaterally. Nail Exam: Pt has thick disfigured discolored nails with subungual debris noted bilateral entire nail hallux through fifth toenails.  Pain on palpation to the nails. Ulcer Exam: There is no evidence of ulcer or pre-ulcerative changes or infection. Orthopedic Exam: Muscle tone and strength are WNL. No limitations in general ROM. No crepitus or effusions noted. HAV  B/L.  Hammer toes 2-5  B/L. Skin: No Porokeratosis. No infection or ulcers    Assessment & Plan:   1. Pain due to onychomycosis of toenails of both feet   2. Diabetic peripheral neuropathy associated with type 2 diabetes mellitus ([place])      Patient was evaluated and treated and all questions answered.  Onychomycosis with pain  -Nails palliatively debrided as below. -Educated on self-care  Procedure: Nail Debridement Rationale: pain  Type of Debridement: manual, sharp debridement. Instrumentation: Nail nipper, rotary burr. Number of Nails: 10  Procedures and Treatment: Consent by patient was obtained for treatment procedures. The patient understood the discussion of treatment and procedures well. All questions were answered thoroughly reviewed.  Debridement of mycotic and hypertrophic toenails, 1 through 5 bilateral and clearing of subungual debris. No ulceration, no infection noted.  Return Visit-Office Procedure: Patient instructed to return to the office for a follow up visit 3 months for continued evaluation and treatment.  Boneta Lucks, DPM    No follow-ups on file.

## 2021-08-02 ENCOUNTER — Ambulatory Visit
Admission: RE | Admit: 2021-08-02 | Discharge: 2021-08-02 | Disposition: A | Discharge status: Home / Self Care | Payer: BLUE CROSS/BLUE SHIELD | Source: Ambulatory Visit | Attending: Adult Health | Admitting: Adult Health

## 2021-08-02 ENCOUNTER — Other Ambulatory Visit: Payer: Self-pay

## 2021-08-02 ENCOUNTER — Ambulatory Visit
Admission: RE | Admit: 2021-08-02 | Discharge: 2021-08-02 | Disposition: A | Discharge status: Home / Self Care | Payer: No Typology Code available for payment source | Source: Ambulatory Visit | Attending: Adult Health | Admitting: Adult Health

## 2021-08-02 DIAGNOSIS — R42 Dizziness and giddiness: Secondary | ICD-10-CM

## 2021-08-02 DIAGNOSIS — R2689 Other abnormalities of gait and mobility: Secondary | ICD-10-CM | POA: Diagnosis not present

## 2021-08-02 MED ORDER — GADOBENATE DIMEGLUMINE 529 MG/ML IV SOLN
19.0000 mL | Freq: Once | INTRAVENOUS | Status: AC | PRN
  Administered 2021-08-02: 14:00:00 19 mL via INTRAVENOUS

## 2021-08-03 ENCOUNTER — Telehealth: Payer: Self-pay | Admitting: Neurology

## 2021-08-03 NOTE — Telephone Encounter (Signed)
Pod 4: Patient'ss MRI of the cervical spine has multi-level moderate and severe stenosis with foraminal stenosis with compresive myelopathy. I think he needs evaluation for cervical decompression.At one level he has compressive myelopathy. I'd like to send him to Dr. Reatha Armour and/or Dr. Cephus Shelling. Including them on this correspindence in case they feel differently but I think he needs neurosurgical evaluation and surgery. Please discuss with patent and I will place referral to our wonderful neurosurgical colleagues, thanks  As far as his brain,w e can address at a later time, this needs to be first see below   IMPRESSION: This MRI of the cervical spine without contrast shows the following: 1.   Increased T2 signal within the right hemicord adjacent to C5 and C6-C7, consistent with compressive myelopathic change. 2.   At C3-C4, there is mild spinal stenosis and moderate left foraminal narrowing though there does not appear to be nerve root compression. 3.   At C4-C5, there is moderate spinal stenosis and moderate to moderately severe bilateral foraminal narrowing with some potential for C5 nerve root compression to either side. 4.   At C5-C6, there is moderately severe spinal stenosis and moderately severe bilateral foraminal narrowing with potential for C6 nerve root compression to either side. 5.   At C6-C7, there is moderately severe spinal stenosis, more to the right, and moderately severe left foraminal narrowing with potential for left C7 nerve root compression.

## 2021-08-04 ENCOUNTER — Encounter: Payer: Self-pay | Admitting: Psychology

## 2021-08-10 ENCOUNTER — Ambulatory Visit: Payer: 59 | Admitting: Physical Therapy

## 2021-08-10 NOTE — Telephone Encounter (Signed)
Pt's wife, Salvatore Shear (on [place]) would like a call from the nurse to discuss neurosurgery. Mr. Urbas told her he spoke with Dr. Jaynee Eagles this morning, but do not remember what the conservation was about.  Contact info: [phone]

## 2021-08-10 NOTE — Telephone Encounter (Signed)
I called the pt's wife back and LVM asking for call back.

## 2021-08-11 ENCOUNTER — Other Ambulatory Visit: Payer: Self-pay | Admitting: Neurology

## 2021-08-11 DIAGNOSIS — M4802 Spinal stenosis, cervical region: Secondary | ICD-10-CM

## 2021-08-11 NOTE — Telephone Encounter (Signed)
Spoke with wife Leretha Dykes and advised referral is being sent to neurosurgery. She will await a call to schedule but she does have their number if she hasn't heard by tomorrow or Friday.

## 2021-08-11 NOTE — Telephone Encounter (Signed)
Patient's wife called back.  She says she is taking care of most of her husband's healthcare needs as he is forgetful.  We discussed the message below from Dr. Jaynee Eagles regarding the pt's MRI c-spine findings and recommendations to see neurosurgery.  I let her know the neurosurgeon said they will get him in as soon as possible.  I told her I would check to make sure the referral gets sent.  I gave her [place] neuro[facility]'s phone number.  Her questions were answered.  She verbalized appreciation for the call.

## 2021-08-12 NOTE — Telephone Encounter (Signed)
Referral has been sent to [facility]. Phone: [phone].

## 2021-10-05 ENCOUNTER — Other Ambulatory Visit: Payer: Self-pay | Admitting: Neurological Surgery

## 2021-10-06 ENCOUNTER — Other Ambulatory Visit: Payer: Self-pay | Admitting: Neurological Surgery

## 2021-10-25 NOTE — Progress Notes (Signed)
Surgical Instructions ? ? ? Your procedure is scheduled on November 02, 2021. ? Report to [facility] Main Entrance "A" at 9:00 A.M., then check in with the Admitting office. ? Call this number if you have problems the morning of surgery: ? [phone] ? ? If you have any questions prior to your surgery date call [phone]: Open Monday-Friday 8am-4pm ? ? ? Remember: ? Do not eat or drink after midnight the night before your surgery ? ? Take these medicines the morning of surgery with A SIP OF WATER:  ?Citalopram (Celexa) ?Doxycycline (Vibra-Tabs) ?Ezetimibe (Zetia) ?Pantoprazole (Protonix) ?Rosuvastatin (Crestor)  ? ?If needed: ?Meclizine (Antivert) ? ?FOLLOW YOUR DOCTOR'S INSTRUCTIONS WHEN TO STOP YOUR PLAVIX AND ASPIRIN. IF NO INSTRUCTIONS WERE PROVIDED, YOU NEED TO CALL YOUR DOCTOR FOR THOSE INSTRUCTIONS. ? ?As of today, STOP taking any Aleve, Naproxen, Ibuprofen, Motrin, Advil, Goody's, BC's, all herbal medications, fish oil, and all vitamins. ? ?WHAT DO I DO ABOUT MY DIABETES MEDICATION? ? ? ?DO NOT TAKE [place] (11/01/21) OR THE DAY OF SURGERY (11/02/21). ? ?DO NOT TAKE INSULIN DEGLUDEC THE EVENING BEFORE SURGERY (3/37/23) OR THE DAY OF SURGERY. ? ?YOU MAY TAKE YOUR NOVOLOG FLEXPEN AS PRESCRIBED THE DAY BEFORE SURGERY (11/01/21). DO NOT TAKE A BEDTIME DOSE OF NOVOLOG. ? ?If your CBG is greater than 220 mg/dL, you may take a ?  dose of insulin, which would be 10 units of Novolog. ? ? ?HOW TO MANAGE YOUR DIABETES ?BEFORE AND AFTER SURGERY ? ?Why is it important to control my blood sugar before and after surgery? ?Improving blood sugar levels before and after surgery helps healing and can limit problems. ?A way of improving blood sugar control is eating a healthy diet by: ? Eating less sugar and carbohydrates ? Increasing activity/exercise ? Talking with your doctor about reaching your blood sugar goals ?High blood sugars (greater than 180 mg/dL) can raise your risk of infections and slow your  recovery, so you will need to focus on controlling your diabetes during the weeks before surgery. ?Make sure that the doctor who takes care of your diabetes knows about your planned surgery including the date and location. ? ?How do I manage my blood sugar before surgery? ?Check your blood sugar at least 4 times a day, starting 2 days before surgery, to make sure that the level is not too high or low. ? ?Check your blood sugar the morning of your surgery when you wake up and every 2 hours until you get to the Short Stay unit. ? ?If your blood sugar is less than 70 mg/dL, you will need to treat for low blood sugar: ?Do not take insulin. ?Treat a low blood sugar (less than 70 mg/dL) with ? cup of clear juice (cranberry or apple), 4 glucose tablets, OR glucose gel. ?Recheck blood sugar in 15 minutes after treatment (to make sure it is greater than 70 mg/dL). If your blood sugar is not greater than 70 mg/dL on recheck, call [phone] for further instructions. ?Report your blood sugar to the short stay nurse when you get to Short Stay. ? ?If you are admitted to the hospital after surgery: ?Your blood sugar will be checked by the staff and you will probably be given insulin after surgery (instead of oral diabetes medicines) to make sure you have good blood sugar levels. ?The goal for blood sugar control after surgery is 80-180 mg/dL. ? ?         ?Do not wear jewelry ?Do  not wear lotions, powders, colognes, or deodorant. ?Do not shave 48 hours prior to surgery.  Men may shave face and neck. ?Do not bring valuables to the hospital. ? ? ?[facility] is not responsible for any belongings or valuables. .  ? ?Do NOT Smoke (Tobacco/Vaping)  24 hours prior to your procedure ? ?If you use a CPAP at night, you may bring your mask for your overnight stay. ?  ?Contacts, glasses, hearing aids, dentures or partials may not be worn into surgery, please bring cases for these belongings ?  ?For patients admitted to the hospital,  discharge time will be determined by your treatment team. ?  ?Patients discharged the day of surgery will not be allowed to drive home, and someone needs to stay with them for 24 hours. ? ?NO VISITORS WILL BE ALLOWED IN PRE-OP WHERE PATIENTS ARE PREPPED FOR SURGERY.  ONLY 1 SUPPORT PERSON MAY BE PRESENT IN THE WAITING ROOM WHILE YOU ARE IN SURGERY.  IF YOU ARE TO BE ADMITTED, ONCE YOU ARE IN YOUR ROOM YOU WILL BE ALLOWED TWO (2) VISITORS. 1 (ONE) VISITOR MAY STAY OVERNIGHT BUT MUST ARRIVE TO THE ROOM BY 8pm.  Minor children may have two parents present. Special consideration for safety and communication needs will be reviewed on a case by case basis. ? ?Special instructions:   ? ?Oral Hygiene is also important to reduce your risk of infection.  Remember - BRUSH YOUR TEETH THE MORNING OF SURGERY WITH YOUR REGULAR TOOTHPASTE ? ? ?[facility]- Preparing For Surgery ? ?Before surgery, you can play an important role. Because skin is not sterile, your skin needs to be as free of germs as possible. You can reduce the number of germs on your skin by washing with CHG (chlorahexidine gluconate) Soap before surgery.  CHG is an antiseptic cleaner which kills germs and bonds with the skin to continue killing germs even after washing.   ? ? ?Please do not use if you have an allergy to CHG or antibacterial soaps. If your skin becomes reddened/irritated stop using the CHG.  ?Do not shave (including legs and underarms) for at least 48 hours prior to first CHG shower. It is OK to shave your face. ? ?Please follow these instructions carefully. ?  ? ? Shower the NIGHT BEFORE SURGERY and the MORNING OF SURGERY with CHG Soap.  ? If you chose to wash your hair, wash your hair first as usual with your normal shampoo. After you shampoo, rinse your hair and body thoroughly to remove the shampoo.  Then [employer] and genitals (private parts) with your normal soap and rinse thoroughly to remove soap. ? ?After that Use CHG Soap as you would any  other liquid soap. You can apply CHG directly to the skin and wash gently with a scrungie or a clean washcloth.  ? ?Apply the CHG Soap to your body ONLY FROM THE NECK DOWN.  Do not use on open wounds or open sores. Avoid contact with your eyes, ears, mouth and genitals (private parts). Wash Face and genitals (private parts)  with your normal soap.  ? ?Wash thoroughly, paying special attention to the area where your surgery will be performed. ? ?Thoroughly rinse your body with warm water from the neck down. ? ?DO NOT shower/wash with your normal soap after using and rinsing off the CHG Soap. ? ?Pat yourself dry with a CLEAN TOWEL. ? ?Wear CLEAN PAJAMAS to bed the night before surgery ? ?Place CLEAN SHEETS on your bed  the night before your surgery ? ?DO NOT SLEEP WITH PETS. ? ? ?Day of Surgery: ? ?Take a shower with CHG soap. ?Wear Clean/Comfortable clothing the morning of surgery ?Do not apply any deodorants/lotions.   ?Remember to brush your teeth WITH YOUR REGULAR TOOTHPASTE. ? ? ? ?BEFORE SURGERY: ? ?Notify your provider: ?if you are in close contact with someone who has COVID  ?or if you develop a fever of 100.4 or greater, sneezing, cough, sore throat, shortness of breath or body aches. ? ?  ?Please read over the following fact sheets that you were given.  ? ?

## 2021-10-26 ENCOUNTER — Other Ambulatory Visit: Payer: Self-pay

## 2021-10-26 ENCOUNTER — Encounter (HOSPITAL_COMMUNITY)
Admission: RE | Admit: 2021-10-26 | Discharge: 2021-10-26 | Disposition: A | Discharge status: Home / Self Care | Payer: Medicare Other | Source: Ambulatory Visit | Attending: Neurological Surgery | Admitting: Neurological Surgery

## 2021-10-26 ENCOUNTER — Encounter (HOSPITAL_COMMUNITY): Payer: Self-pay

## 2021-10-26 VITALS — BP 131/67 | HR 66 | Temp 98.4°F | Resp 17 | Ht 69.0 in | Wt 182.0 lb

## 2021-10-26 DIAGNOSIS — Z01818 Encounter for other preprocedural examination: Secondary | ICD-10-CM | POA: Insufficient documentation

## 2021-10-26 DIAGNOSIS — Z794 Long term (current) use of insulin: Secondary | ICD-10-CM | POA: Insufficient documentation

## 2021-10-26 DIAGNOSIS — E119 Type 2 diabetes mellitus without complications: Secondary | ICD-10-CM | POA: Diagnosis not present

## 2021-10-26 LAB — CBC
HCT: 40.2 % (ref 39.0–52.0)
Hemoglobin: 12.9 g/dL — ABNORMAL LOW (ref 13.0–17.0)
MCH: 30.9 pg (ref 26.0–34.0)
MCHC: 32.1 g/dL (ref 30.0–36.0)
MCV: 96.2 fL (ref 80.0–100.0)
Platelets: 188 10*3/uL (ref 150–400)
RBC: 4.18 MIL/uL — ABNORMAL LOW (ref 4.22–5.81)
RDW: 14.3 % (ref 11.5–15.5)
WBC: 6.3 10*3/uL (ref 4.0–10.5)
nRBC: 0.3 % — ABNORMAL HIGH (ref 0.0–0.2)

## 2021-10-26 LAB — BASIC METABOLIC PANEL
Anion gap: 10 (ref 5–15)
BUN: 5 mg/dL — ABNORMAL LOW (ref 8–23)
CO2: 29 mmol/L (ref 22–32)
Calcium: 9.3 mg/dL (ref 8.9–10.3)
Chloride: 103 mmol/L (ref 98–111)
Creatinine, Ser: 0.84 mg/dL (ref 0.61–1.24)
GFR, Estimated: >60 mL/min (ref >60)
Glucose, Bld: 60 mg/dL — ABNORMAL LOW (ref 70–99)
Potassium: 3.3 mmol/L — ABNORMAL LOW (ref 3.5–5.1)
Sodium: 142 mmol/L (ref 135–145)

## 2021-10-26 LAB — HEMOGLOBIN A1C
Hgb A1c MFr Bld: 6.5 % — ABNORMAL HIGH (ref 4.8–5.6)
Mean Plasma Glucose: 139.85 mg/dL

## 2021-10-26 LAB — TYPE AND SCREEN
ABO/RH(D): A POS
Antibody Screen: NEGATIVE

## 2021-10-26 LAB — SURGICAL PCR SCREEN
MRSA, PCR: NEGATIVE
Staphylococcus aureus: NEGATIVE

## 2021-10-26 LAB — GLUCOSE, CAPILLARY: Glucose-Capillary: 91 mg/dL (ref 70–99)

## 2021-10-26 NOTE — Progress Notes (Signed)
PCP - [name] ?Cardiologist - denes ? ?Chest x-ray - N/A ?EKG - 10/26/2021  ?Stress Test - pt reports having normal stress test at some time "years ago" ?ECHO - denies ?Cardiac Cath - denies ? ?Sleep Study - denies ? ?Blood sugar 91 at PAT. Pt states he checks his blood sugar daily, normally 115. A1c ordered to be drawn at PAT. ? ?Aspirin Instructions: pt states he stopped taking Aspirin 3 weeks ago per MD instructions.  ? ?ERAS Protcol - NPO order ? ?COVID TEST- N/A- pt asymptomatic and no exposures to COVID per patient.  ? ?Pt reports he has chronic dizziness and is followed by neurology for this. Pt states this is unchanged and denies syncope.  ? ?Anesthesia review: review EKG ? ?Patient denies shortness of breath, fever, cough and chest pain at PAT appointment ? ? ?All instructions explained to the patient, with a verbal understanding of the material. Patient agrees to go over the instructions while at home for a better understanding. Patient also instructed to self quarantine after being tested for COVID-19. The opportunity to ask questions was provided. ? ? ?

## 2021-10-26 NOTE — Progress Notes (Addendum)
Surgical Instructions ? ? ? Your procedure is scheduled on November 02, 2021. ? Report to [facility] Main Entrance "A" at 9:00 A.M., then check in with the Admitting office. ? Call this number if you have problems the morning of surgery: ? [phone] ? ? If you have any questions prior to your surgery date call [phone]: Open Monday-Friday 8am-4pm ? ? ? Remember: ? Do not eat or drink after midnight the night before your surgery ? ? Take these medicines the morning of surgery with A SIP OF WATER:  ?Citalopram (Celexa) ?Ezetimibe (Zetia) ?Pantoprazole (Protonix) ?Rosuvastatin (Crestor)  ? ?If needed: ?Meclizine (Antivert) ? ?FOLLOW YOUR DOCTOR'S INSTRUCTIONS WHEN TO STOP YOUR ASPIRIN. IF NO INSTRUCTIONS WERE PROVIDED, YOU NEED TO CALL YOUR DOCTOR FOR THOSE INSTRUCTIONS. ? ?As of today, STOP taking any Aleve, Naproxen, Ibuprofen, Motrin, Advil, Goody's, BC's, all herbal medications, fish oil, and all vitamins. ? ?WHAT DO I DO ABOUT MY DIABETES MEDICATION? ? ? ?DO NOT TAKE [place] (11/01/21) OR THE DAY OF SURGERY (11/02/21). ? ?Take HALF of your dose of Insulin Glargine (Basaglar) insulin the day of surgery (13 units) ? ?YOU MAY TAKE YOUR NOVOLOG FLEXPEN AS PRESCRIBED THE DAY BEFORE SURGERY (11/01/21). DO NOT TAKE A BEDTIME DOSE OF NOVOLOG the night before surgery. ? ?If your CBG is greater than 220 mg/dL, you may take a ?  dose of insulin, which would be 11 units of Novolog. ? ? ?HOW TO MANAGE YOUR DIABETES ?BEFORE AND AFTER SURGERY ? ?Why is it important to control my blood sugar before and after surgery? ?Improving blood sugar levels before and after surgery helps healing and can limit problems. ?A way of improving blood sugar control is eating a healthy diet by: ? Eating less sugar and carbohydrates ? Increasing activity/exercise ? Talking with your doctor about reaching your blood sugar goals ?High blood sugars (greater than 180 mg/dL) can raise your risk of infections and slow your  recovery, so you will need to focus on controlling your diabetes during the weeks before surgery. ?Make sure that the doctor who takes care of your diabetes knows about your planned surgery including the date and location. ? ?How do I manage my blood sugar before surgery? ?Check your blood sugar at least 4 times a day, starting 2 days before surgery, to make sure that the level is not too high or low. ? ?Check your blood sugar the morning of your surgery when you wake up and every 2 hours until you get to the Short Stay unit. ? ?If your blood sugar is less than 70 mg/dL, you will need to treat for low blood sugar: ?Do not take insulin. ?Treat a low blood sugar (less than 70 mg/dL) with ? cup of clear juice (cranberry or apple), 4 glucose tablets, OR glucose gel. ?Recheck blood sugar in 15 minutes after treatment (to make sure it is greater than 70 mg/dL). If your blood sugar is not greater than 70 mg/dL on recheck, call [phone] for further instructions. ?Report your blood sugar to the short stay nurse when you get to Short Stay. ? ?If you are admitted to the hospital after surgery: ?Your blood sugar will be checked by the staff and you will probably be given insulin after surgery (instead of oral diabetes medicines) to make sure you have good blood sugar levels. ?The goal for blood sugar control after surgery is 80-180 mg/dL. ? ?         ?Do not wear jewelry ?  Do not wear lotions, powders, colognes, or deodorant. ?Do not shave 48 hours prior to surgery.  Men may shave face and neck. ?Do not bring valuables to the hospital. ? ? ?[facility] is not responsible for any belongings or valuables. .  ? ?Do NOT Smoke (Tobacco/Vaping)  24 hours prior to your procedure ? ?If you use a CPAP at night, you may bring your mask for your overnight stay. ?  ?Contacts, glasses, hearing aids, dentures or partials may not be worn into surgery, please bring cases for these belongings ?  ?For patients admitted to the hospital,  discharge time will be determined by your treatment team. ?  ?Patients discharged the day of surgery will not be allowed to drive home, and someone needs to stay with them for 24 hours. ? ?NO VISITORS WILL BE ALLOWED IN PRE-OP WHERE PATIENTS ARE PREPPED FOR SURGERY.  ONLY 1 SUPPORT PERSON MAY BE PRESENT IN THE WAITING ROOM WHILE YOU ARE IN SURGERY.  IF YOU ARE TO BE ADMITTED, ONCE YOU ARE IN YOUR ROOM YOU WILL BE ALLOWED TWO (2) VISITORS. 1 (ONE) VISITOR MAY STAY OVERNIGHT BUT MUST ARRIVE TO THE ROOM BY 8pm.  Minor children may have two parents present. Special consideration for safety and communication needs will be reviewed on a case by case basis. ? ?Special instructions:   ? ?Oral Hygiene is also important to reduce your risk of infection.  Remember - BRUSH YOUR TEETH THE MORNING OF SURGERY WITH YOUR REGULAR TOOTHPASTE ? ? ?[facility]- Preparing For Surgery ? ?Before surgery, you can play an important role. Because skin is not sterile, your skin needs to be as free of germs as possible. You can reduce the number of germs on your skin by washing with CHG (chlorahexidine gluconate) Soap before surgery.  CHG is an antiseptic cleaner which kills germs and bonds with the skin to continue killing germs even after washing.   ? ? ?Please do not use if you have an allergy to CHG or antibacterial soaps. If your skin becomes reddened/irritated stop using the CHG.  ?Do not shave (including legs and underarms) for at least 48 hours prior to first CHG shower. It is OK to shave your face. ? ?Please follow these instructions carefully. ?  ? ? Shower the NIGHT BEFORE SURGERY and the MORNING OF SURGERY with CHG Soap.  ? If you chose to wash your hair, wash your hair first as usual with your normal shampoo. After you shampoo, rinse your hair and body thoroughly to remove the shampoo.  Then [employer] and genitals (private parts) with your normal soap and rinse thoroughly to remove soap. ? ?After that Use CHG Soap as you would any  other liquid soap. You can apply CHG directly to the skin and wash gently with a scrungie or a clean washcloth.  ? ?Apply the CHG Soap to your body ONLY FROM THE NECK DOWN.  Do not use on open wounds or open sores. Avoid contact with your eyes, ears, mouth and genitals (private parts). Wash Face and genitals (private parts)  with your normal soap.  ? ?Wash thoroughly, paying special attention to the area where your surgery will be performed. ? ?Thoroughly rinse your body with warm water from the neck down. ? ?DO NOT shower/wash with your normal soap after using and rinsing off the CHG Soap. ? ?Pat yourself dry with a CLEAN TOWEL. ? ?Wear CLEAN PAJAMAS to bed the night before surgery ? ?Place CLEAN SHEETS on your  bed the night before your surgery ? ?DO NOT SLEEP WITH PETS. ? ? ?Day of Surgery: ? ?Take a shower with CHG soap. ?Wear Clean/Comfortable clothing the morning of surgery ?Do not apply any deodorants/lotions.   ?Remember to brush your teeth WITH YOUR REGULAR TOOTHPASTE. ? ? ? ?BEFORE SURGERY: ? ?Notify your provider: ?if you are in close contact with someone who has COVID  ?or if you develop a fever of 100.4 or greater, sneezing, cough, sore throat, shortness of breath or body aches. ? ?  ?Please read over the following fact sheets that you were given.  ? ?

## 2021-11-02 ENCOUNTER — Inpatient Hospital Stay (HOSPITAL_COMMUNITY): Payer: Medicare Other

## 2021-11-02 ENCOUNTER — Other Ambulatory Visit: Payer: Self-pay

## 2021-11-02 ENCOUNTER — Encounter (HOSPITAL_COMMUNITY): Payer: Self-pay | Admitting: Neurological Surgery

## 2021-11-02 ENCOUNTER — Inpatient Hospital Stay (HOSPITAL_COMMUNITY)
Admission: RE | Admit: 2021-11-02 | Discharge: 2021-11-04 | DRG: 472 | Disposition: A | Discharge status: Home Health | Payer: Medicare Other | Attending: Neurological Surgery | Admitting: Neurological Surgery

## 2021-11-02 ENCOUNTER — Inpatient Hospital Stay (HOSPITAL_COMMUNITY): Payer: Medicare Other | Admitting: Physician Assistant

## 2021-11-02 ENCOUNTER — Encounter (HOSPITAL_COMMUNITY)
Admission: RE | Disposition: A | Discharge status: Home Health | Payer: Self-pay | Source: Home / Self Care | Attending: Neurological Surgery

## 2021-11-02 ENCOUNTER — Inpatient Hospital Stay (HOSPITAL_COMMUNITY): Payer: Medicare Other | Admitting: Anesthesiology

## 2021-11-02 DIAGNOSIS — E119 Type 2 diabetes mellitus without complications: Secondary | ICD-10-CM

## 2021-11-02 DIAGNOSIS — Z7984 Long term (current) use of oral hypoglycemic drugs: Secondary | ICD-10-CM

## 2021-11-02 DIAGNOSIS — M4712 Other spondylosis with myelopathy, cervical region: Principal | ICD-10-CM | POA: Diagnosis present

## 2021-11-02 DIAGNOSIS — E785 Hyperlipidemia, unspecified: Secondary | ICD-10-CM | POA: Diagnosis present

## 2021-11-02 DIAGNOSIS — Z79899 Other long term (current) drug therapy: Secondary | ICD-10-CM

## 2021-11-02 DIAGNOSIS — Z7982 Long term (current) use of aspirin: Secondary | ICD-10-CM

## 2021-11-02 DIAGNOSIS — Z8546 Personal history of malignant neoplasm of prostate: Secondary | ICD-10-CM

## 2021-11-02 DIAGNOSIS — G9589 Other specified diseases of spinal cord: Secondary | ICD-10-CM | POA: Diagnosis present

## 2021-11-02 DIAGNOSIS — Z8042 Family history of malignant neoplasm of prostate: Secondary | ICD-10-CM | POA: Diagnosis not present

## 2021-11-02 DIAGNOSIS — G959 Disease of spinal cord, unspecified: Principal | ICD-10-CM

## 2021-11-02 DIAGNOSIS — N400 Enlarged prostate without lower urinary tract symptoms: Secondary | ICD-10-CM | POA: Diagnosis present

## 2021-11-02 DIAGNOSIS — I10 Essential (primary) hypertension: Secondary | ICD-10-CM | POA: Diagnosis present

## 2021-11-02 DIAGNOSIS — Z8673 Personal history of transient ischemic attack (TIA), and cerebral infarction without residual deficits: Secondary | ICD-10-CM | POA: Diagnosis not present

## 2021-11-02 DIAGNOSIS — M4802 Spinal stenosis, cervical region: Secondary | ICD-10-CM | POA: Diagnosis present

## 2021-11-02 DIAGNOSIS — Z833 Family history of diabetes mellitus: Secondary | ICD-10-CM

## 2021-11-02 DIAGNOSIS — Z794 Long term (current) use of insulin: Secondary | ICD-10-CM

## 2021-11-02 HISTORY — PX: ANTERIOR CERVICAL DECOMP/DISCECTOMY FUSION: SHX1161

## 2021-11-02 LAB — GLUCOSE, CAPILLARY
Glucose-Capillary: 124 mg/dL — ABNORMAL HIGH (ref 70–99)
Glucose-Capillary: 54 mg/dL — ABNORMAL LOW (ref 70–99)
Glucose-Capillary: 129 mg/dL — ABNORMAL HIGH (ref 70–99)
Glucose-Capillary: 165 mg/dL — ABNORMAL HIGH (ref 70–99)
Glucose-Capillary: 185 mg/dL — ABNORMAL HIGH (ref 70–99)
Glucose-Capillary: 183 mg/dL — ABNORMAL HIGH (ref 70–99)

## 2021-11-02 LAB — ABO/RH: ABO/RH(D): A POS

## 2021-11-02 SURGERY — ANTERIOR CERVICAL DECOMPRESSION/DISCECTOMY FUSION 3 LEVELS
Anesthesia: General | Site: Spine Cervical

## 2021-11-02 MED ORDER — ROCURONIUM BROMIDE 10 MG/ML (PF) SYRINGE
PREFILLED_SYRINGE | INTRAVENOUS | Status: AC
  Filled 2021-11-02: qty 30

## 2021-11-02 MED ORDER — ACETAMINOPHEN 650 MG RE SUPP: 650.0000 mg | RECTAL | Status: DC | PRN

## 2021-11-02 MED ORDER — LACTATED RINGERS IV SOLN: INTRAVENOUS | Status: DC

## 2021-11-02 MED ORDER — FENTANYL CITRATE (PF) 250 MCG/5ML IJ SOLN
INTRAMUSCULAR | Status: DC | PRN
  Administered 2021-11-02: 50 ug via INTRAVENOUS
  Administered 2021-11-02: 100 ug via INTRAVENOUS
  Administered 2021-11-02 (×2): 50 ug via INTRAVENOUS

## 2021-11-02 MED ORDER — METHOCARBAMOL 1000 MG/10ML IJ SOLN
500.0000 mg | Freq: Four times a day (QID) | INTRAVENOUS | Status: DC | PRN
  Filled 2021-11-02: qty 5

## 2021-11-02 MED ORDER — ACETAMINOPHEN 325 MG PO TABS
650.0000 mg | ORAL_TABLET | ORAL | Status: DC | PRN
  Administered 2021-11-02 – 2021-11-04 (×3): 650 mg via ORAL
  Filled 2021-11-02 (×4): qty 2

## 2021-11-02 MED ORDER — ACETAMINOPHEN 500 MG PO TABS
1000.0000 mg | ORAL_TABLET | Freq: Once | ORAL | Status: AC
  Administered 2021-11-02: 1000 mg via ORAL
  Filled 2021-11-02: qty 2

## 2021-11-02 MED ORDER — CITALOPRAM HYDROBROMIDE 10 MG PO TABS
10.0000 mg | ORAL_TABLET | Freq: Every day | ORAL | Status: DC
  Administered 2021-11-03 – 2021-11-04 (×2): 10 mg via ORAL
  Filled 2021-11-02 (×2): qty 1

## 2021-11-02 MED ORDER — THROMBIN 5000 UNITS EX SOLR
OROMUCOSAL | Status: DC | PRN
  Administered 2021-11-02: 5 mL via TOPICAL

## 2021-11-02 MED ORDER — LIDOCAINE 2% (20 MG/ML) 5 ML SYRINGE
INTRAMUSCULAR | Status: DC | PRN
  Administered 2021-11-02: 60 mg via INTRAVENOUS

## 2021-11-02 MED ORDER — TRIAMCINOLONE ACETONIDE 40 MG/ML IJ SUSP
INTRAMUSCULAR | Status: AC
  Filled 2021-11-02: qty 5

## 2021-11-02 MED ORDER — PHENOL 1.4 % MT LIQD
1.0000 | OROMUCOSAL | Status: DC | PRN
  Administered 2021-11-03: 1 via OROMUCOSAL
  Filled 2021-11-02: qty 177

## 2021-11-02 MED ORDER — DAPAGLIFLOZIN PROPANEDIOL 10 MG PO TABS
10.0000 mg | ORAL_TABLET | Freq: Every day | ORAL | Status: DC
  Administered 2021-11-03 – 2021-11-04 (×2): 10 mg via ORAL
  Filled 2021-11-02 (×2): qty 1

## 2021-11-02 MED ORDER — HEMOSTATIC AGENTS (NO CHARGE) OPTIME
TOPICAL | Status: DC | PRN
  Administered 2021-11-02 (×2): 1 via TOPICAL

## 2021-11-02 MED ORDER — SODIUM CHLORIDE 0.9% FLUSH
3.0000 mL | Freq: Two times a day (BID) | INTRAVENOUS | Status: DC
  Administered 2021-11-02 – 2021-11-03 (×2): 3 mL via INTRAVENOUS

## 2021-11-02 MED ORDER — ROCURONIUM BROMIDE 10 MG/ML (PF) SYRINGE
PREFILLED_SYRINGE | INTRAVENOUS | Status: DC | PRN
Start: 2021-11-02 — End: 2021-11-02
  Administered 2021-11-02: 100 mg via INTRAVENOUS
  Administered 2021-11-02: 20 mg via INTRAVENOUS

## 2021-11-02 MED ORDER — EZETIMIBE 10 MG PO TABS
10.0000 mg | ORAL_TABLET | Freq: Every day | ORAL | Status: DC
  Administered 2021-11-03 – 2021-11-04 (×2): 10 mg via ORAL
  Filled 2021-11-02 (×2): qty 1

## 2021-11-02 MED ORDER — AMISULPRIDE (ANTIEMETIC) 5 MG/2ML IV SOLN: 10.0000 mg | Freq: Once | INTRAVENOUS | Status: DC | PRN

## 2021-11-02 MED ORDER — MIDAZOLAM HCL 2 MG/2ML IJ SOLN
INTRAMUSCULAR | Status: DC | PRN
  Administered 2021-11-02: 1 mg via INTRAVENOUS

## 2021-11-02 MED ORDER — CHLORHEXIDINE GLUCONATE 0.12 % MT SOLN
15.0000 mL | Freq: Once | OROMUCOSAL | Status: AC
  Administered 2021-11-02: 15 mL via OROMUCOSAL
  Filled 2021-11-02: qty 15

## 2021-11-02 MED ORDER — HYDROMORPHONE HCL 1 MG/ML IJ SOLN
0.2500 mg | INTRAMUSCULAR | Status: DC | PRN
  Administered 2021-11-02: 0.5 mg via INTRAVENOUS

## 2021-11-02 MED ORDER — ONDANSETRON HCL 4 MG/2ML IJ SOLN: 4.0000 mg | Freq: Once | INTRAMUSCULAR | Status: DC | PRN

## 2021-11-02 MED ORDER — BENAZEPRIL HCL 40 MG PO TABS
40.0000 mg | ORAL_TABLET | Freq: Every day | ORAL | Status: DC
  Administered 2021-11-03 – 2021-11-04 (×2): 40 mg via ORAL
  Filled 2021-11-02 (×2): qty 1

## 2021-11-02 MED ORDER — THROMBIN 5000 UNITS EX SOLR
CUTANEOUS | Status: AC
  Filled 2021-11-02: qty 15000

## 2021-11-02 MED ORDER — DEXTROSE 50 % IV SOLN
25.0000 g | INTRAVENOUS | Status: AC
  Filled 2021-11-02: qty 50

## 2021-11-02 MED ORDER — INSULIN GLARGINE-YFGN 100 UNIT/ML ~~LOC~~ SOLN
26.0000 [IU] | Freq: Every day | SUBCUTANEOUS | Status: DC
  Administered 2021-11-03 – 2021-11-04 (×2): 26 [IU] via SUBCUTANEOUS
  Filled 2021-11-02 (×2): qty 0.26

## 2021-11-02 MED ORDER — HYDROMORPHONE HCL 1 MG/ML IJ SOLN
INTRAMUSCULAR | Status: AC
  Filled 2021-11-02: qty 1

## 2021-11-02 MED ORDER — CHLORHEXIDINE GLUCONATE CLOTH 2 % EX PADS: 6.0000 | MEDICATED_PAD | Freq: Once | CUTANEOUS | Status: DC

## 2021-11-02 MED ORDER — CEFAZOLIN SODIUM-DEXTROSE 2-4 GM/100ML-% IV SOLN
2.0000 g | INTRAVENOUS | Status: AC
  Administered 2021-11-02: 2 g via INTRAVENOUS
  Filled 2021-11-02: qty 100

## 2021-11-02 MED ORDER — TRIAMCINOLONE ACETONIDE 40 MG/ML IJ SUSP
INTRAMUSCULAR | Status: DC | PRN
  Administered 2021-11-02: 200 mg

## 2021-11-02 MED ORDER — DEXAMETHASONE SODIUM PHOSPHATE 10 MG/ML IJ SOLN
INTRAMUSCULAR | Status: DC | PRN
  Administered 2021-11-02: 8 mg via INTRAVENOUS

## 2021-11-02 MED ORDER — ONDANSETRON HCL 4 MG/2ML IJ SOLN: 4.0000 mg | Freq: Four times a day (QID) | INTRAMUSCULAR | Status: DC | PRN

## 2021-11-02 MED ORDER — INSULIN ASPART 100 UNIT/ML IJ SOLN
22.0000 [IU] | Freq: Three times a day (TID) | INTRAMUSCULAR | Status: DC
  Administered 2021-11-02 – 2021-11-04 (×5): 22 [IU] via SUBCUTANEOUS

## 2021-11-02 MED ORDER — MENTHOL 3 MG MT LOZG
1.0000 | LOZENGE | OROMUCOSAL | Status: DC | PRN
  Filled 2021-11-02: qty 9

## 2021-11-02 MED ORDER — DEXTROSE 50 % IV SOLN
INTRAVENOUS | Status: AC
  Administered 2021-11-02: 25 g via INTRAVENOUS
  Filled 2021-11-02: qty 50

## 2021-11-02 MED ORDER — PHENYLEPHRINE 40 MCG/ML (10ML) SYRINGE FOR IV PUSH (FOR BLOOD PRESSURE SUPPORT)
PREFILLED_SYRINGE | INTRAVENOUS | Status: AC
  Filled 2021-11-02: qty 10

## 2021-11-02 MED ORDER — PROPOFOL 10 MG/ML IV BOLUS
INTRAVENOUS | Status: DC | PRN
  Administered 2021-11-02: 140 mg via INTRAVENOUS

## 2021-11-02 MED ORDER — METHOCARBAMOL 500 MG PO TABS
500.0000 mg | ORAL_TABLET | Freq: Four times a day (QID) | ORAL | Status: DC | PRN
  Administered 2021-11-02 – 2021-11-04 (×6): 500 mg via ORAL
  Filled 2021-11-02 (×6): qty 1

## 2021-11-02 MED ORDER — OXYCODONE HCL 5 MG PO TABS
10.0000 mg | ORAL_TABLET | ORAL | Status: DC | PRN
  Administered 2021-11-02 – 2021-11-04 (×10): 10 mg via ORAL
  Filled 2021-11-02 (×10): qty 2

## 2021-11-02 MED ORDER — HYDROCODONE-ACETAMINOPHEN 5-325 MG PO TABS: 1.0000 | ORAL_TABLET | ORAL | Status: DC | PRN

## 2021-11-02 MED ORDER — DOCUSATE SODIUM 100 MG PO CAPS
100.0000 mg | ORAL_CAPSULE | Freq: Two times a day (BID) | ORAL | Status: DC
  Administered 2021-11-02 – 2021-11-04 (×4): 100 mg via ORAL
  Filled 2021-11-02 (×4): qty 1

## 2021-11-02 MED ORDER — ROSUVASTATIN CALCIUM 20 MG PO TABS
20.0000 mg | ORAL_TABLET | Freq: Every day | ORAL | Status: DC
  Administered 2021-11-03 – 2021-11-04 (×2): 20 mg via ORAL
  Filled 2021-11-02 (×2): qty 1

## 2021-11-02 MED ORDER — 0.9 % SODIUM CHLORIDE (POUR BTL) OPTIME
TOPICAL | Status: DC | PRN
  Administered 2021-11-02: 1000 mL

## 2021-11-02 MED ORDER — SUGAMMADEX SODIUM 200 MG/2ML IV SOLN
INTRAVENOUS | Status: DC | PRN
  Administered 2021-11-02: 200 mg via INTRAVENOUS

## 2021-11-02 MED ORDER — SODIUM CHLORIDE 0.9% FLUSH
3.0000 mL | INTRAVENOUS | Status: DC | PRN
Start: 2021-11-02 — End: 2021-11-04

## 2021-11-02 MED ORDER — THROMBIN 5000 UNITS EX SOLR
CUTANEOUS | Status: DC | PRN
  Administered 2021-11-02 (×2): 5000 [IU] via TOPICAL

## 2021-11-02 MED ORDER — ONDANSETRON HCL 4 MG/2ML IJ SOLN
INTRAMUSCULAR | Status: AC
  Filled 2021-11-02: qty 4

## 2021-11-02 MED ORDER — DEXAMETHASONE SODIUM PHOSPHATE 10 MG/ML IJ SOLN
INTRAMUSCULAR | Status: AC
  Filled 2021-11-02: qty 2

## 2021-11-02 MED ORDER — ORAL CARE MOUTH RINSE: 15.0000 mL | Freq: Once | OROMUCOSAL | Status: AC

## 2021-11-02 MED ORDER — HYDROMORPHONE HCL 1 MG/ML IJ SOLN: 1.0000 mg | INTRAMUSCULAR | Status: DC | PRN

## 2021-11-02 MED ORDER — MIDAZOLAM HCL 2 MG/2ML IJ SOLN
INTRAMUSCULAR | Status: AC
  Filled 2021-11-02: qty 2

## 2021-11-02 MED ORDER — OXYCODONE HCL 5 MG/5ML PO SOLN: 5.0000 mg | Freq: Once | ORAL | Status: DC | PRN

## 2021-11-02 MED ORDER — LIDOCAINE 2% (20 MG/ML) 5 ML SYRINGE
INTRAMUSCULAR | Status: AC
  Filled 2021-11-02: qty 5

## 2021-11-02 MED ORDER — CEFAZOLIN SODIUM-DEXTROSE 2-4 GM/100ML-% IV SOLN
2.0000 g | Freq: Three times a day (TID) | INTRAVENOUS | Status: AC
  Administered 2021-11-02 – 2021-11-03 (×2): 2 g via INTRAVENOUS
  Filled 2021-11-02 (×2): qty 100

## 2021-11-02 MED ORDER — MECLIZINE HCL 25 MG PO TABS
25.0000 mg | ORAL_TABLET | Freq: Three times a day (TID) | ORAL | Status: DC | PRN
  Filled 2021-11-02: qty 1

## 2021-11-02 MED ORDER — FENTANYL CITRATE (PF) 250 MCG/5ML IJ SOLN
INTRAMUSCULAR | Status: AC
  Filled 2021-11-02: qty 5

## 2021-11-02 MED ORDER — OXYCODONE HCL 5 MG PO TABS: 5.0000 mg | ORAL_TABLET | Freq: Once | ORAL | Status: DC | PRN

## 2021-11-02 MED ORDER — PROPOFOL 10 MG/ML IV BOLUS
INTRAVENOUS | Status: AC
  Filled 2021-11-02: qty 20

## 2021-11-02 MED ORDER — SODIUM CHLORIDE 0.9 % IV SOLN
250.0000 mL | INTRAVENOUS | Status: DC
  Administered 2021-11-02: 250 mL via INTRAVENOUS

## 2021-11-02 MED ORDER — ONDANSETRON HCL 4 MG PO TABS: 4.0000 mg | ORAL_TABLET | Freq: Four times a day (QID) | ORAL | Status: DC | PRN

## 2021-11-02 MED ORDER — PHENYLEPHRINE HCL-NACL 20-0.9 MG/250ML-% IV SOLN
INTRAVENOUS | Status: DC | PRN
  Administered 2021-11-02: 50 ug/min via INTRAVENOUS

## 2021-11-02 SURGICAL SUPPLY — 64 items
APL SKNCLS STERI-STRIP NONHPOA (GAUZE/BANDAGES/DRESSINGS)
BAG COUNTER SPONGE SURGICOUNT (BAG) ×3 IMPLANT
BAG SPNG CNTER NS LX DISP (BAG) ×1
BAND INSRT 18 STRL LF DISP RB (MISCELLANEOUS) ×2
BAND RUBBER #18 3X1/16 STRL (MISCELLANEOUS) ×6 IMPLANT
BENZOIN TINCTURE PRP APPL 2/3 (GAUZE/BANDAGES/DRESSINGS) IMPLANT
BIT DRILL NEURO 2X3.1 SFT TUCH (MISCELLANEOUS) ×2 IMPLANT
BLADE CLIPPER SURG (BLADE) IMPLANT
BUR CARBIDE MATCH 3.0 (BURR) ×3 IMPLANT
CANISTER SUCT 3000ML PPV (MISCELLANEOUS) ×3 IMPLANT
CARTRIDGE OIL MAESTRO DRILL (MISCELLANEOUS) ×2 IMPLANT
CLSR STERI-STRIP ANTIMIC 1/2X4 (GAUZE/BANDAGES/DRESSINGS) ×1 IMPLANT
COVER MAYO STAND STRL (DRAPES) ×6 IMPLANT
DIFFUSER DRILL AIR PNEUMATIC (MISCELLANEOUS) ×3 IMPLANT
DRAPE C-ARM 42X72 X-RAY (DRAPES) ×3 IMPLANT
DRAPE HALF SHEET 40X57 (DRAPES) ×1 IMPLANT
DRAPE LAPAROTOMY 100X72X124 (DRAPES) ×3 IMPLANT
DRAPE MICROSCOPE LEICA (MISCELLANEOUS) ×3 IMPLANT
DRILL NEURO 2X3.1 SOFT TOUCH (MISCELLANEOUS) ×2
DRSG OPSITE POSTOP 4X6 (GAUZE/BANDAGES/DRESSINGS) ×1 IMPLANT
DURAPREP 6ML APPLICATOR 50/CS (WOUND CARE) ×3 IMPLANT
ELECT COATED BLADE 2.86 ST (ELECTRODE) ×3 IMPLANT
ELECT REM PT RETURN 9FT ADLT (ELECTROSURGICAL) ×2
ELECTRODE REM PT RTRN 9FT ADLT (ELECTROSURGICAL) ×2 IMPLANT
EVACUATOR 1/8 PVC DRAIN (DRAIN) ×1 IMPLANT
GAUZE 4X4 16PLY ~~LOC~~+RFID DBL (SPONGE) ×1 IMPLANT
GLOVE EXAM NITRILE LRG STRL (GLOVE) IMPLANT
GLOVE EXAM NITRILE XL STR (GLOVE) IMPLANT
GLOVE EXAM NITRILE XS STR PU (GLOVE) IMPLANT
GLOVE SRG 8 PF TXTR STRL LF DI (GLOVE) ×2 IMPLANT
GLOVE SURG ENC MOIS LTX SZ8 (GLOVE) ×5 IMPLANT
GLOVE SURG LTX SZ8 (GLOVE) ×7 IMPLANT
GLOVE SURG UNDER POLY LF SZ8 (GLOVE) ×4
GLOVE SURG UNDER POLY LF SZ8.5 (GLOVE) ×4 IMPLANT
GOWN STRL REUS W/ TWL LRG LVL3 (GOWN DISPOSABLE) IMPLANT
GOWN STRL REUS W/ TWL XL LVL3 (GOWN DISPOSABLE) ×4 IMPLANT
GOWN STRL REUS W/TWL 2XL LVL3 (GOWN DISPOSABLE) IMPLANT
GOWN STRL REUS W/TWL LRG LVL3 (GOWN DISPOSABLE) ×4
GOWN STRL REUS W/TWL XL LVL3 (GOWN DISPOSABLE) ×4
HEMOSTAT POWDER KIT SURGIFOAM (HEMOSTASIS) ×3 IMPLANT
KIT BASIN OR (CUSTOM PROCEDURE TRAY) ×3 IMPLANT
KIT TURNOVER KIT B (KITS) ×3 IMPLANT
NDL SPNL 18GX3.5 QUINCKE PK (NEEDLE) ×2 IMPLANT
NEEDLE HYPO 22GX1.5 SAFETY (NEEDLE) ×3 IMPLANT
NEEDLE SPNL 18GX3.5 QUINCKE PK (NEEDLE) ×2 IMPLANT
NS IRRIG 1000ML POUR BTL (IV SOLUTION) ×3 IMPLANT
OIL CARTRIDGE MAESTRO DRILL (MISCELLANEOUS) ×2
PACK LAMINECTOMY NEURO (CUSTOM PROCEDURE TRAY) ×3 IMPLANT
PAD ARMBOARD 7.5X6 YLW CONV (MISCELLANEOUS) ×9 IMPLANT
PIN DISTRACTION 14MM (PIN) ×6 IMPLANT
PLATE CERV OZARK 51 3LVL (Plate) ×1 IMPLANT
PUTTY BONE 100 VESUVIUS 2.5CC (Putty) ×1 IMPLANT
SCREW FIXED ST OZARK 4X16 (Screw) ×2 IMPLANT
SCREW VA ST OZARK 4X16 (Plate) ×6 IMPLANT
SPACER LORD CASCAD 13X16X6 7D (Spacer) ×3 IMPLANT
SPONGE INTESTINAL PEANUT (DISPOSABLE) ×3 IMPLANT
SPONGE SURGIFOAM ABS GEL SZ50 (HEMOSTASIS) ×4 IMPLANT
STAPLER VISISTAT 35W (STAPLE) ×1 IMPLANT
STRIP CLOSURE SKIN 1/2X4 (GAUZE/BANDAGES/DRESSINGS) ×3 IMPLANT
TAPE SURG TRANSPORE 1 IN (GAUZE/BANDAGES/DRESSINGS) ×2 IMPLANT
TAPE SURGICAL TRANSPORE 1 IN (GAUZE/BANDAGES/DRESSINGS) ×2
TOWEL GREEN STERILE (TOWEL DISPOSABLE) ×3 IMPLANT
TOWEL GREEN STERILE FF (TOWEL DISPOSABLE) ×3 IMPLANT
WATER STERILE IRR 1000ML POUR (IV SOLUTION) ×3 IMPLANT

## 2021-11-02 NOTE — Op Note (Signed)
? ?Providing Compassionate, Quality Care - Together ? ?Date of service: 11/02/2021 ? ?PREOP DIAGNOSIS: Cervical spondylosis, stenosis with myelopathy, C4-5, C5-6, C6-7 with cord signal change ? ?POSTOP DIAGNOSIS: Same ? ?PROCEDURE: ?1. Arthrodesis C4-5, C5-6, C6-7, anterior interbody technique  ?2. Placement of intervertebral biomechanical device C4-5, C5-6, C6-7; K2 M C4-5: 6 x 13 x 16 mm titanium [place]ia interbody, C6-7: 6 x 13 x 16 mm titanium C[place]adia interbody, C6-7: 6 x 13 x 16 mm titanium [place]ia interbody, ?3. Placement of anterior instrumentation consisting of interbody plate and screws -K2 M Ozark plate, 51 mm, 16 x 4.0 mm screws bilaterally at C4, C5, C6, C7 ?4. Discectomy at C4-5, C5-6, C6-7 for decompression of spinal cord and exiting nerve roots  ?5. Use of morselized bone allograft  ?6.  Use of morselized autograft, same incision ?7. Use of intraoperative microscope ? ?SURGEON: Dr. Pieter Partridge Slayton Lubitz, DO ? ?ASSISTANT: Weston Brass, NP ? ?ANESTHESIA: General Endotracheal ? ?EBL: 100 cc ? ?SPECIMENS: None ? ?DRAINS: None ? ?COMPLICATIONS: None immediate ? ?CONDITION: Hemodynamically stable to PACU ? ?HISTORY: ?George Franco is a 66 y.o. y.o. male who initially presented to the outpatient clinic with signs and symptoms consistent with myelopathy.  He complained of difficulty walking, fine motor movement as well as numbness and tingling that was progressively worsening over the past year.  He was seen by neurology in which MRI cervical spine was obtained. MRI demonstrated severe stenosis with cord signal change and chronic myelomalacia at C4-5 and C6-7, with moderate to severe stenosis at C5-6.  There is also significant bilateral neuroforaminal narrowing at C4-5, C5-6, C6-7. Treatment options were discussed including observation, physical therapy and surgical decompression, instrumentation and fusion.  I recommended surgical decompression as administration and fusion in the form of an ACDF C4-7 due to his  progressive myelopathic symptoms and consistent physical exam. After all questions were answered, informed consent was obtained. ? ?PROCEDURE IN DETAIL: ?The patient was brought to the operating room and transferred to the operative table. After induction of general anesthesia, the patient was positioned on the operative table in the supine position with all pressure points meticulously padded. The skin of the neck was then prepped and draped in the usual sterile fashion.  Physician driven timeout was performed. ? ?After timeout was conducted, skin incision was then made sharply with a 10 blade and Bovie electrocautery was used to dissect the subcutaneous tissue until the platysma was identified. The platysma was then divided and undermined. The sternocleidomastoid muscle was then identified and, utilizing natural fascial planes in the neck, the prevertebral fascia was identified and the carotid sheath was retracted laterally and the trachea and esophagus retracted medially. Again using fluoroscopy, the correct disc space was identified. Bovie electrocautery was used to dissect in the subperiosteal plane and elevate the bilateral longus coli muscles. Self-retaining retractors were then placed under the longus coli muscles bilaterally. At this point, the microscope was draped and brought into the field, and the remainder of the case was done under the microscope using microdissecting technique. ? ?ACDF C4-5: Distraction pins were placed in midline above and below the disc space.  The disc  space was placed in distraction.  The disc space was incised sharply and rongeurs were use to initially complete a discectomy. The high-speed drill was then used to complete discectomy until the posterior annulus was identified and removed and the posterior longitudinal ligament was identified. Using microcurettes, the PLL was elevated, and Kerrison rongeurs were used to remove the posterior  longitudinal ligament and the ventral  thecal sac was identified. Using a combination of curettes and ronguers, complete decompression of the thecal sac and exiting nerve roots at this level was completed, and verified using micro-nerve hook. The disc space was taken out of distraction.  Epidural hemostasis was achieved with Surgiflo. ? ?Having completed our decompression, attention was turned to placement of the intervertebral device. Trial spacers were used to select a 6 mm graft. This graft was then filled with morcellized autograft and allograft, and inserted under live fluoroscopy. ? ?ACDF C5-6: Distraction pins were placed in midline above and below the disc space.  The disc  space was placed in distraction.  The disc space was incised sharply and rongeurs were use to initially complete a discectomy. The high-speed drill was then used to complete discectomy until the posterior annulus was identified and removed and the posterior longitudinal ligament was identified. Using microcurettes, the PLL was elevated, and Kerrison rongeurs were used to remove the posterior longitudinal ligament and the ventral thecal sac was identified. Using a combination of curettes and ronguers, complete decompression of the thecal sac and exiting nerve roots at this level was completed, and verified using micro-nerve hook. The disc space was taken out of distraction.  Epidural hemostasis was achieved with Surgiflo. ? ?Having completed our decompression, attention was turned to placement of the intervertebral device. Trial spacers were used to select a 6 mm graft. This graft was then filled with morcellized autograft and allograft, and inserted under live fluoroscopy. ? ?ACDF C6-7: Distraction pins were placed in midline above and below the disc space.  The disc  space was placed in distraction.  The disc space was incised sharply and rongeurs were use to initially complete a discectomy. The high-speed drill was then used to complete discectomy until the posterior annulus  was identified and removed and the posterior longitudinal ligament was identified. Using microcurettes, the PLL was elevated, and Kerrison rongeurs were used to remove the posterior longitudinal ligament and the ventral thecal sac was identified. Using a combination of curettes and ronguers, complete decompression of the thecal sac and exiting nerve roots at this level was completed, and verified using micro-nerve hook. The disc space was taken out of distraction.  Epidural hemostasis was achieved with Surgiflo.  There was significant foraminal narrowing bilaterally at this level. ? ?Having completed our decompression, attention was turned to placement of the intervertebral device. Trial spacers were used to select a 6 mm graft. This graft was then filled with morcellized autograft and allograft, and inserted under live fluoroscopy. ? ?After placement of the intervertebral device, the above anterior cervical plate was selected, and placed across the interspace, 16 x 4.0 mm screws bilaterally at C4, C5, C6, C7. Using a high-speed drill, the cortex of the cervical vertebral bodies was punctured, and screws inserted with appropriate bony purchase. Final fluoroscopic images in AP and lateral projections were taken to confirm good hardware placement.  The plate was final tightened to the manufacturer's recommendation and the screws were locked in place. ? ?At this point, after all counts were verified to be correct, meticulous hemostasis was secured using a combination of bipolar electrocautery and passive hemostatics.  Skin was closed with staples.  A medium Hemovac was placed in the prevertebral space and tunneled laterally.  Sterile dressing was applied. ? ?The patient tolerated the procedure well and was extubated in the room and taken to the postanesthesia care unit in stable condition. ? ?

## 2021-11-02 NOTE — Anesthesia Procedure Notes (Signed)
Procedure Name: Intubation ?Date/Time: 11/02/2021 11:38 AM ?Performed by: Mariea Clonts, CRNA ?Pre-anesthesia Checklist: Patient identified, Emergency Drugs available, Suction available and Patient being monitored ?Patient Re-evaluated:Patient Re-evaluated prior to induction ?Oxygen Delivery Method: Circle System Utilized ?Preoxygenation: Pre-oxygenation with 100% oxygen ?Induction Type: IV induction ?Ventilation: Mask ventilation without difficulty and Oral airway inserted - appropriate to patient size ?Laryngoscope Size: Sabra Heck and 2 ?Grade View: Grade II ?Tube type: Oral ?Tube size: 7.5 mm ?Number of attempts: 1 ?Airway Equipment and Method: Stylet and Oral airway ?Placement Confirmation: ETT inserted through vocal cords under direct vision, positive ETCO2 and breath sounds checked- equal and bilateral ?Tube secured with: Tape ?Dental Injury: Teeth and Oropharynx as per pre-operative assessment  ? ? ? ? ?

## 2021-11-02 NOTE — Anesthesia Preprocedure Evaluation (Signed)
Anesthesia Evaluation  ?Patient identified by MRN, date of birth, ID band ?Patient awake ? ? ? ?Reviewed: ?Allergy & Precautions, NPO status , Patient's Chart, lab work & pertinent test results ? ?History of Anesthesia Complications ?Negative for: history of anesthetic complications ? ?Airway ?Mallampati: II ? ?TM Distance: >3 FB ?Neck ROM: Full ? ? ? Dental ? ?(+) Dental Advisory Given ?  ?Pulmonary ?Current Smoker and Patient abstained from smoking.,  ?  ?Pulmonary exam normal ? ? ? ? ? ? ? Cardiovascular ?hypertension, Pt. on medications ?Normal cardiovascular exam ? ? ?  ?Neuro/Psych ?CVA   ? GI/Hepatic ?negative GI ROS, Neg liver ROS,   ?Endo/Other  ?diabetes, Type 2, Insulin Dependent, Oral Hypoglycemic Agents ? Renal/GU ?negative Renal ROS  ?negative genitourinary ?  ?Musculoskeletal ?negative musculoskeletal ROS ?(+)  ? Abdominal ?  ?Peds ? Hematology ? ?(+) Blood dyscrasia, anemia ,   ?Anesthesia Other Findings ? ? Reproductive/Obstetrics ? ?  ? ? ? ? ? ? ? ? ? ? ? ? ? ?  ?  ? ? ? ? ? ? ? ? ?Anesthesia Physical ?Anesthesia Plan ? ?ASA: 3 ? ?Anesthesia Plan: General  ? ?Post-op Pain Management: Tylenol PO (pre-op)* and Toradol IV (intra-op)*  ? ?Induction: Intravenous ? ?PONV Risk Score and Plan: 1 and Ondansetron, Dexamethasone, Treatment may vary due to age or medical condition and Midazolam ? ?Airway Management Planned: Oral ETT ? ?Additional Equipment: None ? ?Intra-op Plan:  ? ?Post-operative Plan: Extubation in OR ? ?Informed Consent: I have reviewed the patients History and Physical, chart, labs and discussed the procedure including the risks, benefits and alternatives for the proposed anesthesia with the patient or authorized representative who has indicated his/her understanding and acceptance.  ? ? ? ?Dental advisory given ? ?Plan Discussed with:  ? ?Anesthesia Plan Comments:   ? ? ? ? ? ? ?Anesthesia Quick Evaluation ? ?

## 2021-11-02 NOTE — Anesthesia Postprocedure Evaluation (Signed)
Anesthesia Post Note ? ?Patient: George Franco ? ?Procedure(s) Performed: Anterior Cervical Decompression Fusion, Cervical four-five, Cervical five-six, Cervical six-seven (Spine Cervical) ? ?  ? ?Patient location during evaluation: PACU ?Anesthesia Type: General ?Level of consciousness: sedated ?Pain management: pain level controlled ?Vital Signs Assessment: post-procedure vital signs reviewed and stable ?Respiratory status: spontaneous breathing and respiratory function stable ?Cardiovascular status: stable ?Postop Assessment: no apparent nausea or vomiting ?Anesthetic complications: no ? ? ?No notable events documented. ? ?Last Vitals:  ?Vitals:  ? 11/02/21 1530 11/02/21 1600  ?BP: 132/60 131/65  ?Pulse: 62 68  ?Resp: 12 17  ?Temp: 37 ?C   ?SpO2: 100% 97%  ?  ?Last Pain:  ?Vitals:  ? 11/02/21 1530  ?TempSrc:   ?PainSc: Asleep  ? ? ?  ?  ?  ?  ?  ?  ? ?Merlinda Frederick ? ? ? ? ?

## 2021-11-02 NOTE — H&P (Signed)
? ? ?Providing Compassionate, Quality Care - Together ? ?NEUROSURGERY HISTORY & PHYSICAL ? ? ?George Franco is an 66 y.o. male.   ?Chief Complaint: Numbness and tingling ?HPI: This is a 66 year old male with a history of CVA, with ongoing gait disturbance, numbness and tingling in his hands and feet for greater than 1 year.  He has had difficulty walking that been progressive over this time.  He complains of difficulty with fine motor movement such but also progressing over this time.  MRI of the cervical spine revealed severe stenosis with cord signal change C4-7, therefore he was referred to my office and presents today for surgical decompression instrumentation and fusion.  He received medical clearance, has held his aspirin for greater than 1 week. ? ?Past Medical History:  ?Diagnosis Date  ? At risk for sleep apnea   ? STOP-BANG= 5     SENT TO PCP 06-02-2015  ? BPH (benign prostatic hyperplasia)   ? CTS (carpal tunnel syndrome)   ? CTS (carpal tunnel syndrome)   ? ED (erectile dysfunction) of organic origin   ? Hyperlipidemia   ? Hypertension   ? Hypogonadism in male   ? Knee pain   ? pt said he doesn't know anything about this  ? Lower urinary tract symptoms (LUTS)   ? LVH (left ventricular hypertrophy)   ? Persistent vertigo of central origin   ? Prostate cancer [facility]) urologist-  dr ottelin/  oncologist-  [name]  ? Stage T1c, Gleason 4+3, PSA 4.48,  vol 32.9cc--  External RXT complete 05-08-2015  ? Stroke [facility])   ? Type 2 diabetes mellitus ([place])   ? Vestibular neuropathy   ? Wears glasses   ? ? ?Past Surgical History:  ?Procedure Laterality Date  ? COLONOSCOPY    ? IR ANGIOGRAM EXTREMITY RIGHT  03/05/2020  ? IR FEM POP ART PTA MOD SED  03/05/2020  ? IR RADIOLOGIST EVAL & MGMT  02/26/2020  ? IR RADIOLOGIST EVAL & MGMT  04/15/2020  ? IR RADIOLOGIST EVAL & MGMT  07/22/2020  ? IR TIB-PERO ART ATHEREC INC PTA MOD SED  03/05/2020  ? IR US GUIDE VASC ACCESS RIGHT  03/05/2020  ? PROSTATE BIOPSY    ? RADIOACTIVE SEED  IMPLANT N/A 06/05/2015  ? Procedure: RADIOACTIVE SEED IMPLANT/BRACHYTHERAPY IMPLANT;  Surgeon: [name], MD;  [facility];  Service: Urology;  Laterality: N/A;  ? ROTATOR CUFF REPAIR Right 2014  ? UMBILICAL HERNIA REPAIR  1990's  ? UPPER GASTROINTESTINAL ENDOSCOPY    ? wisdom teeth extrat    ? ? ?Family History  ?Problem Relation Age of Onset  ? Prostate cancer Father   ? Diabetes Father   ? Diabetes Mother   ? Diabetes Brother   ? Colon cancer Neg Hx   ? Esophageal cancer Neg Hx   ? Liver cancer Neg Hx   ? Pancreatic cancer Neg Hx   ? Rectal cancer Neg Hx   ? Stomach cancer Neg Hx   ? ?Social History:  reports that he has been smoking cigarettes. He has a 10.00 pack-year smoking history. He has never used smokeless tobacco. He reports current alcohol use of about 2.0 standard drinks per week. He reports that he does not use drugs. ? ?Allergies: No Known Allergies ? ?Facility-Administered Medications Prior to Admission  ?Medication Dose Route Frequency Provider Last Rate Last Admin  ? 0.9 %  sodium chloride infusion  500 mL Intravenous Once Jackquline Denmark, MD      ? ?  Medications Prior to Admission  ?Medication Sig Dispense Refill  ? aspirin EC 81 MG tablet Take 1 tablet (81 mg total) by mouth daily. 30 tablet 0  ? benazepril (LOTENSIN) 40 MG tablet Take 40 mg by mouth daily.    ? citalopram (CELEXA) 10 MG tablet Take 10 mg by mouth daily.    ? ezetimibe (ZETIA) 10 MG tablet Take 10 mg by mouth daily.    ? FARXIGA 10 MG TABS tablet Take 10 mg by mouth daily.    ? Insulin Glargine (BASAGLAR KWIKPEN) 100 UNIT/ML Inject 26 Units into the skin daily.    ? NOVOLOG FLEXPEN 100 UNIT/ML FlexPen Inject 22 Units into the skin 3 (three) times daily with meals.  6  ? rosuvastatin (CRESTOR) 20 MG tablet Take 20 mg by mouth daily.    ? loperamide (IMODIUM) 2 MG capsule Take 2 mg by mouth every 6 (six) hours as needed for diarrhea or loose stools.  0  ? meclizine (ANTIVERT) 25 MG tablet Take 1 tablet (25  mg total) by mouth 3 (three) times daily as needed for dizziness. 30 tablet 2  ? ONETOUCH VERIO test strip USE TO TEST BLOOD SUGAR 3 TIMES A DAY PRIOR TO MEALS AND RECORD    ? ? ?Results for orders placed or performed during the hospital encounter of 11/02/21 (from the past 48 hour(s))  ?ABO/Rh     Status: None (Preliminary result)  ? Collection Time: 11/02/21  5:00 AM  ?Result Value Ref Range  ? ABO/RH(D) PENDING   ?Glucose, capillary     Status: Abnormal  ? Collection Time: 11/02/21  9:22 AM  ?Result Value Ref Range  ? Glucose-Capillary 124 (H) 70 - 99 mg/dL  ?  Comment: Glucose reference range applies only to samples taken after fasting for at least 8 hours.  ? ?No results found. ? ?ROS ?All pertinent positives and negatives are listed in HPI above ? ?Blood pressure 138/66, pulse 73, temperature 97.9 ?F (36.6 ?C), temperature source Oral, resp. rate 17, height '5\' 9"'$  (1.753 m), weight 82.6 kg, SpO2 96 %. ?Physical Exam  ?Awake alert oriented, x3 ?PERRLA ?Cranial nerves II through XII intact ?Bilateral upper/lower extremity 4+/5 throughout ?Deep tendon reflexes 3/4 throughout upper and lower extremities ?Positive Hoffmann bilaterally ? ? ?Assessment/Plan ?66 year old male with ? ?Cervical spondylotic myelopathy, C4-7 ? ?-OR today for ACDF C4-7.  We discussed all risks, benefits expected outcomes as well as alternatives to treatment.  He agreed to proceed, informed consent was obtained.  I answered all of his questions. ? ?Thank you for allowing me to participate in this patient's care.  Please do not hesitate to call with questions or concerns. ? ? ?Niasia Lanphear, DO ?Neurosurgeon ?[facility] ?Cell: [phone] ? ? ? ? ?

## 2021-11-02 NOTE — Transfer of Care (Signed)
Immediate Anesthesia Transfer of Care Note ? ?Patient: George Franco ? ?Procedure(s) Performed: Anterior Cervical Decompression Fusion, Cervical four-five, Cervical five-six, Cervical six-seven (Spine Cervical) ? ?Patient Location: PACU ? ?Anesthesia Type:General ? ?Level of Consciousness: awake, alert  and oriented ? ?Airway & Oxygen Therapy: Patient Spontanous Breathing and Patient connected to nasal cannula oxygen ? ?Post-op Assessment: Report given to RN, Post -op Vital signs reviewed and stable and Patient moving all extremities X 4 ? ?Post vital signs: Reviewed and stable ? ?Last Vitals:  ?Vitals Value Taken Time  ?BP 182/70 11/02/21 1427  ?Temp    ?Pulse 66 11/02/21 1428  ?Resp 15 11/02/21 1428  ?SpO2 100 % 11/02/21 1428  ?Vitals shown include unvalidated device data. ? ?Last Pain:  ?Vitals:  ? 11/02/21 0945  ?TempSrc:   ?PainSc: 0-No pain  ?   ? ?Patients Stated Pain Goal: 3 (11/02/21 0945) ? ?Complications: No notable events documented. ?

## 2021-11-02 NOTE — Progress Notes (Signed)
Orthopedic Tech Progress Note ?Patient Details:  ?George Franco ?March 11, 1956 ?[id number] ? ?Called in order to [employer] for an [place]  ? ?Patient ID: [name], male   DOB: 1956-07-22, 66 y.o.   MRN: [id number] ? ?Janit Pagan ?11/02/2021, 3:31 PM ? ?

## 2021-11-03 ENCOUNTER — Encounter (HOSPITAL_COMMUNITY): Payer: Self-pay | Admitting: Neurological Surgery

## 2021-11-03 LAB — GLUCOSE, CAPILLARY
Glucose-Capillary: 151 mg/dL — ABNORMAL HIGH (ref 70–99)
Glucose-Capillary: 127 mg/dL — ABNORMAL HIGH (ref 70–99)
Glucose-Capillary: 90 mg/dL (ref 70–99)
Glucose-Capillary: 124 mg/dL — ABNORMAL HIGH (ref 70–99)

## 2021-11-03 NOTE — TOC Initial Note (Signed)
Transition of Care (TOC) - Initial/Assessment Note  ? ? ?Patient Details  ?Name: George Franco ?MRN: [id number] ?Date of Birth: 1955-10-11 ? ?Transition of Care (TOC) CM/SW Contact:    ?Marilu Favre, RN ?Phone Number: ?11/03/2021, 2:52 PM ? ?Clinical Narrative:                 ?Spoke to patient and wife at bedside. Confirmed face sheet information.  ? ?Discussed home health PT/OT both in agreement. No preference on agency.  ? ?Wife requests agency call her one hour in advance to arriving for visit and she will contain her dog in a spare room.  ? ?Referral given to and accepted by [facility] with [facility]. Hoyle Sauer aware of dog .  ? ?3C staff will provide any needed DME  ? ?Expected Discharge Plan: [place] ?Barriers to Discharge: Continued Medical Work up ? ? ?Patient Goals and CMS Choice ?Patient states their goals for this hospitalization and ongoing recovery are:: to return to home ?CMS Medicare.gov Compare Post Acute Care list provided to:: Patient ?Choice offered to / list presented to : Patient ? ?Expected Discharge Plan and Services ?Expected Discharge Plan: [place] ?  ?Discharge Planning Services: CM Consult ?Post Acute Care Choice: Home Health ?Living arrangements for the past 2 months: [place] ?                ?  ?  ?  ?  ?  ?HH Arranged: PT, OT ?[place] Agency: Other - See comment (medi home health) ?  ?  ?Representative spoke with at [place]: Hoyle Sauer ? ?Prior Living Arrangements/Services ?Living arrangements for the past 2 months: [place] ?Lives with:: Spouse ?Patient language and need for interpreter reviewed:: Yes ?Do you feel safe going back to the place where you live?: Yes      ?Need for Family Participation in Patient Care: Yes (Comment) ?Care giver support system in place?: Yes (comment) ?  ?Criminal Activity/Legal Involvement Pertinent to Current Situation/Hospitalization: No - Comment as needed ? ?Activities of Daily Living ?  ?   ? ?Permission Sought/Granted ?  ?Permission granted to share information with : Yes, Verbal Permission Granted ? Share Information with NAME: Forbes Cellar spouse ?   ?   ?   ? ?Emotional Assessment ?Appearance:: Appears stated age ?Attitude/Demeanor/Rapport: Engaged ?Affect (typically observed): Accepting ?Orientation: : Oriented to Self, Oriented to Place, Oriented to  Time, Oriented to Situation ?Alcohol / Substance Use: Not Applicable ?Psych Involvement: No (comment) ? ?Admission diagnosis:  Cervical myelopathy ([place]) [G95.9] ?Patient Active Problem List  ? Diagnosis Date Noted  ? Cervical myelopathy ([place]) 11/02/2021  ? Lacunar stroke ([place]) 06/05/2019  ? BPPV (benign paroxysmal positional vertigo) 05/13/2019  ? Abnormal CT scan of lung - RLL ground glass density 01/26/2018  ? Malignant neoplasm of prostate ([place]) 01/26/2015  ? Dizziness and giddiness 11/18/2014  ? Essential hypertension, benign 11/18/2014  ? ?PCP:  [name], MD ?Pharmacy:   ?[id number] [place] [place]?[id number]?Phone: [phone]: [phone]? ? ? ? ?Social Determinants of Health (SDOH) Interventions ?  ? ?Readmission Risk Interventions ?   ? View : No data to display.  ?  ?  ?  ? ? ? ?

## 2021-11-03 NOTE — Evaluation (Signed)
Physical Therapy Evaluation ?Patient Details ?Name: George Franco ?MRN: [id number] ?DOB: 1955-12-09 ?Today's Date: 11/03/2021 ? ?History of Present Illness ? Pt is a 66 y.o. M who presents s/p C4-7 ACDF 11/02/2021. Significant PMH: vertigo, stroke, DM2.  ?Clinical Impression ? PTA, pt lives with his spouse, is a limited household ambulator using a cane and has history of one fall in past 3 months. Pt presents with decreased functional mobility secondary to pain, gait abnormalities, decreased standing balance, decreased activity tolerance and weakness (particularly LLE). Pt requiring min guard assist for functional mobility. Ambulating 50 ft with a walker; displays short, shuffling steps, placing him at high risk for falls. Reviewed fall prevention techniques. Pt would benefit from follow up [place]. ?   ? ?Recommendations for follow up therapy are one component of a multi-disciplinary discharge planning process, led by the attending physician.  Recommendations may be updated based on patient status, additional functional criteria and insurance authorization. ? ?Follow Up Recommendations Home health PT ? ?  ?Assistance Recommended at Discharge Frequent or constant Supervision/Assistance  ?Patient can return home with the following ? A little help with walking and/or transfers;A little help with bathing/dressing/bathroom;Assistance with cooking/housework;Direct supervision/assist for medications management;Assist for transportation;Help with stairs or ramp for entrance ? ?  ?Equipment Recommendations None recommended by PT  ?Recommendations for Other Services ?    ?  ?Functional Status Assessment Patient has had a recent decline in their functional status and demonstrates the ability to make significant improvements in function in a reasonable and predictable amount of time.  ? ?  ?Precautions / Restrictions Precautions ?Precautions: Cervical;Fall ?Precaution Booklet Issued: Yes (comment) ?Precaution Comments: verbally  reviewed, provided written handout ?Required Braces or Orthoses: Cervical Brace ?Cervical Brace: Hard collar;At all times ?Restrictions ?Weight Bearing Restrictions: No  ? ?  ? ?Mobility ? Bed Mobility ?Overal bed mobility: Needs Assistance ?Bed Mobility: Rolling, Sidelying to Sit, Sit to Sidelying ?Rolling: Supervision ?Sidelying to sit: Supervision ?  ?  ?Sit to sidelying: Supervision ?General bed mobility comments: supervision for safety ?  ? ?Transfers ?Overall transfer level: Needs assistance ?Equipment used: Rolling walker (2 wheels) ?Transfers: Sit to/from Stand ?Sit to Stand: Min guard ?  ?  ?  ?  ?  ?General transfer comment: Min guard to rise to stand to walker ?  ? ?Ambulation/Gait ?Ambulation/Gait assistance: Min guard ?Gait Distance (Feet): 50 Feet ?Assistive device: Rolling walker (2 wheels) ?Gait Pattern/deviations: Step-to pattern, Decreased stride length, Shuffle ?Gait velocity: decreased ?Gait velocity interpretation: <1.8 ft/sec, indicate of risk for recurrent falls ?  ?General Gait Details: Shuffling gait pattern with decreased bilateral foot clearance; unable to correct with cues. close min guard assist for safety ? ?Stairs ?  ?  ?  ?  ?  ? ?Wheelchair Mobility ?  ? ?Modified Rankin (Stroke Patients Only) ?  ? ?  ? ?Balance Overall balance assessment: Needs assistance ?Sitting-balance support: Feet supported ?Sitting balance-Leahy Scale: Good ?  ?  ?Standing balance support: Bilateral upper extremity supported ?Standing balance-Leahy Scale: Poor ?Standing balance comment: reliant on RW ?  ?  ?  ?  ?  ?  ?  ?  ?  ?  ?  ?   ? ? ? ?Pertinent Vitals/Pain Pain Assessment ?Pain Assessment: Faces ?Faces Pain Scale: Hurts little more ?Pain Location: surgical site ?Pain Descriptors / Indicators: Discomfort, Grimacing, Operative site guarding ?Pain Intervention(s): Limited activity within patient's tolerance, Monitored during session  ? ? ?Home Living Family/patient expects to be discharged to:: Private  residence ?  Living Arrangements: Spouse/significant other ?Available Help at Discharge: Family;Available PRN/intermittently ?Type of Home: House ?Home Access: Stairs to enter ?  ?Entrance Stairs-Number of Steps: 5 ?  ?Home Layout: One level ?Home Equipment: Conservation officer, nature (2 wheels);Cane - single point ?   ?  ?Prior Function Prior Level of Function : Needs assist ?  ?  ?  ?  ?  ?  ?Mobility Comments: using cane, household ambulator, assist with stairs and car transfer. Hx of 1 fall in past 3 months ?ADLs Comments: assist for bathing, shower transfers ?  ? ? ?Hand Dominance  ?   ? ?  ?Extremity/Trunk Assessment  ? Upper Extremity Assessment ?Upper Extremity Assessment: Defer to OT evaluation ?  ? ?Lower Extremity Assessment ?Lower Extremity Assessment: RLE deficits/detail;LLE deficits/detail ?RLE Deficits / Details: Grossly 5/5 ?LLE Deficits / Details: Grossly 2/5 ?  ? ?Cervical / Trunk Assessment ?Cervical / Trunk Assessment: Neck Surgery  ?Communication  ? Communication: No difficulties  ?Cognition Arousal/Alertness: Awake/alert ?Behavior During Therapy: Flat affect ?Overall Cognitive Status: No family/caregiver present to determine baseline cognitive functioning ?  ?  ?  ?  ?  ?  ?  ?  ?  ?  ?  ?  ?  ?  ?  ?  ?General Comments: Poor historian. decreased awareness of deficits ?  ?  ? ?  ?General Comments   ? ?  ?Exercises    ? ?Assessment/Plan  ?  ?PT Assessment Patient needs continued PT services  ?PT Problem List Decreased strength;Decreased activity tolerance;Decreased balance;Decreased mobility;Decreased cognition;Decreased safety awareness;Pain ? ?   ?  ?PT Treatment Interventions DME instruction;Gait training;Functional mobility training;Stair training;Therapeutic exercise;Therapeutic activities;Balance training;Patient/family education   ? ?PT Goals (Current goals can be found in the Care Plan section)  ?Acute Rehab PT Goals ?Patient Stated Goal: return to baseline ?PT Goal Formulation: With patient ?Time For  Goal Achievement: 11/17/21 ?Potential to Achieve Goals: Good ? ?  ?Frequency Min 5X/week ?  ? ? ?Co-evaluation   ?  ?  ?  ?  ? ? ?  ?AM-PAC PT "6 Clicks" Mobility  ?Outcome Measure Help needed turning from your back to your side while in a flat bed without using bedrails?: A Little ?Help needed moving from lying on your back to sitting on the side of a flat bed without using bedrails?: A Little ?Help needed moving to and from a bed to a chair (including a wheelchair)?: A Little ?Help needed standing up from a chair using your arms (e.g., wheelchair or bedside chair)?: A Little ?Help needed to walk in hospital room?: A Little ?Help needed climbing 3-5 steps with a railing? : A Lot ?6 Click Score: 17 ? ?  ?End of Session Equipment Utilized During Treatment: Gait belt;Cervical collar ?Activity Tolerance: Patient tolerated treatment well ?Patient left: in bed;with call bell/phone within reach ?Nurse Communication: Mobility status ?PT Visit Diagnosis: Unsteadiness on feet (R26.81);Other abnormalities of gait and mobility (R26.89);Difficulty in walking, not elsewhere classified (R26.2);Pain ?Pain - part of body:  (neck) ?  ? ?Time: 1478-2956 ?PT Time Calculation (min) (ACUTE ONLY): 23 min ? ? ?Charges:   PT Evaluation ?$PT Eval Low Complexity: 1 Low ?PT Treatments ?$Gait Training: 8-22 mins ?  ?   ? ? ?Wyona Almas, PT, DPT ?Acute Rehabilitation Services ?Pager [phone] ?Office [phone] ? ? ?Deno Etienne ?11/03/2021, 11:22 AM ? ?

## 2021-11-03 NOTE — Progress Notes (Signed)
Subjective: ?Patient reports a significant amount of cervical pain and upper back/shoulder pain. No acute events overnight.  ? ?Objective: ?Vital signs in last 24 hours: ?Temp:  [97.5 ?F (36.4 ?C)-98.8 ?F (37.1 ?C)] 98.4 ?F (36.9 ?C) (03/29 0750) ?Pulse Rate:  [54-75] 75 (03/29 0750) ?Resp:  [12-20] 16 (03/29 0750) ?BP: (118-176)/(52-83) 142/73 (03/29 0750) ?SpO2:  [94 %-100 %] 99 % (03/29 0750) ?Weight:  [82.6 kg] 82.6 kg (03/28 0920) ? ?Intake/Output from previous day: ?03/28 0701 - 03/29 0700 ?In: 1500 [I.V.:1500] ?Out: 46 [Drains:50; Blood:25] ?Intake/Output this shift: ?No intake/output data recorded. ? ?Physical Exam: Patient is awake, A/O X 4, and conversant. They are in NAD and VSS.  Speech is fluent and appropriate. MAEW with good strength that is symmetric bilaterally. Sensation to light touch is intact. PERLA, EOMI. CNs grossly intact. Dressing is clean dry intact. Incision is well approximated with no drainage, erythema, or edema. Hard cervical collar in place ? ? ? ? ? ?Lab Results: ?No results for input(s): WBC, HGB, HCT, PLT in the last 72 hours. ?BMET ?No results for input(s): NA, K, CL, CO2, GLUCOSE, BUN, CREATININE, CALCIUM in the last 72 hours. ? ?Studies/Results: ?DG Cervical Spine 1 View ? ?Result Date: 11/02/2021 ?CLINICAL DATA:  Anterior cervical decompression and fusion C4-C6, cervical C6-C7 RSTO EXAM: DG CERVICAL SPINE - 1 VIEW COMPARISON:  MR cervical spine 08/02/2021 Fluoroscopy time: 0 minutes 10 seconds Dose: 1.29 mGy Images: 2 FINDINGS: Anterior plate and screws at U4-Q0 with intervening disc prostheses. Bones demineralized. No fracture or subluxation. IMPRESSION: Post anterior fusion, C4-C7. Electronically Signed   By: Lavonia Dana M.D.   On: 11/02/2021 14:14  ? ?DG C-Arm 1-60 Min-No Report ? ?Result Date: 11/02/2021 ?Fluoroscopy was utilized by the requesting physician.  No radiographic interpretation.  ? ?DG C-Arm 1-60 Min-No Report ? ?Result Date: 11/02/2021 ?Fluoroscopy was utilized by  the requesting physician.  No radiographic interpretation.  ? ?DG C-Arm 1-60 Min-No Report ? ?Result Date: 11/02/2021 ?Fluoroscopy was utilized by the requesting physician.  No radiographic interpretation.   ? ?Assessment/Plan: ?Patient is post-op day 1 s/p C4/5, C5/6, C6/7 ACDF. His only complaint is severe incisional, shoulder and upper back discomfort. He has ambulated with PT and is awaiting occupational therapy evaluation.  Continue hard cervical collar. Continue working on pain control, mobility and ambulating patient. Patient will likely need CIR placement for later convalescence. Will plan for discharge tomorrow once the patient's pain is better controlled and his discharge plan has been made.  ? ? LOS: 1 day  ? ? ? ?Marvis Moeller, DNP, NP-C ?11/03/2021, 8:41 AM ? ? ? ? ?

## 2021-11-03 NOTE — Progress Notes (Signed)
Inpatient Diabetes Program Recommendations ? ?AACE/ADA: New Consensus Statement on Inpatient Glycemic Control (2015) ? ?Target Ranges:  Prepandial:   less than 140 mg/dL ?     Peak postprandial:   less than 180 mg/dL (1-2 hours) ?     Critically ill patients:  140 - 180 mg/dL  ? ?Lab Results  ?Component Value Date  ? GLUCAP 151 (H) 11/03/2021  ? HGBA1C 6.5 (H) 10/26/2021  ? ? ?Review of Glycemic Control ? Latest Reference Range & Units 11/02/21 14:30 11/02/21 17:16 11/02/21 21:22 11/03/21 06:37  ?Glucose-Capillary 70 - 99 mg/dL 165 (H) 185 (H) 183 (H) 151 (H)  ?(H): Data is abnormally high ?Diabetes history: Type 2 Dm ?Outpatient Diabetes medications: Farxiga 10 mg QD, Novolog 22 units TID, Semglee 26 units QD ?Current orders for Inpatient glycemic control: Farxiga 10 mg QD, Novolog 22 units TID, Semglee 26 units QD ?Decadron 8 mg x 1 ? ?Inpatient Diabetes Program Recommendations:   ? ?Consider also adding Novolog 0-6 units TID.  ?Updated meal coverage parameters for safety.  ? ?Thanks, ?Bronson Curb, MSN, RNC-OB ?Diabetes Coordinator ?[phone] (8a-5p) ? ? ? ? ?

## 2021-11-03 NOTE — Evaluation (Signed)
Occupational Therapy Evaluation ?Patient Details ?Name: George Franco ?MRN: [id number] ?DOB: 1956/03/10 ?Today's Date: 11/03/2021 ? ? ?History of Present Illness Pt is a 66 y.o. M who presents s/p C4-7 ACDF 11/02/2021. Significant PMH: vertigo, stroke, DM2.  ? ?Clinical Impression ?  ?Pt admitted for concerns listed above. PTA pt reported that he was independent with all ADL's and functional mobility, using no AD. At this time, pt presents with decreased balance and activity tolerance, as well as increased pain, requiring increased assist/min guard assist for all BADL's and functional mobility while using RW. Pt requiring increased cuing to follow precautions and use compensatory strategies. Recommending HHOT and acute OT will follow.  ?   ? ?Recommendations for follow up therapy are one component of a multi-disciplinary discharge planning process, led by the attending physician.  Recommendations may be updated based on patient status, additional functional criteria and insurance authorization.  ? ?Follow Up Recommendations ? Home health OT  ?  ?Assistance Recommended at Discharge Set up Supervision/Assistance  ?Patient can return home with the following A little help with walking and/or transfers;A little help with bathing/dressing/bathroom;Assistance with cooking/housework ? ?  ?Functional Status Assessment ? Patient has had a recent decline in their functional status and demonstrates the ability to make significant improvements in function in a reasonable and predictable amount of time.  ?Equipment Recommendations ? None recommended by OT  ?  ?Recommendations for Other Services   ? ? ?  ?Precautions / Restrictions Precautions ?Precautions: Cervical;Fall ?Precaution Booklet Issued: Yes (comment) ?Precaution Comments: verbally reviewed, provided written handout ?Required Braces or Orthoses: Cervical Brace ?Cervical Brace: Hard collar;At all times ?Restrictions ?Weight Bearing Restrictions: No  ? ?  ? ?Mobility Bed  Mobility ?Overal bed mobility: Needs Assistance ?Bed Mobility: Rolling, Sidelying to Sit ?Rolling: Supervision ?Sidelying to sit: Supervision ?  ?  ?  ?General bed mobility comments: supervision for safety ?  ? ?Transfers ?Overall transfer level: Needs assistance ?Equipment used: Rolling walker (2 wheels) ?Transfers: Sit to/from Stand ?Sit to Stand: Min guard ?  ?  ?  ?  ?  ?General transfer comment: Min guard to rise to stand to walker ?  ? ?  ?Balance Overall balance assessment: Needs assistance ?Sitting-balance support: Feet supported ?Sitting balance-Leahy Scale: Good ?  ?  ?Standing balance support: Bilateral upper extremity supported ?Standing balance-Leahy Scale: Poor ?Standing balance comment: reliant on RW ?  ?  ?  ?  ?  ?  ?  ?  ?  ?  ?  ?   ? ?ADL either performed or assessed with clinical judgement  ? ?ADL Overall ADL's : Needs assistance/impaired ?  ?  ?  ?  ?  ?  ?  ?  ?  ?  ?  ?  ?  ?  ?  ?  ?  ?  ?  ?General ADL Comments: Pt overall requiring min guard for safety W/ RW due to poor dynamic balance and increased pain.  ? ? ? ?Vision Baseline Vision/History: 1 Wears glasses ?Ability to See in Adequate Light: 0 Adequate ?Patient Visual Report: No change from baseline ?Vision Assessment?: No apparent visual deficits  ?   ?Perception   ?  ?Praxis   ?  ? ?Pertinent Vitals/Pain Pain Assessment ?Pain Assessment: Faces ?Faces Pain Scale: Hurts little more ?Pain Location: surgical site ?Pain Descriptors / Indicators: Discomfort, Grimacing, Operative site guarding ?Pain Intervention(s): Monitored during session, Repositioned  ? ? ? ?Hand Dominance Right ?  ?Extremity/Trunk Assessment Upper Extremity Assessment ?  Upper Extremity Assessment: Overall WFL for tasks assessed ?  ?Lower Extremity Assessment ?Lower Extremity Assessment: Defer to PT evaluation ?  ?Cervical / Trunk Assessment ?Cervical / Trunk Assessment: Neck Surgery ?  ?Communication Communication ?Communication: No difficulties ?  ?Cognition  Arousal/Alertness: Awake/alert ?Behavior During Therapy: Flat affect ?Overall Cognitive Status: No family/caregiver present to determine baseline cognitive functioning ?  ?  ?  ?  ?  ?  ?  ?  ?  ?  ?  ?  ?  ?  ?  ?  ?General Comments: Poor historian. decreased awareness of deficits ?  ?  ?General Comments  VSS on RA, dressing intact ? ?  ?Exercises   ?  ?Shoulder Instructions    ? ? ?Home Living Family/patient expects to be discharged to:: Private residence ?Living Arrangements: Spouse/significant other ?Available Help at Discharge: Family;Available PRN/intermittently ?Type of Home: House ?Home Access: Stairs to enter ?Entrance Stairs-Number of Steps: 5 ?  ?Home Layout: One level ?  ?  ?Bathroom Shower/Tub: Tub/shower unit ?  ?Bathroom Toilet: Standard ?  ?  ?Home Equipment: Conservation officer, nature (2 wheels);Cane - single point ?  ?  ?  ? ?  ?Prior Functioning/Environment Prior Level of Function : Needs assist ?  ?  ?  ?  ?  ?  ?Mobility Comments: using cane, household ambulator, assist with stairs and car transfer. Hx of 1 fall in past 3 months ?ADLs Comments: assist for bathing, shower transfers ?  ? ?  ?  ?OT Problem List: Decreased strength;Decreased range of motion;Decreased activity tolerance;Impaired balance (sitting and/or standing);Pain ?  ?   ?OT Treatment/Interventions: Self-care/ADL training;Therapeutic exercise;Energy conservation;DME and/or AE instruction;Therapeutic activities;Patient/family education;Balance training  ?  ?OT Goals(Current goals can be found in the care plan section) Acute Rehab OT Goals ?Patient Stated Goal: To go home ?OT Goal Formulation: With patient ?Time For Goal Achievement: 11/17/21 ?Potential to Achieve Goals: Good ?ADL Goals ?Pt Will Perform Grooming: Independently;standing ?Pt Will Perform Lower Body Bathing: Independently;sitting/lateral leans;sit to/from stand ?Pt Will Perform Lower Body Dressing: Independently;sitting/lateral leans;sit to/from stand ?Pt Will Transfer to Toilet:  Independently;ambulating ?Pt Will Perform Toileting - Clothing Manipulation and hygiene: Independently;sitting/lateral leans;sit to/from stand  ?OT Frequency: Min 2X/week ?  ? ?Co-evaluation   ?  ?  ?  ?  ? ?  ?AM-PAC OT "6 Clicks" Daily Activity     ?Outcome Measure Help from another person eating meals?: A Little ?Help from another person taking care of personal grooming?: A Little ?Help from another person toileting, which includes using toliet, bedpan, or urinal?: A Little ?Help from another person bathing (including washing, rinsing, drying)?: A Little ?Help from another person to put on and taking off regular upper body clothing?: A Little ?Help from another person to put on and taking off regular lower body clothing?: A Little ?6 Click Score: 18 ?  ?End of Session Equipment Utilized During Treatment: Rolling walker (2 wheels);Cervical collar ?Nurse Communication: Mobility status ? ?Activity Tolerance: Patient tolerated treatment well ?Patient left: in chair;with call bell/phone within reach ? ?OT Visit Diagnosis: Other abnormalities of gait and mobility (R26.89);Muscle weakness (generalized) (M62.81)  ?              ?Time: 0865-7846 ?OT Time Calculation (min): 14 min ?Charges:  OT General Charges ?$OT Visit: 1 Visit ?OT Evaluation ?$OT Eval Moderate Complexity: 1 Mod ? ?Corley Maffeo H., OTR/L ?Acute Rehabilitation ? ?Jillien Yakel Elane Yolanda Bonine ?11/03/2021, 2:26 PM ?

## 2021-11-04 LAB — GLUCOSE, CAPILLARY
Glucose-Capillary: 111 mg/dL — ABNORMAL HIGH (ref 70–99)
Glucose-Capillary: 85 mg/dL (ref 70–99)

## 2021-11-04 MED ORDER — METHOCARBAMOL 500 MG PO TABS: 500.0000 mg | ORAL_TABLET | Freq: Four times a day (QID) | ORAL | 2 refills | Status: AC

## 2021-11-04 MED ORDER — HYDROCODONE-ACETAMINOPHEN 5-325 MG PO TABS: 1.0000 | ORAL_TABLET | ORAL | 0 refills | Status: DC | PRN

## 2021-11-04 NOTE — Discharge Summary (Signed)
Physician Discharge Summary  ?Patient ID: ?George Franco ?MRN: [id number] ?DOB/AGE: 1956/06/25 66 y.o. ? ?Admit date: 11/02/2021 ?Discharge date: 11/04/2021 ? ?Admission Diagnoses: Cervical spondylosis, stenosis with myelopathy, C4-5, C5-6, C6-7 with cord signal change ? ?Discharge Diagnoses: Cervical spondylosis, stenosis with myelopathy, C4-5, C5-6, C6-7 with cord signal change ?Principal Problem: ?  Cervical myelopathy ([place]) ? ? ?Discharged Condition: good ? ?Hospital Course: The patient was admitted on 11/02/2021 and taken to the operating room where the patient underwent C4/5, C5/6, C6/7 ACDF. The patient tolerated the procedure well and was taken to the recovery room and then to the floor in stable condition. The hospital course was routine. There were no complications. The wound remained clean dry and intact. Pt had appropriate back soreness. No complaints of arm pain or new N/T/W. The patient remained afebrile with stable vital signs, and tolerated a regular diet. The patient continued to increase activities, and pain was well controlled with oral pain medications. ? ? ?Consults: None ? ?Significant Diagnostic Studies: radiology: X-Ray: intraoperative ? ? ?Treatments: surgery:  ?1. Arthrodesis C4-5, C5-6, C6-7, anterior interbody technique  ?2. Placement of intervertebral biomechanical device C4-5, C5-6, C6-7; K2 M C4-5: 6 x 13 x 16 mm titanium [place] interbody, C6-7: 6 x 13 x 16 mm titanium [place] interbody, C6-7: 6 x 13 x 16 mm titanium [place] interbody, ?3. Placement of anterior instrumentation consisting of interbody plate and screws -K2 M [place] plate, 51 mm, 16 x 4.0 mm screws bilaterally at C4, C5, C6, C7 ?4. Discectomy at C4-5, C5-6, C6-7 for decompression of spinal cord and exiting nerve roots  ?5. Use of morselized bone allograft  ?6.  Use of morselized autograft, same incision ?7. Use of intraoperative microscope ? ?Discharge Exam: ?Blood pressure (!) 174/72, pulse (!) 57, temperature 98.5 ?F  (36.9 ?C), temperature source Oral, resp. rate 18, height '5\' 9"'$  (1.753 m), weight 82.6 kg, SpO2 100 %. ? ?Physical Exam: Patient is awake, A/O X 4, and conversant. They are in NAD and VSS.  Speech is fluent and appropriate. MAEW with good strength that is symmetric bilaterally. Sensation to light touch is intact. PERLA, EOMI. CNs grossly intact. Dressing is clean dry intact. Incision is well approximated with no drainage, erythema, or edema. Hard cervical collar in place ? ?Disposition: Discharge disposition: 01-Home or Self Care ? ? ? ? ? ? ?Discharge Instructions   ? ? Face-to-face encounter (required for Medicare/Medicaid patients)   Complete by: As directed ?  ? I Marvis Moeller certify that this patient is under my care and that I, or a nurse practitioner or physician's assistant working with me, had a face-to-face encounter that meets the physician face-to-face encounter requirements with this patient on 11/04/2021. The encounter with the patient was in whole, or in part for the following medical condition(s) which is the primary reason for home health care (List medical condition): Cervical spondylosis, stenosis with myelopathy, C4-5, C5-6, C6-7 with cord signal change  ? The encounter with the patient was in whole, or in part, for the following medical condition, which is the primary reason for home health care: Cervical spondylosis, stenosis with myelopathy, C4-5, C5-6, C6-7 with cord signal change  ? I certify that, based on my findings, the following services are medically necessary home health services: Physical therapy  ? Reason for Medically Necessary Home Health Services: Therapy- Personnel officer, Public librarian  ? My clinical findings support the need for the above services: Unsafe ambulation due to balance issues  ?  Further, I certify that my clinical findings support that this patient is homebound due to:  Ambulates short distances less than 300 feet ?Pain interferes with  ambulation/mobility  ?  ? Home Health   Complete by: As directed ?  ? To provide the following care/treatments:  PT ?OT  ?  ? Incentive spirometry RT   Complete by: As directed ?  ? ?  ? ?Allergies as of 11/04/2021   ?No Known Allergies ?  ? ?  ?Medication List  ?  ? ?TAKE these medications   ? ?aspirin EC 81 MG tablet ?Take 1 tablet (81 mg total) by mouth daily. ?  ?Basaglar KwikPen 100 UNIT/ML ?Inject 26 Units into the skin daily. ?  ?benazepril 40 MG tablet ?Commonly known as: LOTENSIN ?Take 40 mg by mouth daily. ?  ?citalopram 10 MG tablet ?Commonly known as: CELEXA ?Take 10 mg by mouth daily. ?  ?ezetimibe 10 MG tablet ?Commonly known as: ZETIA ?Take 10 mg by mouth daily. ?  ?Farxiga 10 MG Tabs tablet ?Generic drug: dapagliflozin propanediol ?Take 10 mg by mouth daily. ?  ?HYDROcodone-acetaminophen 5-325 MG tablet ?Commonly known as: NORCO/VICODIN ?Take 1-2 tablets by mouth every 4 (four) hours as needed for moderate pain. ?  ?loperamide 2 MG capsule ?Commonly known as: IMODIUM ?Take 2 mg by mouth every 6 (six) hours as needed for diarrhea or loose stools. ?  ?meclizine 25 MG tablet ?Commonly known as: ANTIVERT ?Take 1 tablet (25 mg total) by mouth 3 (three) times daily as needed for dizziness. ?  ?methocarbamol 500 MG tablet ?Commonly known as: Robaxin ?Take 1 tablet (500 mg total) by mouth 4 (four) times daily. ?  ?NovoLOG FlexPen 100 UNIT/ML FlexPen ?Generic drug: insulin aspart ?Inject 22 Units into the skin 3 (three) times daily with meals. ?  ?OneTouch Verio test strip ?Generic drug: glucose blood ?USE TO TEST BLOOD SUGAR 3 TIMES A DAY PRIOR TO MEALS AND RECORD ?  ?rosuvastatin 20 MG tablet ?Commonly known as: CRESTOR ?Take 20 mg by mouth daily. ?  ? ?  ? ? Follow-up Information   ? ? [facility] Follow up.   ?Contact information: ?[address] ?[place] [address] ?[phone] ? ? ?  ?  ? ? Dawley, Troy C, DO Follow up in 2 week(s).   ?Why: As needed, If symptoms  worsen ?Contact information: ?[address] ?[address] ?[place] [address] ?[phone] ? ? ?  ?  ? ?  ?  ? ?  ? ? ?Signed: ?Marvis Moeller, DNP, NP-C ?11/04/2021, 12:14 PM ? ? ?

## 2021-11-04 NOTE — Progress Notes (Signed)
Patient awaiting transport via wheelchair by volunteer for discharge to home; in no acute distress nor complaints of pain nor discomfort; incision on his neck with honeycomb dressing and is clean, dry and intact with Aspen collar on; room was checked for all his belongings; discharge instructions concerning his medications, incision care, follow up appointment and when to call the doctor as needed were all discussed with patient by RN and he expressed understanding on the instructions given. ?

## 2021-11-04 NOTE — Progress Notes (Signed)
Occupational Therapy Treatment ?Patient Details ?Name: George Franco ?MRN: [id number] ?DOB: 1955/11/27 ?Today's Date: 11/04/2021 ? ? ?History of present illness Pt is a 66 y.o. M who presents s/p C4-7 ACDF 11/02/2021. Significant PMH: vertigo, stroke, DM2. ?  ?OT comments ? Pt making incremental progress with OT goals. This session he was able to dress, toilet, and groom standing at the sink with supervision for safety. He follows compensatory strategies well after initial cuing. Pt continues to take short shuffled steps and has decreased awareness of his deficits/safety. OT continuing to recommend [facility] services to maximize his independence and safety at home.   ? ?Recommendations for follow up therapy are one component of a multi-disciplinary discharge planning process, led by the attending physician.  Recommendations may be updated based on patient status, additional functional criteria and insurance authorization. ?   ?Follow Up Recommendations ? Home health OT  ?  ?Assistance Recommended at Discharge Set up Supervision/Assistance  ?Patient can return home with the following ? A little help with walking and/or transfers;A little help with bathing/dressing/bathroom;Assistance with cooking/housework ?  ?Equipment Recommendations ? None recommended by OT  ?  ?Recommendations for Other Services   ? ?  ?Precautions / Restrictions Precautions ?Precautions: Cervical;Fall ?Precaution Booklet Issued: Yes (comment) ?Precaution Comments: Reviewed handout and pt was cued for precautions during functional mobility. ?Required Braces or Orthoses: Cervical Brace ?Cervical Brace: Hard collar;At all times (Reviewed changing out pads of brace and care of pads.) ?Restrictions ?Weight Bearing Restrictions: No  ? ? ?  ? ?Mobility Bed Mobility ?Overal bed mobility: Modified Independent ?  ?  ?  ?  ?  ?  ?General bed mobility comments: Pt sat EOB with no physical assist, following log roll technique. ?  ? ?Transfers ?Overall transfer level:  Needs assistance ?Equipment used: Rolling walker (2 wheels) ?Transfers: Sit to/from Stand ?Sit to Stand: Supervision ?  ?  ?  ?  ?  ?General transfer comment: VC's for hand placement on seated surface for safety. Safe from toilet and bed ?  ?  ?Balance Overall balance assessment: Needs assistance ?Sitting-balance support: Feet supported ?Sitting balance-Leahy Scale: Fair ?  ?  ?Standing balance support: Bilateral upper extremity supported ?Standing balance-Leahy Scale: Poor ?Standing balance comment: reliant on RW ?  ?  ?  ?  ?  ?  ?  ?  ?  ?  ?  ?   ? ?ADL either performed or assessed with clinical judgement  ? ?ADL Overall ADL's : Needs assistance/impaired ?  ?  ?  ?  ?  ?  ?  ?  ?  ?  ?  ?  ?  ?  ?  ?  ?  ?  ?  ?General ADL Comments: Pt at supervision level this session with dressing, toileting, and grooming. He continues to fatigue quickly, however is able to complete with no physical assist ?  ? ?Extremity/Trunk Assessment   ?  ?  ?  ?  ?  ? ?Vision   ?  ?  ?Perception   ?  ?Praxis   ?  ? ?Cognition Arousal/Alertness: Awake/alert ?Behavior During Therapy: Flat affect ?Overall Cognitive Status: No family/caregiver present to determine baseline cognitive functioning ?  ?  ?  ?  ?  ?  ?  ?  ?  ?  ?  ?  ?  ?  ?  ?  ?General Comments: Poor historian. decreased awareness of deficits ?  ?  ?   ?Exercises   ? ?  ?  Shoulder Instructions   ? ? ?  ?General Comments VSS on RA, pt reports some dizziness/fatigue  ? ? ?Pertinent Vitals/ Pain       Pain Assessment ?Pain Assessment: Faces ?Faces Pain Scale: Hurts little more ?Pain Location: surgical site ?Pain Descriptors / Indicators: Discomfort, Grimacing, Operative site guarding ?Pain Intervention(s): Limited activity within patient's tolerance, Repositioned, Monitored during session ? ?Home Living   ?  ?  ?  ?  ?  ?  ?  ?  ?  ?  ?  ?  ?  ?  ?  ?  ?  ?  ? ?  ?Prior Functioning/Environment    ?  ?  ?  ?   ? ?Frequency ? Min 2X/week  ? ? ? ? ?  ?Progress Toward Goals ? ?OT  Goals(current goals can now be found in the care plan section) ? Progress towards OT goals: Progressing toward goals ? ?Acute Rehab OT Goals ?Patient Stated Goal: To go home ?OT Goal Formulation: With patient ?Time For Goal Achievement: 11/17/21 ?Potential to Achieve Goals: Good ?ADL Goals ?Pt Will Perform Grooming: Independently;standing ?Pt Will Perform Lower Body Bathing: Independently;sitting/lateral leans;sit to/from stand ?Pt Will Perform Lower Body Dressing: Independently;sitting/lateral leans;sit to/from stand ?Pt Will Transfer to Toilet: Independently;ambulating ?Pt Will Perform Toileting - Clothing Manipulation and hygiene: Independently;sitting/lateral leans;sit to/from stand  ?Plan Discharge plan remains appropriate;Frequency remains appropriate   ? ?Co-evaluation ? ? ?   ?  ?  ?  ?  ? ?  ?AM-PAC OT "6 Clicks" Daily Activity     ?Outcome Measure ? ? Help from another person eating meals?: A Little ?Help from another person taking care of personal grooming?: A Little ?Help from another person toileting, which includes using toliet, bedpan, or urinal?: A Little ?Help from another person bathing (including washing, rinsing, drying)?: A Little ?Help from another person to put on and taking off regular upper body clothing?: A Little ?Help from another person to put on and taking off regular lower body clothing?: A Little ?6 Click Score: 18 ? ?  ?End of Session Equipment Utilized During Treatment: Rolling walker (2 wheels);Cervical collar ? ?OT Visit Diagnosis: Other abnormalities of gait and mobility (R26.89);Muscle weakness (generalized) (M62.81) ?  ?Activity Tolerance Patient tolerated treatment well ?  ?Patient Left in bed;with call bell/phone within reach ?  ?Nurse Communication Mobility status ?  ? ?   ? ?Time: 4166-0630 ?OT Time Calculation (min): 25 min ? ?Charges: OT General Charges ?$OT Visit: 1 Visit ?OT Treatments ?$Self Care/Home Management : 23-37 mins ? ?Earlisha Sharples H., OTR/L ?Acute  Rehabilitation ? ?Damont Balles Elane Yolanda Bonine ?11/04/2021, 10:39 AM ?

## 2021-11-04 NOTE — Discharge Instructions (Signed)
Wound Care ?Leave incision open to air. ?You may shower. ?Do not scrub directly on incision.  ?Do not put any creams, lotions, or ointments on incision. ? ?Activity ?Walk each and every day, increasing distance each day. ?No lifting greater than 5 lbs.  Avoid excessive neck motion. ?No driving for 2 weeks; may ride as a passenger locally. ?Wear neck brace at all times except when showering.  If provided soft collar, may wear for comfort unless otherwise instructed. ? ?Diet ?Resume your normal diet.  ? ?Call Your Doctor If Any of These Occur ?Redness, drainage, or swelling at the wound.  ?Temperature greater than 101 degrees. ?Severe pain not relieved by pain medication. ?Increased difficulty swallowing. ?Incision starts to come apart. ? ?Follow Up Appt ?Call today for appointment in 3-4 weeks ([phone]) or for problems.  If you have any hardware placed in your spine, you will need an x-ray before your appointment.  ?

## 2021-11-04 NOTE — Progress Notes (Signed)
Physical Therapy Treatment ?Patient Details ?Name: George Franco ?MRN: [id number] ?DOB: [birth year] ?Today's Date: 11/04/2021 ? ? ?History of Present Illness Pt is a 66 y.o. M who presents s/p C4-7 ACDF 11/02/2021. Significant PMH: vertigo, stroke, DM2. ? ?  ?PT Comments  ? ? Pt progressing well with post-op mobility. He was able to demonstrate transfers and ambulation with gross min guard assist and RW for support. Pt was educated on precautions, brace application/wearing schedule, appropriate activity progression, and car transfer. Will continue to follow.   ?   ?Recommendations for follow up therapy are one component of a multi-disciplinary discharge planning process, led by the attending physician.  Recommendations may be updated based on patient status, additional functional criteria and insurance authorization. ? ?Follow Up Recommendations ? Home health PT ?  ?  ?Assistance Recommended at Discharge Frequent or constant Supervision/Assistance  ?Patient can return home with the following A little help with walking and/or transfers;A little help with bathing/dressing/bathroom;Assistance with cooking/housework;Direct supervision/assist for medications management;Assist for transportation;Help with stairs or ramp for entrance ?  ?Equipment Recommendations ? None recommended by PT  ?  ?Recommendations for Other Services   ? ? ?  ?Precautions / Restrictions Precautions ?Precautions: Cervical;Fall ?Precaution Booklet Issued: Yes (comment) ?Precaution Comments: Reviewed handout and pt was cued for precautions during functional mobility. ?Required Braces or Orthoses: Cervical Brace ?Cervical Brace: Hard collar;At all times (Reviewed changing out pads of brace and care of pads.) ?Restrictions ?Weight Bearing Restrictions: No  ?  ? ?Mobility ? Bed Mobility ?  ?  ?  ?  ?  ?  ?  ?General bed mobility comments: Pt was received sitting up EOB. ?  ? ?Transfers ?Overall transfer level: Needs assistance ?Equipment used: Rolling  walker (2 wheels) ?Transfers: Sit to/from Stand ?Sit to Stand: Min guard ?  ?  ?  ?  ?  ?General transfer comment: VC's for hand placement on seated surface for safety. Hands on guarding for balance support. ?  ? ?Ambulation/Gait ?Ambulation/Gait assistance: Min guard ?Gait Distance (Feet): 50 Feet ?Assistive device: Rolling walker (2 wheels) ?Gait Pattern/deviations: Step-to pattern, Decreased stride length, Shuffle ?Gait velocity: decreased ?Gait velocity interpretation: 1.31 - 2.62 ft/sec, indicative of limited community ambulator ?  ?General Gait Details: Shuffling gait pattern with decreased bilateral foot clearance. Step-to pattern with suddenly increasing gait speed. Fatigues quickly. Pt was rolled to the stairs for stair training with the transfer chair. ? ? ?Stairs ?Stairs: Yes ?Stairs assistance: Min guard ?Stair Management: Two rails, Forwards, Alternating pattern ?Number of Stairs: 3 ?General stair comments: Pt with good negotiation of stairs with BUE support on rails. Pt reports at home he will be able to go in the back door which only has 2 steps. VC's for improved posture and minimizing pull on UE's on the rails. ? ? ?Wheelchair Mobility ?  ? ?Modified Rankin (Stroke Patients Only) ?  ? ? ?  ?Balance Overall balance assessment: Needs assistance ?Sitting-balance support: Feet supported ?Sitting balance-Leahy Scale: Fair ?  ?  ?Standing balance support: Bilateral upper extremity supported ?Standing balance-Leahy Scale: Poor ?Standing balance comment: reliant on RW ?  ?  ?  ?  ?  ?  ?  ?  ?  ?  ?  ?  ? ?  ?Cognition Arousal/Alertness: Awake/alert ?Behavior During Therapy: Flat affect ?Overall Cognitive Status: No family/caregiver present to determine baseline cognitive functioning ?  ?  ?  ?  ?  ?  ?  ?  ?  ?  ?  ?  ?  ?  ?  ?  ?  General Comments: Poor historian. decreased awareness of deficits ?  ?  ? ?  ?Exercises   ? ?  ?General Comments   ?  ?  ? ?Pertinent Vitals/Pain Pain Assessment ?Pain  Assessment: Faces ?Faces Pain Scale: Hurts little more ?Pain Location: surgical site ?Pain Descriptors / Indicators: Discomfort, Grimacing, Operative site guarding ?Pain Intervention(s): Limited activity within patient's tolerance, Monitored during session, Repositioned  ? ? ?Home Living   ?  ?  ?  ?  ?  ?  ?  ?  ?  ?   ?  ?Prior Function    ?  ?  ?   ? ?PT Goals (current goals can now be found in the care plan section) Acute Rehab PT Goals ?Patient Stated Goal: return to baseline ?PT Goal Formulation: With patient ?Time For Goal Achievement: 11/17/21 ?Potential to Achieve Goals: Good ?Progress towards PT goals: Progressing toward goals ? ?  ?Frequency ? ? ? Min 5X/week ? ? ? ?  ?PT Plan Current plan remains appropriate  ? ? ?Co-evaluation   ?  ?  ?  ?  ? ?  ?AM-PAC PT "6 Clicks" Mobility   ?Outcome Measure ? Help needed turning from your back to your side while in a flat bed without using bedrails?: A Little ?Help needed moving from lying on your back to sitting on the side of a flat bed without using bedrails?: A Little ?Help needed moving to and from a bed to a chair (including a wheelchair)?: A Little ?Help needed standing up from a chair using your arms (e.g., wheelchair or bedside chair)?: A Little ?Help needed to walk in hospital room?: A Little ?Help needed climbing 3-5 steps with a railing? : A Lot ?6 Click Score: 17 ? ?  ?End of Session Equipment Utilized During Treatment: Gait belt;Cervical collar ?Activity Tolerance: Patient tolerated treatment well ?Patient left: in bed;with call bell/phone within reach ?Nurse Communication: Mobility status ?PT Visit Diagnosis: Unsteadiness on feet (R26.81);Other abnormalities of gait and mobility (R26.89);Difficulty in walking, not elsewhere classified (R26.2);Pain ?Pain - part of body:  (neck) ?  ? ? ?Time: [id number] ?PT Time Calculation (min) (ACUTE ONLY): 26 min ? ?Charges:  $Gait Training: 23-37 mins          ?          ? ?Rolinda Roan, PT, DPT ?Acute  Rehabilitation Services ?Pager: [phone] ?Office: [phone]  ? ? ?Thelma Comp ?11/04/2021, 10:10 AM ? ?

## 2021-12-09 DIAGNOSIS — Z8546 Personal history of malignant neoplasm of prostate: Secondary | ICD-10-CM | POA: Diagnosis not present

## 2021-12-09 DIAGNOSIS — I129 Hypertensive chronic kidney disease with stage 1 through stage 4 chronic kidney disease, or unspecified chronic kidney disease: Secondary | ICD-10-CM | POA: Diagnosis not present

## 2021-12-09 DIAGNOSIS — I700 Atherosclerosis of aorta: Secondary | ICD-10-CM | POA: Diagnosis not present

## 2021-12-09 DIAGNOSIS — G319 Degenerative disease of nervous system, unspecified: Secondary | ICD-10-CM | POA: Diagnosis not present

## 2021-12-09 DIAGNOSIS — Z8673 Personal history of transient ischemic attack (TIA), and cerebral infarction without residual deficits: Secondary | ICD-10-CM | POA: Diagnosis not present

## 2021-12-09 DIAGNOSIS — Z794 Long term (current) use of insulin: Secondary | ICD-10-CM | POA: Diagnosis not present

## 2021-12-09 DIAGNOSIS — E1159 Type 2 diabetes mellitus with other circulatory complications: Secondary | ICD-10-CM | POA: Diagnosis not present

## 2021-12-09 DIAGNOSIS — E785 Hyperlipidemia, unspecified: Secondary | ICD-10-CM | POA: Diagnosis not present

## 2022-01-17 ENCOUNTER — Encounter: Payer: Self-pay | Admitting: Psychology

## 2022-01-17 ENCOUNTER — Encounter: Payer: Medicare HMO | Attending: Psychology | Admitting: Psychology

## 2022-01-17 DIAGNOSIS — I6381 Other cerebral infarction due to occlusion or stenosis of small artery: Secondary | ICD-10-CM | POA: Diagnosis not present

## 2022-01-17 DIAGNOSIS — G959 Disease of spinal cord, unspecified: Secondary | ICD-10-CM | POA: Diagnosis not present

## 2022-01-17 DIAGNOSIS — R413 Other amnesia: Secondary | ICD-10-CM | POA: Diagnosis not present

## 2022-01-17 DIAGNOSIS — I6782 Cerebral ischemia: Secondary | ICD-10-CM | POA: Insufficient documentation
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
## 2022-01-21 ENCOUNTER — Encounter: Payer: Self-pay | Admitting: Podiatry

## 2022-01-21 ENCOUNTER — Ambulatory Visit: Payer: Medicare HMO | Admitting: Podiatry

## 2022-01-21 DIAGNOSIS — E1142 Type 2 diabetes mellitus with diabetic polyneuropathy: Secondary | ICD-10-CM

## 2022-01-21 DIAGNOSIS — B351 Tinea unguium: Secondary | ICD-10-CM | POA: Diagnosis not present

## 2022-01-21 DIAGNOSIS — M79675 Pain in left toe(s): Secondary | ICD-10-CM | POA: Diagnosis not present

## 2022-01-21 DIAGNOSIS — M79674 Pain in right toe(s): Secondary | ICD-10-CM

## 2022-01-26 NOTE — Progress Notes (Signed)
  Subjective:  Patient ID: George Franco, male    DOB: October 16, 1955,  MRN: [id number]  Chief Complaint  Patient presents with   Nail Problem    Trim nails    Callouses    I have a callus on the side of the left big toe   66 y.o. male returns for the above complaint.  Patient presents with thickened elongated dystrophic toenails x10.  Patient is a diabetic with last A1c that is unknown.  He states he is doing well.  He denies any other acute complaints he would like to have the nails trimmed down his not able to do it himself.  Objective:  There were no vitals filed for this visit. Podiatric Exam: Vascular: dorsalis pedis and posterior tibial pulses are palpable bilateral. Capillary return is immediate. Temperature gradient is WNL. Skin turgor WNL  Sensorium: Normal Semmes Weinstein monofilament test. Normal tactile sensation bilaterally. Nail Exam: Pt has thick disfigured discolored nails with subungual debris noted bilateral entire nail hallux through fifth toenails.  Pain on palpation to the nails. Ulcer Exam: There is no evidence of ulcer or pre-ulcerative changes or infection. Orthopedic Exam: Muscle tone and strength are WNL. No limitations in general ROM. No crepitus or effusions noted. HAV  B/L.  Hammer toes 2-5  B/L. Skin: No Porokeratosis. No infection or ulcers    Assessment & Plan:   1. Pain due to onychomycosis of toenails of both feet   2. Diabetic peripheral neuropathy associated with type 2 diabetes mellitus ([place])       Patient was evaluated and treated and all questions answered.  Onychomycosis with pain  -Nails palliatively debrided as below. -Educated on self-care  Procedure: Nail Debridement Rationale: pain  Type of Debridement: manual, sharp debridement. Instrumentation: Nail nipper, rotary burr. Number of Nails: 10  Procedures and Treatment: Consent by patient was obtained for treatment procedures. The patient understood the discussion of treatment and  procedures well. All questions were answered thoroughly reviewed. Debridement of mycotic and hypertrophic toenails, 1 through 5 bilateral and clearing of subungual debris. No ulceration, no infection noted.  Return Visit-Office Procedure: Patient instructed to return to the office for a follow up visit 3 months for continued evaluation and treatment.  Boneta Lucks, DPM    No follow-ups on file.

## 2022-02-02 DIAGNOSIS — M62552 Muscle wasting and atrophy, not elsewhere classified, left thigh: Secondary | ICD-10-CM | POA: Diagnosis not present

## 2022-02-02 DIAGNOSIS — M62562 Muscle wasting and atrophy, not elsewhere classified, left lower leg: Secondary | ICD-10-CM | POA: Diagnosis not present

## 2022-02-02 DIAGNOSIS — R269 Unspecified abnormalities of gait and mobility: Secondary | ICD-10-CM | POA: Diagnosis not present

## 2022-02-02 DIAGNOSIS — R2681 Unsteadiness on feet: Secondary | ICD-10-CM | POA: Diagnosis not present

## 2022-02-10 ENCOUNTER — Encounter: Payer: Medicare HMO | Attending: Psychology

## 2022-02-10 DIAGNOSIS — I69320 Aphasia following cerebral infarction: Secondary | ICD-10-CM | POA: Diagnosis not present

## 2022-02-10 DIAGNOSIS — I6381 Other cerebral infarction due to occlusion or stenosis of small artery: Secondary | ICD-10-CM | POA: Insufficient documentation

## 2022-02-10 DIAGNOSIS — Z0189 Encounter for other specified special examinations: Secondary | ICD-10-CM | POA: Insufficient documentation

## 2022-02-10 NOTE — Progress Notes (Signed)
Behavioral Observations The patient appeared well-groomed and appropriately dressed. His manners were polite and appropriate to the situation.The patient ambulated with a cane and had some difficulty seeing testing items, even with glasses, as well as difficulty hearing. He also had moderate difficulty understanding testing instructions. The patient demonstrated a positive attitude toward testing and his effort was good.     Neuropsychology Note  George Franco completed 150 minutes of neuropsychological testing with technician, Dina Rich, BA, under the supervision of [name], PsyD., Clinical Neuropsychologist. The patient did not appear overtly distressed by the testing session, per behavioral observation or via self-report to the technician. Rest breaks were offered.   Clinical Decision Making: In considering the patient's current level of functioning, level of presumed impairment, nature of symptoms, emotional and behavioral responses during clinical interview, level of literacy, and observed level of motivation/effort, a battery of tests was selected by Dr. Sima Matas during initial consultation on 01/17/2022. This was communicated to the technician. Communication between the neuropsychologist and technician was ongoing throughout the testing session and changes were made as deemed necessary based on patient performance on testing, technician observations and additional pertinent factors such as those listed above.  Tests Administered: Controlled Oral Word Association Test (COWAT; FAS & Animals)  Wechsler Adult Intelligence Scale, 4th Edition (WAIS-IV) Wechsler Memory Scale, 4th Edition (WMS-IV); Adult Battery   Results:  COWAT FAS total = 20 Z = -1.82 Animals total = 8 Z = -2.43      WAIS-IV  Composite Score Summary  Scale Sum of Scaled Scores Composite Score Percentile Rank 95% Conf. Interval Qualitative Description  Verbal Comprehension 14 VCI 70 2 66-77  Borderline  Perceptual Reasoning 14 PRI 69 2 64-77 Extremely Low  Working Memory 8 WMI 66 1 61-75 Extremely Low  Processing Speed 6 PSI 62 1 57-74 Extremely Low  Full Scale 42 FSIQ 61 0.5 58-66 Extremely Low  General Ability 28 GAI 67 1 63-73 Extremely Low      Verbal Comprehension Subtests Summary  Subtest Raw Score Scaled Score Percentile Rank Reference Group Scaled Score SEM  Similarities '15 5 5 5 '$ 1.08  Vocabulary '13 4 2 4 '$ 0.73  Information '6 5 5 6 '$ 0.67  (Comprehension) '9 3 1 3 '$ 1.08       Perceptual Reasoning Subtests Summary  Subtest Raw Score Scaled Score Percentile Rank Reference Group Scaled Score SEM  Block Design '11 4 2 3 '$ 1.04  Matrix Reasoning '5 4 2 2 '$ 0.95  Visual Puzzles '7 6 9 5 '$ 0.99  (Figure Weights) '5 5 5 4 '$ 0.99  (Picture Completion) 2 2 0.4 1 1.12       Working Doctor, general practice Raw Score Scaled Score Percentile Rank Reference Group Scaled Score SEM  Digit Span '13 3 1 2 '$ 0.85  Arithmetic '8 5 5 5 '$ 1.04  (Letter-Number Seq.) 6 1 0.1 1 1.08       Processing Speed Subtests Summary  Subtest Raw Score Scaled Score Percentile Rank Reference Group Scaled Score SEM  Symbol Search '9 3 1 2 '$ 1.31  Coding '23 3 1 2 '$ 0.99  (Cancellation) '14 3 1 2 '$ 1.34       WMS-IV  Index Score Summary  Index Sum of Scaled Scores Index Score Percentile Rank 95% Confidence Interval Qualitative Descriptor  Auditory Memory (AMI) 15 62 1 58-70 Extremely Low  Visual Memory (VMI) 14 59 0.3 55-66 Extremely Low  Visual Working Memory (VWMI) 11 73 4 68-82 Borderline  Immediate Memory (IMI)  13 54 0.1 50-63 Extremely Low  Delayed Memory (DMI) 16 60 0.4 56-69 Extremely Low      Primary Subtest Scaled Score Summary  Subtest Domain Raw Score Scaled Score Percentile Rank  Logical Memory I AM 9 2 0.4  Logical Memory II AM 4 2 0.4  Verbal Paired Associates I AM '17 6 9  '$ Verbal Paired Associates II AM '4 5 5  '$ Designs I VM 41 4 2  Designs II VM '28 4 2  '$ Visual  Reproduction I VM 9 1 0.1  Visual Reproduction II VM '6 5 5  '$ Spatial Addition VWM '6 6 9  '$ Symbol Span VWM '10 5 5      '$ Auditory Memory Process Score Summary  Process Score Raw Score Scaled Score Percentile Rank Cumulative Percentage (Base Rate)  LM II Recognition 22 - - 17-25%  VPA II Recognition 37 - - 26-50%       Visual Memory Process Score Summary  Process Score Raw Score Scaled Score Percentile Rank Cumulative Percentage (Base Rate)  DE I Content '22 4 2 '$ -  DE I Spatial '11 6 9 '$ -  DE II Content '19 4 2 '$ -  DE II Spatial '9 8 25 '$ -  DE II Recognition 11 - - 10-16%  VR II Recognition 1 - - <=2%      ABILITY-MEMORY ANALYSIS  Ability Score:  VCI: 70 Date of Testing:  WAIS-IV; WMS-IV 2022/02/10  Predicted Difference Method   Index Predicted WMS-IV Index Score Actual WMS-IV Index Score Difference Critical Value  Significant Difference Y/N Base Rate  Auditory Memory 84 62 22 9.00 Y 5%  Visual Memory 87 59 28 8.38 Y 2%  Visual Working Memory 84 73 11 10.86 Y 20%  Immediate Memory 83 54 29 10.12 Y 1%  Delayed Memory 85 60 25 9.95 Y 3%  Statistical significance (critical value) at the .01 level.       Feedback to Patient: George Franco will return on 07/25/2022 for an interactive feedback session with Dr. Sima Matas at which time his test performances, clinical impressions and treatment recommendations will be reviewed in detail. The patient understands he can contact our office should he require our assistance before this time.  150 minutes spent face-to-face with patient administering standardized tests, 30 minutes spent scoring Environmental education officer). [CPT [id number], [id number]]  Full report to follow.

## 2022-02-21 DIAGNOSIS — R2681 Unsteadiness on feet: Secondary | ICD-10-CM | POA: Diagnosis not present

## 2022-02-21 DIAGNOSIS — M62562 Muscle wasting and atrophy, not elsewhere classified, left lower leg: Secondary | ICD-10-CM | POA: Diagnosis not present

## 2022-02-21 DIAGNOSIS — M62552 Muscle wasting and atrophy, not elsewhere classified, left thigh: Secondary | ICD-10-CM | POA: Diagnosis not present

## 2022-02-21 DIAGNOSIS — R269 Unspecified abnormalities of gait and mobility: Secondary | ICD-10-CM | POA: Diagnosis not present

## 2022-04-05 ENCOUNTER — Encounter: Payer: Medicare HMO | Attending: Psychology | Admitting: Psychology

## 2022-04-05 ENCOUNTER — Encounter: Payer: Self-pay | Admitting: Psychology

## 2022-04-05 DIAGNOSIS — R413 Other amnesia: Secondary | ICD-10-CM | POA: Insufficient documentation

## 2022-04-05 DIAGNOSIS — I6782 Cerebral ischemia: Secondary | ICD-10-CM | POA: Diagnosis not present

## 2022-04-05 DIAGNOSIS — I6381 Other cerebral infarction due to occlusion or stenosis of small artery: Secondary | ICD-10-CM | POA: Insufficient documentation

## 2022-04-05 DIAGNOSIS — F0280 Dementia in other diseases classified elsewhere without behavioral disturbance: Secondary | ICD-10-CM | POA: Diagnosis not present
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
## 2022-04-15 DIAGNOSIS — E1159 Type 2 diabetes mellitus with other circulatory complications: Secondary | ICD-10-CM | POA: Diagnosis not present

## 2022-04-15 DIAGNOSIS — I129 Hypertensive chronic kidney disease with stage 1 through stage 4 chronic kidney disease, or unspecified chronic kidney disease: Secondary | ICD-10-CM | POA: Diagnosis not present

## 2022-04-15 DIAGNOSIS — I700 Atherosclerosis of aorta: Secondary | ICD-10-CM | POA: Diagnosis not present

## 2022-04-15 DIAGNOSIS — F01C0 Vascular dementia, severe, without behavioral disturbance, psychotic disturbance, mood disturbance, and anxiety: Secondary | ICD-10-CM | POA: Diagnosis not present

## 2022-04-15 DIAGNOSIS — E785 Hyperlipidemia, unspecified: Secondary | ICD-10-CM | POA: Diagnosis not present

## 2022-04-15 DIAGNOSIS — Z8546 Personal history of malignant neoplasm of prostate: Secondary | ICD-10-CM | POA: Diagnosis not present

## 2022-04-15 DIAGNOSIS — G319 Degenerative disease of nervous system, unspecified: Secondary | ICD-10-CM | POA: Diagnosis not present

## 2022-04-15 DIAGNOSIS — M546 Pain in thoracic spine: Secondary | ICD-10-CM | POA: Diagnosis not present

## 2022-04-15 DIAGNOSIS — F331 Major depressive disorder, recurrent, moderate: Secondary | ICD-10-CM | POA: Diagnosis not present

## 2022-04-15 DIAGNOSIS — R634 Abnormal weight loss: Secondary | ICD-10-CM | POA: Diagnosis not present

## 2022-04-20 ENCOUNTER — Other Ambulatory Visit: Payer: Self-pay | Admitting: Endocrinology

## 2022-04-20 ENCOUNTER — Ambulatory Visit (HOSPITAL_COMMUNITY): Payer: Medicare HMO

## 2022-04-20 ENCOUNTER — Other Ambulatory Visit (HOSPITAL_COMMUNITY): Payer: Self-pay | Admitting: Endocrinology

## 2022-04-20 DIAGNOSIS — M546 Pain in thoracic spine: Secondary | ICD-10-CM

## 2022-04-21 ENCOUNTER — Ambulatory Visit (HOSPITAL_COMMUNITY): Payer: Medicare HMO

## 2022-04-21 ENCOUNTER — Encounter (HOSPITAL_COMMUNITY): Payer: Self-pay

## 2022-04-21 ENCOUNTER — Ambulatory Visit (HOSPITAL_COMMUNITY)
Admission: RE | Admit: 2022-04-21 | Discharge: 2022-04-21 | Disposition: A | Discharge status: Home / Self Care | Payer: Medicare HMO | Source: Ambulatory Visit | Attending: Endocrinology | Admitting: Endocrinology

## 2022-04-21 DIAGNOSIS — M546 Pain in thoracic spine: Secondary | ICD-10-CM | POA: Insufficient documentation

## 2022-04-21 MED ORDER — GADOPICLENOL 0.5 MMOL/ML IV SOLN
8.0000 mL | Freq: Once | INTRAVENOUS | Status: AC | PRN
  Administered 2022-04-21: 8 mL via INTRAVENOUS

## 2022-04-25 DIAGNOSIS — M4802 Spinal stenosis, cervical region: Secondary | ICD-10-CM | POA: Diagnosis not present

## 2022-04-26 ENCOUNTER — Other Ambulatory Visit: Payer: Self-pay | Admitting: Endocrinology

## 2022-04-26 ENCOUNTER — Other Ambulatory Visit (HOSPITAL_COMMUNITY): Payer: Self-pay | Admitting: Endocrinology

## 2022-04-26 DIAGNOSIS — M899 Disorder of bone, unspecified: Secondary | ICD-10-CM

## 2022-04-29 ENCOUNTER — Ambulatory Visit: Payer: Medicare HMO | Admitting: Podiatry

## 2022-05-09 ENCOUNTER — Encounter (HOSPITAL_COMMUNITY)
Admission: RE | Admit: 2022-05-09 | Discharge: 2022-05-09 | Disposition: A | Discharge status: Home / Self Care | Payer: Medicare HMO | Source: Ambulatory Visit | Attending: Endocrinology | Admitting: Endocrinology

## 2022-05-09 DIAGNOSIS — M160 Bilateral primary osteoarthritis of hip: Secondary | ICD-10-CM | POA: Diagnosis not present

## 2022-05-09 DIAGNOSIS — M899 Disorder of bone, unspecified: Secondary | ICD-10-CM | POA: Insufficient documentation

## 2022-05-09 DIAGNOSIS — Z8546 Personal history of malignant neoplasm of prostate: Secondary | ICD-10-CM | POA: Diagnosis not present

## 2022-05-09 DIAGNOSIS — M19012 Primary osteoarthritis, left shoulder: Secondary | ICD-10-CM | POA: Diagnosis not present

## 2022-05-09 DIAGNOSIS — S22050A Wedge compression fracture of T5-T6 vertebra, initial encounter for closed fracture: Secondary | ICD-10-CM | POA: Diagnosis not present

## 2022-05-09 MED ORDER — TECHNETIUM TC 99M MEDRONATE IV KIT
20.0000 | PACK | Freq: Once | INTRAVENOUS | Status: AC | PRN
  Administered 2022-05-09: 20 via INTRAVENOUS

## 2022-05-13 ENCOUNTER — Telehealth: Payer: Self-pay | Admitting: Hematology and Oncology

## 2022-05-13 DIAGNOSIS — M899 Disorder of bone, unspecified: Secondary | ICD-10-CM | POA: Diagnosis not present

## 2022-05-13 NOTE — Telephone Encounter (Signed)
Scheduled appt per 10/6 referral. Pt's wife is aware of appt date and time. Pt's wife i is aware to arrive 15 mins prior to appt time and to bring and updated insurance card. Pt's wife i is aware of appt location.

## 2022-05-19 ENCOUNTER — Other Ambulatory Visit: Payer: Self-pay | Admitting: Endocrinology

## 2022-05-19 ENCOUNTER — Encounter: Payer: Medicare HMO | Admitting: Psychology

## 2022-05-19 DIAGNOSIS — M899 Disorder of bone, unspecified: Secondary | ICD-10-CM

## 2022-05-24 ENCOUNTER — Other Ambulatory Visit: Payer: Self-pay | Admitting: Interventional Radiology

## 2022-05-24 ENCOUNTER — Ambulatory Visit
Admission: RE | Admit: 2022-05-24 | Discharge: 2022-05-24 | Disposition: A | Discharge status: Home / Self Care | Payer: Medicare HMO | Source: Ambulatory Visit | Attending: Endocrinology | Admitting: Endocrinology

## 2022-05-24 VITALS — BP 168/83 | HR 75

## 2022-05-24 DIAGNOSIS — M899 Disorder of bone, unspecified: Secondary | ICD-10-CM

## 2022-05-24 DIAGNOSIS — S22050A Wedge compression fracture of T5-T6 vertebra, initial encounter for closed fracture: Secondary | ICD-10-CM | POA: Diagnosis not present

## 2022-05-24 DIAGNOSIS — S22000A Wedge compression fracture of unspecified thoracic vertebra, initial encounter for closed fracture: Secondary | ICD-10-CM | POA: Diagnosis not present

## 2022-05-24 HISTORY — PX: IR RADIOLOGIST EVAL & MGMT: IMG5224

## 2022-05-24 NOTE — H&P (Signed)
Interventional Radiology - Clinic Visit, Initial H&P    Referring Provider: [name], MD  Reason for Visit: T6 fracture    History of Present Illness  George Franco is a 66 y.o. male with a relevant past medical history of prostate cancer (previously treated 7-8 years ago per patient) seen today in Interventional Radiology clinic for new T6 compression fracture.  Patient reports a fall earlier this year in June.  MRI in April 21, 2022 demonstrated acute to subacute T6 compression fracture.  MRI report also raise concern for possible underlying lesion.  Patient does have a history of previously treated prostate cancer.  PSA levels are pending to be drawn.  He says the pain has gradually improved since his initial injury, with his pain currently being 5/10.  He is not currently taking any pain medication.  Moderate to severe disability on the [employer] disability questionnaire with 18/24 positive.    The patient had additional nuclear medicine bone uptake scan on May 09, 2022, demonstrating uptake at T6 and along the right fifth and sixth ribs.  Additional questionable uptake was noted in the left pelvis.    Additional Past Medical History Past Medical History:  Diagnosis Date   At risk for sleep apnea    STOP-BANG= 5     SENT TO PCP 06-02-2015   BPH (benign prostatic hyperplasia)    CTS (carpal tunnel syndrome)    CTS (carpal tunnel syndrome)    ED (erectile dysfunction) of organic origin    Hyperlipidemia    Hypertension    Hypogonadism in male    Knee pain    pt said he doesn't know anything about this   Lower urinary tract symptoms (LUTS)    LVH (left ventricular hypertrophy)    Persistent vertigo of central origin    Prostate cancer [facility]) urologist-  dr ottelin/  oncologist-  [name]   Stage T1c, Gleason 4+3, PSA 4.48,  vol 32.9cc--  External RXT complete 05-08-2015   Stroke ([place])    Type 2 diabetes mellitus ([place])    Vestibular neuropathy    Wears glasses       Surgical History  Past Surgical History:  Procedure Laterality Date   ANTERIOR CERVICAL DECOMP/DISCECTOMY FUSION N/A 11/02/2021   Procedure: Anterior Cervical Decompression Fusion, Cervical four-five, Cervical five-six, Cervical six-seven;  Surgeon: Dawley, Theodoro Doing, DO;  Location: [place];  Service: Neurosurgery;  Laterality: N/A;   COLONOSCOPY     IR ANGIOGRAM EXTREMITY RIGHT  03/05/2020   IR FEM POP ART PTA MOD SED  03/05/2020   IR RADIOLOGIST EVAL & MGMT  02/26/2020   IR RADIOLOGIST EVAL & MGMT  04/15/2020   IR RADIOLOGIST EVAL & MGMT  07/22/2020   IR TIB-PERO ART ATHEREC INC PTA MOD SED  03/05/2020   IR US GUIDE VASC ACCESS RIGHT  03/05/2020   PROSTATE BIOPSY     RADIOACTIVE SEED IMPLANT N/A 06/05/2015   Procedure: RADIOACTIVE SEED IMPLANT/BRACHYTHERAPY IMPLANT;  Surgeon: [name], MD;  Location: [place];  Service: Urology;  Laterality: N/A;   ROTATOR CUFF REPAIR Right 1191   UMBILICAL HERNIA REPAIR  1990's   UPPER GASTROINTESTINAL ENDOSCOPY     wisdom teeth extrat       Medications  I have reviewed the current medication list. Refer to chart for details. Current Outpatient Medications  Medication Instructions   aspirin EC 81 mg, Oral, Daily   Basaglar KwikPen 26 Units, Subcutaneous, Daily   benazepril (LOTENSIN) 40 MG tablet TAKE  1 TABLET BY MOUTH EVERY DAY Orally Once a day for 90 days   benazepril (LOTENSIN) 40 mg, Oral, Daily   citalopram (CELEXA) 10 mg, Oral, Daily   ezetimibe (ZETIA) 10 mg, Oral, Daily   Farxiga 10 mg, Oral, Daily   HYDROcodone-acetaminophen (NORCO/VICODIN) 5-325 MG tablet 1-2 tablets, Oral, Every 4 hours PRN   loperamide (IMODIUM) 2 mg, Oral, Every 6 hours PRN   meclizine (ANTIVERT) 25 mg, Oral, 3 times daily PRN   methocarbamol (ROBAXIN) 500 mg, Oral, 4 times daily   NovoLOG FlexPen 22 Units, Subcutaneous, 3 times daily with meals   ONETOUCH VERIO test strip USE TO TEST BLOOD SUGAR 3 TIMES A DAY PRIOR TO MEALS AND RECORD    rosuvastatin (CRESTOR) 20 mg, Daily      Allergies No Known Allergies Does patient have contrast allergy: No     Physical Exam Current Vitals   ( )  Pulse Rate: 75     BP: (!) 168/83  SpO2: 98 %        There is no height or weight on file to calculate BMI.  General: Alert and answers questions appropriately. No apparent distress. HEENT: Normocephalic, atraumatic. Conjunctivae normal without scleral icterus. Cardiac: Regular rate and rhythm. No dependent edema. Pulmonary: Normal work of breathing. On room air. Back: Tenderness to palpation over mid upper back, starting at expected level of T6    Pertinent Lab Results    Latest Ref Rng & Units 10/26/2021    2:50 PM 03/05/2020   12:28 PM 01/17/2018   10:57 AM  CBC  WBC 4.0 - 10.5 K/uL 6.3  5.5  4.8   Hemoglobin 13.0 - 17.0 g/dL 12.9  12.8  13.9   Hematocrit 39.0 - 52.0 % 40.2  40.9  42.3   Platelets 150 - 400 K/uL 188  255  220       Latest Ref Rng & Units 10/26/2021    2:50 PM 12/30/2020    1:35 PM 03/05/2020   12:28 PM  CMP  Glucose 70 - 99 mg/dL 60  67  102   BUN 8 - 23 mg/dL '5  13  9   '$ Creatinine 0.61 - 1.24 mg/dL 0.84  0.84  0.82   Sodium 135 - 145 mmol/L 142  140  141   Potassium 3.5 - 5.1 mmol/L 3.3  4.4  4.0   Chloride 98 - 111 mmol/L 103  101  108   CO2 22 - 32 mmol/L '29  26  23   '$ Calcium 8.9 - 10.3 mg/dL 9.3  9.7  9.2       Relevant and/or Recent Imaging: MRI T spine 04/21/2022  IMPRESSION: Acute T6 compression fracture with up to 65% height loss anteriorly, with mild bulging of the posterior cortex and associated marrow edema and enhancement. There is diffusely low T1 marrow signal at T6, which raises the possibility of an underlying lesion, though appearance on other sequences is reassuring. Recommend correlation with PSA and a nuclear medicine bone scan to assess for the presence of any other suspicious lesions.  Mild bulging of the posterior cortex at T6, along with ligamentum flavum  ossification and thickening resulting in mild spinal canal narrowing at this level. Slight kinking of the cord/focal anterior displacement, related to mild focal kyphosis, and potentially due to the presence of a dorsal arachnoid web.  Multilevel facet arthropathy and mild degenerative disc disease without large disc bulge, significant spinal canal or neural foraminal stenosis at any other level.  Multiple likely benign vertebral body lesions at T7, T10, T9, and T11, as described above.   Electronically Signed By: Maurine Simmering M.D. On: 04/21/2022 18:42     Assessment & Plan:   Patient has suffered subacute traumatic fracture of the T6 vertebra, concerning for underlying pathologic fracture due to secondary involvement of the bone given his history of prostate cancer and MRI findings.   Discussed role of kyphoplasty alone versus RFA + kyphoplasty (OsteoCool). Given concern for possible recurrence, it is reasonable to pursue biopsy with preemptive RFA of the T6 vertebral body, as RFA cannot be done after KP if done first. The patient and family understand, and are agreeable to pursue OsteoCool of T6.    History and exam have demonstrated the following:  Acute/Subacute fracture by imaging dated 04/21/2022, Pain on exam concordant with level of fracture, and Significant disability on the [employer] with 18/24 positive symptoms, reflecting significant impact/impairment of (ADLs)   ICD-10-CM Codes that Support Medical Necessity [email or web address])  C79.51    Secondary malignant neoplasm of bone M84.58XA    Pathological fracture in neoplastic disease, other specified site, initial encounter for fracture  S22.050A    Wedge compression fracture of T5-T6 vertebra, initial encounter for closed fracture    Plan:  T6 OsteoCool vertebral body augmentation with balloon kyphoplasty  Post-procedure  disposition: outpatient DRI-A  Medication holds: TBD  The patient has suffered a fracture of the T6 vertebral body. It is recommended that patients aged 40 years or older be evaluated for possible testing or treatment of osteoporosis. A copy of this consult report is sent to the patient's referring physician.  Advanced Care Plan: The patient did not want to provide an Protivin at the time of this visit     Total time spent on today's visit was over 60 Minutes, including both face-to-face time and non face-to-face time, personally spent on review of chart (including labs and relevant imaging), discussing further workup and treatment options, referral to specialist if needed, reviewing outside records if pertinent, answering patient questions, and coordinating care regarding T6 fracture as well as management strategy.     Albin Felling, MD  Vascular and Interventional Radiology 05/24/2022 5:12 PM

## 2022-05-26 LAB — COMPLETE METABOLIC PANEL WITH GFR
AG Ratio: 1.4 (calc) (ref 1.0–2.5)
ALT: 16 U/L (ref 9–46)
AST: 21 U/L (ref 10–35)
Albumin: 4.7 g/dL (ref 3.6–5.1)
Alkaline phosphatase (APISO): 93 U/L (ref 35–144)
BUN: 9 mg/dL (ref 7–25)
CO2: 26 mmol/L (ref 20–32)
Calcium: 10 mg/dL (ref 8.6–10.3)
Chloride: 102 mmol/L (ref 98–110)
Creat: 0.74 mg/dL (ref 0.70–1.35)
Globulin: 3.3 g/dL (calc) (ref 1.9–3.7)
Glucose, Bld: 73 mg/dL (ref 65–139)
Potassium: 4.1 mmol/L (ref 3.5–5.3)
Sodium: 140 mmol/L (ref 135–146)
Total Bilirubin: 1 mg/dL (ref 0.2–1.2)
Total Protein: 8 g/dL (ref 6.1–8.1)
eGFR: 101 mL/min/{1.73_m2} (ref >=60)

## 2022-05-26 LAB — CBC
HCT: 41.8 % (ref 38.5–50.0)
Hemoglobin: 14 g/dL (ref 13.2–17.1)
MCH: 31.5 pg (ref 27.0–33.0)
MCHC: 33.5 g/dL (ref 32.0–36.0)
MCV: 94.1 fL (ref 80.0–100.0)
MPV: 10 fL (ref 7.5–12.5)
Platelets: 255 10*3/uL (ref 140–400)
RBC: 4.44 10*6/uL (ref 4.20–5.80)
RDW: 13.8 % (ref 11.0–15.0)
WBC: 8.3 10*3/uL (ref 3.8–10.8)

## 2022-05-27 ENCOUNTER — Other Ambulatory Visit: Payer: Self-pay

## 2022-05-27 ENCOUNTER — Emergency Department (HOSPITAL_COMMUNITY): Payer: Medicare HMO

## 2022-05-27 ENCOUNTER — Encounter (HOSPITAL_COMMUNITY): Payer: Self-pay

## 2022-05-27 ENCOUNTER — Emergency Department (HOSPITAL_COMMUNITY)
Admission: EM | Admit: 2022-05-27 | Discharge: 2022-05-28 | Disposition: A | Discharge status: Home / Self Care | Payer: Medicare HMO | Attending: Emergency Medicine | Admitting: Emergency Medicine

## 2022-05-27 DIAGNOSIS — M542 Cervicalgia: Secondary | ICD-10-CM | POA: Insufficient documentation

## 2022-05-27 DIAGNOSIS — W19XXXA Unspecified fall, initial encounter: Secondary | ICD-10-CM | POA: Insufficient documentation

## 2022-05-27 DIAGNOSIS — Z794 Long term (current) use of insulin: Secondary | ICD-10-CM | POA: Insufficient documentation

## 2022-05-27 DIAGNOSIS — E162 Hypoglycemia, unspecified: Secondary | ICD-10-CM

## 2022-05-27 DIAGNOSIS — R4182 Altered mental status, unspecified: Secondary | ICD-10-CM | POA: Diagnosis not present

## 2022-05-27 DIAGNOSIS — R000 Tachycardia, unspecified: Secondary | ICD-10-CM | POA: Diagnosis not present

## 2022-05-27 DIAGNOSIS — Z79899 Other long term (current) drug therapy: Secondary | ICD-10-CM | POA: Insufficient documentation

## 2022-05-27 DIAGNOSIS — E161 Other hypoglycemia: Secondary | ICD-10-CM | POA: Diagnosis not present

## 2022-05-27 DIAGNOSIS — J439 Emphysema, unspecified: Secondary | ICD-10-CM | POA: Diagnosis not present

## 2022-05-27 DIAGNOSIS — Z7984 Long term (current) use of oral hypoglycemic drugs: Secondary | ICD-10-CM | POA: Diagnosis not present

## 2022-05-27 DIAGNOSIS — Z7982 Long term (current) use of aspirin: Secondary | ICD-10-CM | POA: Diagnosis not present

## 2022-05-27 DIAGNOSIS — R569 Unspecified convulsions: Secondary | ICD-10-CM | POA: Diagnosis not present

## 2022-05-27 DIAGNOSIS — S99922A Unspecified injury of left foot, initial encounter: Secondary | ICD-10-CM | POA: Diagnosis not present

## 2022-05-27 DIAGNOSIS — R410 Disorientation, unspecified: Secondary | ICD-10-CM | POA: Diagnosis not present

## 2022-05-27 DIAGNOSIS — E11649 Type 2 diabetes mellitus with hypoglycemia without coma: Secondary | ICD-10-CM | POA: Diagnosis not present

## 2022-05-27 DIAGNOSIS — M4602 Spinal enthesopathy, cervical region: Secondary | ICD-10-CM | POA: Diagnosis not present

## 2022-05-27 DIAGNOSIS — I10 Essential (primary) hypertension: Secondary | ICD-10-CM | POA: Insufficient documentation

## 2022-05-27 DIAGNOSIS — S199XXA Unspecified injury of neck, initial encounter: Secondary | ICD-10-CM | POA: Diagnosis not present

## 2022-05-27 DIAGNOSIS — M7989 Other specified soft tissue disorders: Secondary | ICD-10-CM | POA: Diagnosis not present

## 2022-05-27 DIAGNOSIS — I6529 Occlusion and stenosis of unspecified carotid artery: Secondary | ICD-10-CM | POA: Diagnosis not present

## 2022-05-27 LAB — CBC
HCT: 37.4 % — ABNORMAL LOW (ref 39.0–52.0)
Hemoglobin: 12.1 g/dL — ABNORMAL LOW (ref 13.0–17.0)
MCH: 31.2 pg (ref 26.0–34.0)
MCHC: 32.4 g/dL (ref 30.0–36.0)
MCV: 96.4 fL (ref 80.0–100.0)
Platelets: 174 10*3/uL (ref 150–400)
RBC: 3.88 MIL/uL — ABNORMAL LOW (ref 4.22–5.81)
RDW: 14.7 % (ref 11.5–15.5)
WBC: 7.1 10*3/uL (ref 4.0–10.5)
nRBC: 0 % (ref 0.0–0.2)

## 2022-05-27 LAB — BASIC METABOLIC PANEL
Anion gap: 8 (ref 5–15)
BUN: 13 mg/dL (ref 8–23)
CO2: 28 mmol/L (ref 22–32)
Calcium: 8.9 mg/dL (ref 8.9–10.3)
Chloride: 103 mmol/L (ref 98–111)
Creatinine, Ser: 0.72 mg/dL (ref 0.61–1.24)
GFR, Estimated: >60 mL/min (ref >60)
Glucose, Bld: 64 mg/dL — ABNORMAL LOW (ref 70–99)
Potassium: 3.9 mmol/L (ref 3.5–5.1)
Sodium: 139 mmol/L (ref 135–145)

## 2022-05-27 LAB — I-STAT CHEM 8, ED
BUN: 13 mg/dL (ref 8–23)
Calcium, Ion: 1.16 mmol/L (ref 1.15–1.40)
Chloride: 104 mmol/L (ref 98–111)
Creatinine, Ser: 0.7 mg/dL (ref 0.61–1.24)
Glucose, Bld: 57 mg/dL — ABNORMAL LOW (ref 70–99)
HCT: 40 % (ref 39.0–52.0)
Hemoglobin: 13.6 g/dL (ref 13.0–17.0)
Potassium: 4.1 mmol/L (ref 3.5–5.1)
Sodium: 141 mmol/L (ref 135–145)
TCO2: 29 mmol/L (ref 22–32)

## 2022-05-27 LAB — CBG MONITORING, ED
Glucose-Capillary: 80 mg/dL (ref 70–99)
Glucose-Capillary: 158 mg/dL — ABNORMAL HIGH (ref 70–99)
Glucose-Capillary: 135 mg/dL — ABNORMAL HIGH (ref 70–99)

## 2022-05-27 MED ORDER — SODIUM CHLORIDE 0.9 % IV BOLUS
1000.0000 mL | Freq: Once | INTRAVENOUS | Status: AC
  Administered 2022-05-27: 1000 mL via INTRAVENOUS

## 2022-05-27 MED ORDER — SODIUM CHLORIDE 0.9% FLUSH
3.0000 mL | Freq: Two times a day (BID) | INTRAVENOUS | Status: DC
  Administered 2022-05-27: 3 mL via INTRAVENOUS

## 2022-05-27 MED ORDER — SODIUM CHLORIDE 0.9% FLUSH
3.0000 mL | INTRAVENOUS | Status: DC | PRN
Start: 2022-05-27 — End: 2022-05-27

## 2022-05-27 MED ORDER — SODIUM CHLORIDE 0.9 % IV SOLN: 250.0000 mL | INTRAVENOUS | Status: DC | PRN

## 2022-05-27 MED ORDER — DEXTROSE 50 % IV SOLN
1.0000 | Freq: Once | INTRAVENOUS | Status: AC
  Administered 2022-05-27: 50 mL via INTRAVENOUS
  Filled 2022-05-27: qty 50

## 2022-05-27 NOTE — ED Notes (Signed)
Pt. Given a turkey sandwich and ginger ale 

## 2022-05-27 NOTE — ED Provider Notes (Signed)
Assumed care of patient at shift change pending remaining blood work and disposition.  Please see provider note for full H&P.  I evaluated the patient at the bedside, his wife as well as his son are present.  Wife provides primary history: She states that the patient was in bed with her when he started to have what seemed to be involuntary jerking type motions, he was not answering her when she was asking him what was wrong, this lasted for somewhere around 30 seconds, he subsequently tried to get out of bed and fell to the ground hitting his head on the trash can. She states his eyes rolled back.  She called 911.  Patient's wife further elaborates that he has had a tough time from a overall health standpoint over the past several months with decreased p.o. intake, difficulty swallowing, left upper extremity and trouble with sleep.  He has also had issues with his memory that seem to wax and wane, he is seeing specialist for this, he does have a history of a prior stroke several months ago.  Patient currently states he feels okay, he has no complaints of pain, he states he is otherwise been in his usual state of health recently.  But the patient and his wife have not noted any fevers, chills, cough, dyspnea, vomiting, diarrhea, or abdominal pain.  Per prior provider initial CBG with EMS was in the 50s, they gave D50 in route with improvement into the 200s, blood sugar subsequently fell into the 50s again therefore an additional amp of D50 was ordered.   I further discussed the patient's p.o. intake and insulin regimen with both him and his family.  Per his wife he has had eggs, grits, cheese, as well as some ice cream and a doughnut.  He last ate around 1500 today.  He utilizes 22 units of NovoLog twice per day with meals and 26 units of Basaglar daily in the morning.  He is not entirely sure when he last had his insulin, he is not entirely confident that he had any of it today.  However both the patient  and family seem sure that he did not take any excess/extra insulin.  On exam patient is alert and oriented.  No facial droop.  Sensation grossly intact bilateral upper and lower extremities.  5/5 symmetric grip strength and strength with plantar dorsiflexion bilaterally.  No chest tenderness.  No abdominal tenderness.  No point/focal vertebral tenderness.  Moving upper extremities without focal bony tenderness.  Lower extremities with some dried blood to the left great toe.  No obvious open wounds.  No subungual hematoma.  Patient able to lift both legs off the bed without difficulty and fully bend the knees and wiggle all digits.  He is tender to the first digit of the left foot.  2+ DP pulses.  Work up reviewed thus far:  CBC with anemia that is worsening for most recently checked however similar to prior ranges.   BMP is notable for hypoglycemia.   CT head 1. No acute intracranial abnormality. No skull fracture. 2. Stable mild atrophy and chronic small vessel ischemia.  CT cspine: 1. No acute fracture or subluxation of the cervical spine. 2. Anterior fusion hardware C4 through C7 without evidence of complication.  Repeat CBG is in the 130s.  We will continue to monitor.  I have added on a left foot x-ray for further assessment  Left foot x-ray:1. Acute mid shaft great toe proximal phalanx fracture with impaction, dorsal  angulation and mild ventral translation of the distal fragment. 2. Acute third metatarsal neck fracture with impaction and mild lateral angulation and small comminution fragments. 3. Osteopenia, degenerative change, soft tissue swelling and vascular calcifications. 4. Dorsal and plantar calcaneal enthesopathy.---> Neurovascular intact distally.  Will place in cam walker boot, weight-bear as tolerated, orthopedics follow-up.  Urinalysis: Glucosuria, no ketonuria, trace hemoglobin, does not appear consistent with UTI.  Repeat CBG 170s.  Patient is tolerating p.o.  He is ambulatory in  the ED in his cam walker without difficulty.  He feels well and ready to go home.  He seems reasonable for discharge at this time. Advised close cbg checks at home and the importance of meals. I discussed results, treatment plan, need for follow-up, and return precautions with the patient and his wife @ bedside. Provided opportunity for questions, patient and his wife confirmed understanding and are in agreement with plan.    This is a shared visit with supervising physician Dr. Mayra Neer who has independently evaluated patient & provided guidance in evaluation/management/disposition, in agreement with care    Medical Decision Making Amount and/or Complexity of Data Reviewed Labs: ordered. Decision-making details documented in ED Course. Radiology: ordered.  Risk Prescription drug management.   Results for orders placed or performed during the hospital encounter of 80/99/83  Basic metabolic panel  Result Value Ref Range   Sodium 139 135 - 145 mmol/L   Potassium 3.9 3.5 - 5.1 mmol/L   Chloride 103 98 - 111 mmol/L   CO2 28 22 - 32 mmol/L   Glucose, Bld 64 (L) 70 - 99 mg/dL   BUN 13 8 - 23 mg/dL   Creatinine, Ser 0.72 0.61 - 1.24 mg/dL   Calcium 8.9 8.9 - 10.3 mg/dL   GFR, Estimated >60 >60 mL/min   Anion gap 8 5 - 15  CBC  Result Value Ref Range   WBC 7.1 4.0 - 10.5 K/uL   RBC 3.88 (L) 4.22 - 5.81 MIL/uL   Hemoglobin 12.1 (L) 13.0 - 17.0 g/dL   HCT 37.4 (L) 39.0 - 52.0 %   MCV 96.4 80.0 - 100.0 fL   MCH 31.2 26.0 - 34.0 pg   MCHC 32.4 30.0 - 36.0 g/dL   RDW 14.7 11.5 - 15.5 %   Platelets 174 150 - 400 K/uL   nRBC 0.0 0.0 - 0.2 %  Urinalysis, Routine w reflex microscopic  Result Value Ref Range   Color, Urine YELLOW YELLOW   APPearance CLEAR CLEAR   Specific Gravity, Urine 1.020 1.005 - 1.030   pH 7.5 5.0 - 8.0   Glucose, UA >=500 (A) NEGATIVE mg/dL   Hgb urine dipstick TRACE (A) NEGATIVE   Bilirubin Urine NEGATIVE NEGATIVE   Ketones, ur NEGATIVE NEGATIVE mg/dL   Protein,  ur NEGATIVE NEGATIVE mg/dL   Nitrite NEGATIVE NEGATIVE   Leukocytes,Ua NEGATIVE NEGATIVE  Urinalysis, Microscopic (reflex)  Result Value Ref Range   RBC / HPF NONE SEEN 0 - 5 RBC/hpf   WBC, UA NONE SEEN 0 - 5 WBC/hpf   Bacteria, UA NONE SEEN NONE SEEN   Squamous Epithelial / LPF NONE SEEN 0 - 5  CBG monitoring, ED  Result Value Ref Range   Glucose-Capillary 80 70 - 99 mg/dL  I-Stat Chem 8, ED ([facility], [facility], [facility] only)  Result Value Ref Range   Sodium 141 135 - 145 mmol/L   Potassium 4.1 3.5 - 5.1 mmol/L   Chloride 104 98 - 111 mmol/L   BUN 13 8 -  23 mg/dL   Creatinine, Ser 0.70 0.61 - 1.24 mg/dL   Glucose, Bld 57 (L) 70 - 99 mg/dL   Calcium, Ion 1.16 1.15 - 1.40 mmol/L   TCO2 29 22 - 32 mmol/L   Hemoglobin 13.6 13.0 - 17.0 g/dL   HCT 40.0 39.0 - 52.0 %  CBG monitoring, ED  Result Value Ref Range   Glucose-Capillary 158 (H) 70 - 99 mg/dL  POC CBG, ED  Result Value Ref Range   Glucose-Capillary 135 (H) 70 - 99 mg/dL  POC CBG, ED  Result Value Ref Range   Glucose-Capillary 171 (H) 70 - 99 mg/dL   DG Foot Complete Left  Result Date: 05/27/2022 CLINICAL DATA:  Fall with left foot injury. EXAM: LEFT FOOT - COMPLETE 3+ VIEW COMPARISON:  No comparison studies. FINDINGS: There is swelling in the forefoot and lateral hindfoot. There is an acute transverse mid shaft fracture of the great toe proximal phalanx, on the lateral view showing mild impaction and mild dorsal angulation of the distal fragment as well as slight ventral translation. There is an acute mildly impacted transverse fracture of the third metatarsal neck, with slight lateral angulation of the distal fragment and small comminution fragments at the fracture margins. There is a healed fracture deformity of the second metatarsal neck. There is osteopenia without evidence of further fractures. Plantar and posterior calcaneal enthesophytes are noted as well as in mild midfoot arthrosis, mild narrowing of the first MTP joint. There are  vascular calcifications in the anterior and posterior distal foreleg with extension into the foot and base of the toes. IMPRESSION: 1. Acute mid shaft great toe proximal phalanx fracture with impaction, dorsal angulation and mild ventral translation of the distal fragment. 2. Acute third metatarsal neck fracture with impaction and mild lateral angulation and small comminution fragments. 3. Osteopenia, degenerative change, soft tissue swelling and vascular calcifications. 4. Dorsal and plantar calcaneal enthesopathy. Electronically Signed   By: Telford Nab M.D.   On: 05/27/2022 23:43   CT Cervical Spine Wo Contrast  Result Date: 05/27/2022 CLINICAL DATA:  Neck trauma, midline tenderness (Age 31-64y) Fall today. EXAM: CT CERVICAL SPINE WITHOUT CONTRAST TECHNIQUE: Multidetector CT imaging of the cervical spine was performed without intravenous contrast. Multiplanar CT image reconstructions were also generated. RADIATION DOSE REDUCTION: This exam was performed according to the departmental dose-optimization program which includes automated exposure control, adjustment of the mA and/or kV according to patient size and/or use of iterative reconstruction technique. COMPARISON:  None Available. FINDINGS: Alignment: No traumatic subluxation. Slight postsurgical straightening. Broad-based dextroscoliotic curvature of the fuse levels. Skull base and vertebrae: No acute fracture. Vertebral body heights are maintained. The dens and skull base are intact. Soft tissues and spinal canal: Anterior fusion hardware C4 through C7. Intact hardware without periprosthetic lucency. No acute fracture. The dens is intact. Intact skull base. Disc levels: Anterior fusion C4 C5 through C6-C7. Disc space narrowing and spurring C2-C3 and C3-C4. Upper chest: Emphysema. No acute apical findings. Other: Carotid calcifications. IMPRESSION: 1. No acute fracture or subluxation of the cervical spine. 2. Anterior fusion hardware C4 through C7  without evidence of complication. Electronically Signed   By: Keith Rake M.D.   On: 05/27/2022 21:01   CT Head Wo Contrast  Result Date: 05/27/2022 CLINICAL DATA:  Head trauma, abnormal mental status (Age 62-64y) Fall today, hypoglycemia. EXAM: CT HEAD WITHOUT CONTRAST TECHNIQUE: Contiguous axial images were obtained from the base of the skull through the vertex without intravenous contrast. RADIATION DOSE REDUCTION:  This exam was performed according to the departmental dose-optimization program which includes automated exposure control, adjustment of the mA and/or kV according to patient size and/or use of iterative reconstruction technique. COMPARISON:  Head CT 06/25/2019 FINDINGS: Brain: No intracranial hemorrhage, mass effect, or midline shift. Stable brain volume. No hydrocephalus. The basilar cisterns are patent. Mild periventricular and deep white matter hypodensity typical of chronic small vessel ischemia. No evidence of territorial infarct or acute ischemia. No extra-axial or intracranial fluid collection. Vascular: Atherosclerosis of skullbase vasculature without hyperdense vessel or abnormal calcification. Skull: No fracture or focal lesion. Sinuses/Orbits: Paranasal sinuses and mastoid air cells are clear. The visualized orbits are unremarkable. Other: No confluent scalp hematoma. IMPRESSION: 1. No acute intracranial abnormality. No skull fracture. 2. Stable mild atrophy and chronic small vessel ischemia. Electronically Signed   By: Keith Rake M.D.   On: 05/27/2022 20:48   IR Radiologist Eval & Mgmt  Result Date: 05/24/2022 EXAM: NEW PATIENT OFFICE VISIT CHIEF COMPLAINT: Refer to EMR HISTORY OF PRESENT ILLNESS: Patient reports a fall earlier this year in June. MRI in April 21, 2022 demonstrated acute to subacute T6 compression fracture. MRI report also raise concern for possible underlying lesion. Patient does have a history of previously treated prostate cancer. PSA levels are  pending to be drawn. He says the pain has gradually improved since his initial injury, with his pain currently being 5/10. He is not currently taking any pain medication. Moderate to severe disability on the [employer] disability questionnaire with 18/24 positive. The patient had additional nuclear medicine bone uptake scan on May 09, 2022, demonstrating uptake at T6 and along the right fifth and sixth ribs. Additional questionable uptake was noted in the left pelvis. REVIEW OF SYSTEMS: Refer to EMR PHYSICAL EXAMINATION: Refer to EMR ASSESSMENT AND PLAN: Refer to EMR Electronically Signed   By: Albin Felling M.D.   On: 05/24/2022 17:28   NM Bone Scan Whole Body  Result Date: 05/09/2022 CLINICAL DATA:  Thoracic bone lesion, history prostate cancer EXAM: NUCLEAR MEDICINE WHOLE BODY BONE SCAN TECHNIQUE: Whole body anterior and posterior images were obtained approximately 3 hours after intravenous injection of radiopharmaceutical. RADIOPHARMACEUTICALS:  19.0 mCi Technetium-16mMDP IV COMPARISON:  None Available. Imaging correlation: MRI thoracic spine 04/21/2022 FINDINGS: Multiple sites of abnormal osseous tracer accumulation are identified. These include T6 and T11 vertebral bodies of thoracic spine, anterior RIGHT fifth and sixth ribs, lateral LEFT fourth rib, question posterolateral RIGHT eighth rib, and questionably inferior LEFT pelvis. Additional sites of abnormal tracer uptake are seen at the shoulders, sternoclavicular joints, hips, LEFT knee and LEFT foot, typically degenerative. Expected urinary tract and soft tissue distribution of tracer. IMPRESSION: Uptake at T6 vertebral body corresponding to known compression fracture on MR. Abnormal tracer uptake at T11 vertebral body as well as foci anterior RIGHT fifth and sixth ribs and questionably in additional BILATERAL ribs and inferior LEFT pelvis as above. Consider rib radiographs for correlation. Electronically Signed   By: MLavonia DanaM.D.   On:  05/09/2022 17:54          Milliani Herrada, SGlynda Jaeger PA-C 05/28/22 [id number]    NAudley Hose MD 06/06/22 [phone]

## 2022-05-27 NOTE — ED Provider Notes (Signed)
[place] DEPT Provider Note   CSN: [id number] Arrival date & time: 05/27/22  1909     History  Chief Complaint  Patient presents with   Hypoglycemia    George Franco is a 66 y.o. male with Hx of HTN, DMT2, BPH, hyperlipidemia, ED, prior lacunar CVA, prostate malignancy, asymmetrical sensorineural hearing loss with disequilibrium.  Presenting today due to concerns of hypoglycemia and neck pain due to fall.  Per nursing staff/EMS, patient's wife heard him fall from the other room.  She came in and found him on the floor.  Called for EMS.  Checked blood glucose, which was at 50, D50 was given.  Blood glucose rechecked at 220.  Patient initially complaining of neck pain after the fall.  Patient reports he does not remember falling, nor much of today's events in general.  Alert and oriented to self, place, and time.  Blood glucose rechecked and was now 80 upon arrival.  Patient denies N/V/D, abdominal pain, recent URI symptoms, urinary symptoms, changes in bowel habits, chest pain, shortness of breath, lightheadedness.  Does endorse decreased appetite and food/fluid intake over the last few days.  Denies recent alcohol/drug use.     The history is provided by the patient and medical records. The history is limited by the condition of the patient and the absence of a caregiver.  Hypoglycemia      Home Medications Prior to Admission medications   Medication Sig Start Date End Date Taking? Authorizing Provider  aspirin EC 81 MG tablet Take 1 tablet (81 mg total) by mouth daily. 06/05/19   Melvenia Beam, MD  benazepril (LOTENSIN) 40 MG tablet Take 40 mg by mouth daily.    [provider]  benazepril (LOTENSIN) 40 MG tablet TAKE 1 TABLET BY MOUTH EVERY DAY Orally Once a day for 90 days    [provider]  citalopram (CELEXA) 10 MG tablet Take 10 mg by mouth daily. 07/02/21   [provider]  ezetimibe (ZETIA) 10 MG tablet Take 10 mg  by mouth daily. 07/13/19   [provider]  FARXIGA 10 MG TABS tablet Take 10 mg by mouth daily. 12/29/17   [provider]  HYDROcodone-acetaminophen (NORCO/VICODIN) 5-325 MG tablet Take 1-2 tablets by mouth every 4 (four) hours as needed for moderate pain. Patient not taking: Reported on 05/24/2022 11/04/21 11/04/22  Marvis Moeller, NP  Insulin Glargine [facility]) 100 UNIT/ML Inject 26 Units into the skin daily.    [provider]  loperamide (IMODIUM) 2 MG capsule Take 2 mg by mouth every 6 (six) hours as needed for diarrhea or loose stools. 01/08/18   [provider]  meclizine (ANTIVERT) 25 MG tablet Take 1 tablet (25 mg total) by mouth 3 (three) times daily as needed for dizziness. 12/30/20   Melvenia Beam, MD  methocarbamol (ROBAXIN) 500 MG tablet Take 1 tablet (500 mg total) by mouth 4 (four) times daily. Patient not taking: Reported on 05/24/2022 11/04/21   Marvis Moeller, NP  NOVOLOG FLEXPEN 100 UNIT/ML FlexPen Inject 22 Units into the skin 3 (three) times daily with meals. 12/12/17   [provider]  ONETOUCH VERIO test strip USE TO TEST BLOOD SUGAR 3 TIMES A DAY PRIOR TO MEALS AND RECORD 03/22/19   [provider]  rosuvastatin (CRESTOR) 20 MG tablet Take 20 mg by mouth daily. Patient not taking: Reported on 05/24/2022 07/19/19   [provider]      Allergies  Patient has no known allergies.    Review of Systems   Review of Systems  Constitutional:        Fall, hypoglycemia  Musculoskeletal:        Neck pain    Physical Exam Updated Vital Signs BP (!) 173/92 (BP Location: Right Arm)   Pulse (!) 107   Temp 98 F (36.7 C) (Oral)   Resp 18   SpO2 95%  Physical Exam Vitals and nursing note reviewed.  Constitutional:      General: He is awake. He is not in acute distress.    Appearance: He is well-developed. He is not ill-appearing or toxic-appearing.     Comments: ABCs appear grossly intact   HENT:     Head: Normocephalic and atraumatic. No raccoon eyes, Battle's sign, contusion or laceration.     Comments: No obvious tenderness or wound of head/face on exam.  Gaze aligned appropriately.  Normal EOMs.  Follows commands. Eyes:     Conjunctiva/sclera: Conjunctivae normal.  Cardiovascular:     Rate and Rhythm: Normal rate and regular rhythm.     Heart sounds: No murmur heard. Pulmonary:     Effort: Pulmonary effort is normal. No respiratory distress.     Breath sounds: Normal breath sounds.     Comments: CTAB, able to communicate without difficulty, without increased respiratory effort Chest:     Chest wall: No mass, deformity, tenderness or crepitus.  Abdominal:     Palpations: Abdomen is soft.     Tenderness: There is no abdominal tenderness.  Musculoskeletal:        General: No swelling.     Comments: C-collar present, cervical ROM deferred due to possibility of fracture.  Without midline thoracic or lumbar tenderness.  Upper and lower extremities appear grossly neurovascularly intact without evidence of obvious acute injury.  Skin:    General: Skin is warm and dry.     Capillary Refill: Capillary refill takes less than 2 seconds.  Neurological:     Mental Status: He is alert and oriented to person, place, and time.  Psychiatric:        Mood and Affect: Mood normal.        Speech: Speech is delayed.        Behavior: Behavior is cooperative.     Comments: Alert and oriented x3, though responses delayed and appears to take some significant thought.  Does not appear to respond to internal stimuli.  Cooperative.  Reports lapse in memory over the last 24 to 48 hours.     ED Results / Procedures / Treatments   Labs (all labs ordered are listed, but only abnormal results are displayed) Labs Reviewed  BASIC METABOLIC PANEL - Abnormal; Notable for the following components:      Result Value   Glucose, Bld 64 (*)    All other components within normal limits  CBC -  Abnormal; Notable for the following components:   RBC 3.88 (*)    Hemoglobin 12.1 (*)    HCT 37.4 (*)    All other components within normal limits  I-STAT CHEM 8, ED - Abnormal; Notable for the following components:   Glucose, Bld 57 (*)    All other components within normal limits  URINALYSIS, ROUTINE W REFLEX MICROSCOPIC  CBG MONITORING, ED    EKG None  Radiology CT Cervical Spine Wo Contrast  Result Date: 05/27/2022 CLINICAL DATA:  Neck trauma, midline tenderness (Age 45-64y) Fall today. EXAM: CT CERVICAL SPINE WITHOUT CONTRAST TECHNIQUE:  Multidetector CT imaging of the cervical spine was performed without intravenous contrast. Multiplanar CT image reconstructions were also generated. RADIATION DOSE REDUCTION: This exam was performed according to the departmental dose-optimization program which includes automated exposure control, adjustment of the mA and/or kV according to patient size and/or use of iterative reconstruction technique. COMPARISON:  None Available. FINDINGS: Alignment: No traumatic subluxation. Slight postsurgical straightening. Broad-based dextroscoliotic curvature of the fuse levels. Skull base and vertebrae: No acute fracture. Vertebral body heights are maintained. The dens and skull base are intact. Soft tissues and spinal canal: Anterior fusion hardware C4 through C7. Intact hardware without periprosthetic lucency. No acute fracture. The dens is intact. Intact skull base. Disc levels: Anterior fusion C4 C5 through C6-C7. Disc space narrowing and spurring C2-C3 and C3-C4. Upper chest: Emphysema. No acute apical findings. Other: Carotid calcifications. IMPRESSION: 1. No acute fracture or subluxation of the cervical spine. 2. Anterior fusion hardware C4 through C7 without evidence of complication. Electronically Signed   By: Keith Rake M.D.   On: 05/27/2022 21:01   CT Head Wo Contrast  Result Date: 05/27/2022 CLINICAL DATA:  Head trauma, abnormal mental status (Age  74-64y) Fall today, hypoglycemia. EXAM: CT HEAD WITHOUT CONTRAST TECHNIQUE: Contiguous axial images were obtained from the base of the skull through the vertex without intravenous contrast. RADIATION DOSE REDUCTION: This exam was performed according to the departmental dose-optimization program which includes automated exposure control, adjustment of the mA and/or kV according to patient size and/or use of iterative reconstruction technique. COMPARISON:  Head CT 06/25/2019 FINDINGS: Brain: No intracranial hemorrhage, mass effect, or midline shift. Stable brain volume. No hydrocephalus. The basilar cisterns are patent. Mild periventricular and deep white matter hypodensity typical of chronic small vessel ischemia. No evidence of territorial infarct or acute ischemia. No extra-axial or intracranial fluid collection. Vascular: Atherosclerosis of skullbase vasculature without hyperdense vessel or abnormal calcification. Skull: No fracture or focal lesion. Sinuses/Orbits: Paranasal sinuses and mastoid air cells are clear. The visualized orbits are unremarkable. Other: No confluent scalp hematoma. IMPRESSION: 1. No acute intracranial abnormality. No skull fracture. 2. Stable mild atrophy and chronic small vessel ischemia. Electronically Signed   By: Keith Rake M.D.   On: 05/27/2022 20:48    Procedures Procedures    Medications Ordered in ED Medications  sodium chloride flush (NS) 0.9 % injection 3 mL (has no administration in time range)  sodium chloride flush (NS) 0.9 % injection 3 mL (has no administration in time range)  0.9 %  sodium chloride infusion (has no administration in time range)  dextrose 50 % solution 50 mL (has no administration in time range)    ED Course/ Medical Decision Making/ A&P Clinical Course as of 05/27/22 2304  Fri May 27, 2022  2007 Glucose(!): 64 [AC]  2016 Glucose(!): 57 [AC]  2159 Glucose-Capillary(!): 158 [AC]    Clinical Course User Index [AC] Prince Rome, PA-C                           Medical Decision Making Amount and/or Complexity of Data Reviewed Labs: ordered. Decision-making details documented in ED Course. Radiology: ordered.  Risk Prescription drug management.   66 y.o. male presents to the ED for concern of Hypoglycemia     This involves an extensive number of treatment options, and is a complaint that carries with it a high risk of complications and morbidity.  The emergent differential diagnosis prior to evaluation includes, but is  not limited to: Hypoglycemia, encephalopathy, ICH, dehydration  This is not an exhaustive differential.   Past Medical History / Co-morbidities / Social History: Hx of HTN, DMT2, BPH, hyperlipidemia, ED, prior lacunar CVA, prostate malignancy, asymmetrical sensorineural hearing loss with disequilibrium.  Also with decreased appetite and fluid intake. Social Determinants of Health include: Elderly  Additional History:  Obtained by chart review.  Notably lacunar stroke identified June 2023, has been following up with physical medicine  Lab Tests: I ordered, and personally interpreted labs.  The pertinent results include:   Glucose 57, then 158 after D50 since ED arrival Drop in H&H in last three days from 14.0-41.8 to 12.1/37.4  Imaging Studies: I ordered imaging studies including CT head and neck.   I independently visualized and interpreted imaging which showed no evidence of ICH or acute fracture.  Prior cervical fusion noted without evidence of complication I agree with the radiologist interpretation.  Cardiac Monitoring: The patient was maintained on a cardiac monitor.  I personally viewed and interpreted the cardiac monitored which showed an underlying rhythm of: NSR  ED Course / Critical Interventions: Pt well-appearing on exam.  Nonseptic appearing in NAD.  Presenting today due to concerns of hypoglycemia and neck pain due to fall.  Per nursing staff/EMS, patient's wife heard him  fall from the other room.  She came in and found him on the floor.  Called for EMS.  Checked blood glucose, which was at 50, D50 was given.  Blood glucose rechecked at 220.  Patient initially complaining of neck pain after the fall.  Patient reports he does not remember falling, nor much of today's events in general.  Last remembers feeding his dog yesterday and spending time at home with his wife though unable to recall events of such.  Does not remember eating today or dinner last night.  Unable to recall if took insulin today.   Alert and oriented to self, place, and time, however not to event.  Blood glucose rechecked and was now 80 upon arrival.  Patient denies N/V/D, abdominal pain, recent URI symptoms, urinary symptoms, changes in bowel habits, chest pain, shortness of breath, lightheadedness.  Does endorse decreased appetite and food/fluid intake over the last few days in general however unable to elaborate.  Denies recent alcohol/drug use.  Difficult to get an accurate depiction of events due to patient's apparent lapse in memory Per my review of records, patient has been followed by physical medicine and rehabilitation, psychology, and oncology since June 2023 due to lacunar stroke with memory loss and cervical myelopathy.  See notes for details, however in short recommended by neurology for neuropsychological evaluation.  Noted difficulty with short-term memory and word finding and expressive language.  Noted increasing dizziness and balance issues with falls.  Recent evaluation demonstrated significant neurocognitive difficulties, as referenced in note from 04/05/2022.  "His performance on essentially every measure in cognitive domain assessed were severely impaired." Upon reevaluation, pt status unchanged.  Pt's wife attempted to make contact with ED.  Tried to call her back and received no answer.  Unable to leave VM.  CT head and neck negative for acute findings.  Glucose has improved following D50  administration, however pt mentation appears unchanged.  No anion gap.  Afebrile.  Electrolytes unremarkable.  Without evidence of AKI, UA pending.  Does not meet SIRS or sepsis criteria at this time.   Disposition: 2217  care of Blandon Offerdahl transferred to [facility] at the end of my shift as  the patient will require reassessment once labs/imaging have resulted.  Patient presentation, ED course, and plan of care discussed with review of all pertinent labs and imaging.  Please see his/her note for further details regarding further ED course and disposition.   Plan at time of handoff is likely admission for hypoglycemia and possible confusion, though still early in workup and difficult to differentiate new vs pre-existing memory loss.  Plan to attempt recontacting spouse for more accurate history.  Still early in management.  Continue to manage blood glucose.  If becomes hypoglycemic again after this second D50, may consider D10 IV.  May also consider further imaging/lab work as appropriate.  This may be altered or completely changed at the discretion of the oncoming team pending results of further workup.  I discussed this case with my attending, Dr. Mayra Neer, who agreed with the proposed treatment course and cosigned this note including patient's presenting symptoms, physical exam, and planned diagnostics and interventions.  Attending physician stated agreement with plan or made changes to plan which were implemented.     This chart was dictated using voice recognition software.  Despite best efforts to proofread, errors can occur which can change the documentation meaning.         Final Clinical Impression(s) / ED Diagnoses Final diagnoses:  Hypoglycemia  Fall, initial encounter  Neck pain    Rx / DC Orders ED Discharge Orders     None         Prince Rome, PA-C 25/63/89 2349    Audley Hose, MD 06/06/22 [phone]

## 2022-05-27 NOTE — ED Triage Notes (Signed)
Pt. BIB [facility] for hypoglycemia. On EMS arrival pt. Had a CBG of 50. Pt. Did have a fall leading wife to call for EMS. Per EMS pt. Has not been eating as he usually does. Given D50. Pt. Blood sugar went up to to 220 then went back down to 140 in the prior to arrival to the ED.

## 2022-05-28 LAB — URINALYSIS, MICROSCOPIC (REFLEX)
Bacteria, UA: NONE SEEN
RBC / HPF: NONE SEEN RBC/hpf (ref 0–5)
Squamous Epithelial / HPF: NONE SEEN (ref 0–5)
WBC, UA: NONE SEEN WBC/hpf (ref 0–5)

## 2022-05-28 LAB — URINALYSIS, ROUTINE W REFLEX MICROSCOPIC
Bilirubin Urine: NEGATIVE
Glucose, UA: >=500 mg/dL — AB
Ketones, ur: NEGATIVE mg/dL
Leukocytes,Ua: NEGATIVE
Nitrite: NEGATIVE
Protein, ur: NEGATIVE mg/dL
Specific Gravity, Urine: 1.02 (ref 1.005–1.030)
pH: 7.5 (ref 5.0–8.0)

## 2022-05-28 LAB — CBG MONITORING, ED: Glucose-Capillary: 171 mg/dL — ABNORMAL HIGH (ref 70–99)

## 2022-05-28 NOTE — Discharge Instructions (Signed)
You were seen in the emergency department today due to low blood sugar.  Your blood sugar with the ambulance staff and it sometimes during the ED was low in the 50s, this is abnormal.  You were given glucose as well as food to help improve this.  Like you to monitor your blood sugars closely at home.  Please check them with each meal as well as prior to bedtime.  Please be sure to eat 3 full meals per day as well as snacks as needed to avoid low blood sugar.  We would like you to call your primary care provider for send Monday morning to discuss close follow-up for possible adjustments of your diabetes medications.   Your left foot x-ray showed fractures in your first toe as well as your third toe bones placed you in a cam walker for this.  Please wear the boot at all times.  You may put weight on this as you are able.  Keep this elevated.  Apply ice wrapped in a towel 20 minutes on 40 minutes off.  Try to rest.  We would like you to follow-up with orthopedics for this, please call to make an appointment, if you already see an orthopedic specialist you may follow-up with your doctor.   Return to the emergency department for new or worsening symptoms including but not limited to new or worsening pain, passing out, seizure activity, fever, high blood sugar, low blood sugar, or any other concerns.

## 2022-05-31 ENCOUNTER — Ambulatory Visit: Payer: Medicare HMO | Admitting: Podiatry

## 2022-05-31 ENCOUNTER — Ambulatory Visit
Admission: RE | Admit: 2022-05-31 | Discharge: 2022-05-31 | Disposition: A | Discharge status: Home / Self Care | Payer: Medicare HMO | Source: Ambulatory Visit | Attending: Endocrinology | Admitting: Endocrinology

## 2022-05-31 DIAGNOSIS — M899 Disorder of bone, unspecified: Secondary | ICD-10-CM

## 2022-06-01 ENCOUNTER — Encounter: Payer: Medicare HMO | Admitting: Psychology

## 2022-06-03 ENCOUNTER — Encounter: Payer: Medicare HMO | Attending: Psychology | Admitting: Psychology

## 2022-06-03 DIAGNOSIS — R413 Other amnesia: Secondary | ICD-10-CM | POA: Insufficient documentation

## 2022-06-03 DIAGNOSIS — H9193 Unspecified hearing loss, bilateral: Secondary | ICD-10-CM

## 2022-06-03 DIAGNOSIS — F0280 Dementia in other diseases classified elsewhere without behavioral disturbance: Secondary | ICD-10-CM | POA: Diagnosis not present

## 2022-06-03 DIAGNOSIS — I6381 Other cerebral infarction due to occlusion or stenosis of small artery: Secondary | ICD-10-CM

## 2022-06-03 DIAGNOSIS — I6782 Cerebral ischemia: Secondary | ICD-10-CM | POA: Diagnosis not present

## 2022-06-06 ENCOUNTER — Other Ambulatory Visit: Payer: Medicare HMO

## 2022-06-06 ENCOUNTER — Telehealth: Payer: Self-pay | Admitting: Hematology and Oncology

## 2022-06-06 ENCOUNTER — Encounter: Payer: Medicare HMO | Admitting: Hematology and Oncology

## 2022-06-06 NOTE — Telephone Encounter (Signed)
Pt's wife called in to r/s new hem appt. Appt was marked as no show due to less than 24 hr notice. Pt's wife is aware of new appt date/time.

## 2022-06-07 ENCOUNTER — Encounter: Payer: Self-pay | Admitting: Podiatry

## 2022-06-07 ENCOUNTER — Ambulatory Visit: Payer: Medicare HMO | Admitting: Podiatry

## 2022-06-07 DIAGNOSIS — E1142 Type 2 diabetes mellitus with diabetic polyneuropathy: Secondary | ICD-10-CM | POA: Diagnosis not present

## 2022-06-07 DIAGNOSIS — B351 Tinea unguium: Secondary | ICD-10-CM

## 2022-06-07 DIAGNOSIS — M79674 Pain in right toe(s): Secondary | ICD-10-CM | POA: Diagnosis not present

## 2022-06-07 DIAGNOSIS — M79675 Pain in left toe(s): Secondary | ICD-10-CM

## 2022-06-12 ENCOUNTER — Encounter: Payer: Self-pay | Admitting: Podiatry

## 2022-06-12 NOTE — Progress Notes (Signed)
  Subjective:  Patient ID: George Franco, male    DOB: 1956-06-13,  MRN: [id number]  George Franco presents to clinic today for at risk foot care with history of diabetic neuropathy and painful, discolored, thick toenails which interfere with daily activities  Chief Complaint  Patient presents with   Nail Problem    Nail Trim  BG - 167 A1C- Pt does not recall  PCP - Dr Forde Dandy   New problem(s): Patient is accompanied by his wife on today's visit. Patient states he had a fall and fractured his left foot. He was seen in ED, given a walking boot and told to follow up with Podiatrist.  PCP is [name], MD , and last visit was Dec 09, 2021.  No Known Allergies  Review of Systems: Negative except as noted in the HPI.  Objective: No changes noted in today's physical examination.  George Franco is a pleasant 66 y.o. male WD, WN in NAD. AAO x 3.  Vascular Examination: Capillary refill time <3 seconds RLE.vVascular status intact RLE with palpable pedal pulses. Pedal hair absent RLE. No edema RLE. No pain with calf compression RLE. Skin temperature gradient WNL RLE. Wearing walking boot LLE and refuses examination.  Neurological Examination: Pt has subjective symptoms of neuropathy. Protective sensation intact with 10 gram monofilament right lower extremity. Vibratory sensation intact right lower extremity. Proprioception intact bilaterally.  Dermatological Examination: RLE: Pedal skin with normal turgor, texture and tone. Toenails 1-5 thick, discolored, elongated with subungual debris and pain on dorsal palpation. No open wounds right lower extremity. No interdigital macerations right lower extremity. No hyperkeratotic nor porokeratotic lesions present on today's visit.  Musculoskeletal Examination: Muscle strength 5/5 to all LE muscle groups of right lower extremity. Wearing walking boot LLE. Hallux valgus with bunion deformity noted right lower extremity. Hammertoe(s) noted to the right  lower extremity.  Radiographs: None  Last A1c:      Latest Ref Rng & Units 10/26/2021    2:50 PM  Hemoglobin A1C  Hemoglobin-A1c 4.8 - 5.6 % 6.5    Assessment/Plan: 1. Pain due to onychomycosis of toenails of both feet   2. Diabetic peripheral neuropathy associated with type 2 diabetes mellitus ([place])     No orders of the defined types were placed in this encounter.   -Patient was evaluated and treated. All patient's and/or POA's questions/concerns answered on today's visit. -Patient with h/o fracture left foot; will see Dr. Posey Pronto for xrays/follow up. Refused examination of LLE today. -Toenails 1-5 right foot debrided in length and girth without iatrogenic bleeding with sterile nail nipper and dremel.  -Patient scheduled to see Dr. Boneta Lucks in our clinic for follow up of fracture left foot. -Patient/POA to call should there be question/concern in the interim.   Return in about 3 months (around 09/07/2022).  Marzetta Board, DPM

## 2022-06-13 ENCOUNTER — Ambulatory Visit: Payer: Medicare HMO | Admitting: Psychology

## 2022-06-16 ENCOUNTER — Ambulatory Visit: Payer: Medicare HMO | Admitting: Podiatry

## 2022-06-16 DIAGNOSIS — S92902A Unspecified fracture of left foot, initial encounter for closed fracture: Secondary | ICD-10-CM | POA: Diagnosis not present

## 2022-06-16 NOTE — Progress Notes (Signed)
Subjective:  Patient ID: George Franco, male    DOB: 1955-11-12,  MRN: [id number]  Chief Complaint  Patient presents with   Fracture    Left foot toe     66 y.o. male presents with the above complaint.  Patient presents with minimally displaced fracture of the left second and third metatarsal.  Patient states is mildly painful.  Hurts with ambulation hurts to pressure he wanted get it evaluated.  He does not really recall how it happened.  He states that he went toUrgent care and they saw the fracture and had a follow-up with [place]..  He denies any other acute complaints.   Review of Systems: Negative except as noted in the HPI. Denies N/V/F/Ch.  Past Medical History:  Diagnosis Date   At risk for sleep apnea    STOP-BANG= 5     SENT TO PCP 06-02-2015   BPH (benign prostatic hyperplasia)    CTS (carpal tunnel syndrome)    CTS (carpal tunnel syndrome)    ED (erectile dysfunction) of organic origin    Hyperlipidemia    Hypertension    Hypogonadism in male    Knee pain    pt said he doesn't know anything about this   Lower urinary tract symptoms (LUTS)    LVH (left ventricular hypertrophy)    Persistent vertigo of central origin    Prostate cancer [facility]) urologist-  dr ottelin/  oncologist-  [name]   Stage T1c, Gleason 4+3, PSA 4.48,  vol 32.9cc--  External RXT complete 05-08-2015   Stroke ([place])    Type 2 diabetes mellitus ([place])    Vestibular neuropathy    Wears glasses     Current Outpatient Medications:    aspirin EC 81 MG tablet, Take 1 tablet (81 mg total) by mouth daily., Disp: 30 tablet, Rfl: 0   benazepril (LOTENSIN) 40 MG tablet, Take 40 mg by mouth daily., Disp: , Rfl:    citalopram (CELEXA) 10 MG tablet, Take 10 mg by mouth daily., Disp: , Rfl:    ezetimibe (ZETIA) 10 MG tablet, Take 10 mg by mouth daily., Disp: , Rfl:    FARXIGA 10 MG TABS tablet, Take 10 mg by mouth daily., Disp: , Rfl:    HYDROcodone-acetaminophen (NORCO/VICODIN) 5-325 MG tablet, Take 1-2  tablets by mouth every 4 (four) hours as needed for moderate pain. (Patient not taking: Reported on 05/24/2022), Disp: 20 tablet, Rfl: 0   Insulin Glargine (BASAGLAR KWIKPEN) 100 UNIT/ML, Inject 26 Units into the skin daily., Disp: , Rfl:    loperamide (IMODIUM) 2 MG capsule, Take 2 mg by mouth every 6 (six) hours as needed for diarrhea or loose stools., Disp: , Rfl: 0   meclizine (ANTIVERT) 25 MG tablet, Take 1 tablet (25 mg total) by mouth 3 (three) times daily as needed for dizziness., Disp: 30 tablet, Rfl: 2   methocarbamol (ROBAXIN) 500 MG tablet, Take 1 tablet (500 mg total) by mouth 4 (four) times daily. (Patient not taking: Reported on 05/24/2022), Disp: 90 tablet, Rfl: 2   NOVOLOG FLEXPEN 100 UNIT/ML FlexPen, Inject 22 Units into the skin 3 (three) times daily with meals., Disp: , Rfl: 6   ONETOUCH VERIO test strip, USE TO TEST BLOOD SUGAR 3 TIMES A DAY PRIOR TO MEALS AND RECORD, Disp: , Rfl:    rosuvastatin (CRESTOR) 20 MG tablet, Take 20 mg by mouth daily., Disp: , Rfl:   Social History   Tobacco Use  Smoking Status Every Day   Packs/day: 0.50  Years: 20.00   Total pack years: 10.00   Types: Cigarettes  Smokeless Tobacco Never    No Known Allergies Objective:  There were no vitals filed for this visit. There is no height or weight on file to calculate BMI. Constitutional Well developed. Well nourished.  Vascular Dorsalis pedis pulses palpable bilaterally. Posterior tibial pulses palpable bilaterally. Capillary refill normal to all digits.  No cyanosis or clubbing noted. Pedal hair growth normal.  Neurologic Normal speech. Oriented to person, place, and time. Epicritic sensation to light touch grossly present bilaterally.  Dermatologic Nails well groomed and normal in appearance. No open wounds. No skin lesions.  Orthopedic: Pain on palpation to the left second and third metatarsal head.  Mild pain with range of motion of the second and third metatarsal.  No ecchymosis  noted no bruising noted.  No open wounds or lesion noted.   Radiographs: None Assessment:   1. Closed fracture of left foot, initial encounter    Plan:  Patient was evaluated and treated and all questions answered.  Left second and third metatarsal fracture minimally displaced -All questions and concerns were discussed with the patient in extensive detail given the amount of pain that he is experiencing he will benefit from cam boot immobilization that he has received from urgent care will take about 4 to 6 weeks to completely resolve. -I discussed with the patient that he does not need any surgical intervention at this time.  No follow-ups on file.    Second and third metatarsal fracture minimally displaced.  Continue cam boot for next 4 weeks and then transition out no pain

## 2022-06-22 ENCOUNTER — Telehealth: Payer: Self-pay | Admitting: *Deleted

## 2022-06-22 ENCOUNTER — Inpatient Hospital Stay: Payer: Medicare HMO

## 2022-06-22 ENCOUNTER — Inpatient Hospital Stay: Payer: Medicare HMO | Admitting: Hematology and Oncology

## 2022-06-22 NOTE — Telephone Encounter (Signed)
TCT patient's home regarding his appt for today. Spoke to pt's wife. Advised that Dr. Lorenso Courier recommends moving her husband's appt to after his bone biopsy that is scheduled for 06/23/22. That way there would results to review. She is in agreement.  Scheduling message sent to move pt's appt to the week after thanksgiving

## 2022-06-23 ENCOUNTER — Other Ambulatory Visit (HOSPITAL_COMMUNITY)
Admission: RE | Admit: 2022-06-23 | Discharge: 2022-06-23 | Disposition: A | Discharge status: Home / Self Care | Payer: Medicare HMO | Source: Ambulatory Visit | Attending: Endocrinology | Admitting: Endocrinology

## 2022-06-23 ENCOUNTER — Ambulatory Visit
Admission: RE | Admit: 2022-06-23 | Discharge: 2022-06-23 | Disposition: A | Discharge status: Home / Self Care | Payer: Medicare HMO | Source: Ambulatory Visit | Attending: Endocrinology | Admitting: Endocrinology

## 2022-06-23 ENCOUNTER — Other Ambulatory Visit: Payer: Self-pay | Admitting: Endocrinology

## 2022-06-23 DIAGNOSIS — M899 Disorder of bone, unspecified: Secondary | ICD-10-CM | POA: Insufficient documentation

## 2022-06-23 DIAGNOSIS — S22050A Wedge compression fracture of T5-T6 vertebra, initial encounter for closed fracture: Secondary | ICD-10-CM | POA: Diagnosis not present

## 2022-06-23 DIAGNOSIS — C7951 Secondary malignant neoplasm of bone: Secondary | ICD-10-CM | POA: Diagnosis not present

## 2022-06-23 DIAGNOSIS — M8458XA Pathological fracture in neoplastic disease, other specified site, initial encounter for fracture: Secondary | ICD-10-CM | POA: Diagnosis not present

## 2022-06-23 DIAGNOSIS — M898X8 Other specified disorders of bone, other site: Secondary | ICD-10-CM | POA: Diagnosis not present

## 2022-06-23 HISTORY — PX: IR BONE TUMOR(S)RF ABLATION: IMG2284

## 2022-06-23 HISTORY — PX: IR KYPHO THORACIC WITH BONE BIOPSY: IMG5518

## 2022-06-23 MED ORDER — CEFAZOLIN SODIUM-DEXTROSE 2-4 GM/100ML-% IV SOLN
2.0000 g | INTRAVENOUS | Status: AC
  Administered 2022-06-23: 2 g via INTRAVENOUS

## 2022-06-23 MED ORDER — KETOROLAC TROMETHAMINE 30 MG/ML IJ SOLN: 30.0000 mg | Freq: Once | INTRAMUSCULAR | Status: DC

## 2022-06-23 MED ORDER — MIDAZOLAM HCL 2 MG/2ML IJ SOLN
1.0000 mg | INTRAMUSCULAR | Status: DC | PRN
  Administered 2022-06-23 (×4): 1 mg via INTRAVENOUS

## 2022-06-23 MED ORDER — FENTANYL CITRATE PF 50 MCG/ML IJ SOSY
25.0000 ug | PREFILLED_SYRINGE | INTRAMUSCULAR | Status: DC | PRN
  Administered 2022-06-23 (×2): 50 ug via INTRAVENOUS

## 2022-06-23 MED ORDER — SODIUM CHLORIDE 0.9 % IV SOLN: INTRAVENOUS | Status: DC

## 2022-06-23 NOTE — Progress Notes (Signed)
Pt back in nursing recovery area. Pt still drowsy from procedure but will wake up when spoken to. Pt follows commands, talks in complete sentences and has no complaints at this time. Pt will remain in nursing station until discharge.  ?

## 2022-06-23 NOTE — Discharge Instructions (Addendum)
Kyphoplasty Post Procedure Discharge Instructions  May resume a regular diet and any medications that you routinely take (including pain medications). However, if you are taking Aspirin or an anticoagulant/blood thinner you will be told when you can resume taking these by the healthcare provider. No driving day of procedure. The day of your procedure take it easy. You may use an ice pack as needed to injection sites on back.  Ice to back 30 minutes on and 30 minutes off, as needed. May remove bandaids tomorrow after taking a shower. Replace daily with a clean bandaid until healed.  Do not lift anything heavier than a milk jug for 1-2 weeks or determined by your physician.  Follow up with your physician in 2 weeks.    Please contact our office at [phone] for the following symptoms or if you have any questions:  Fever greater than 100 degrees Increased swelling, pain, or redness at injection site. Increased back and/or leg pain New numbness or change in symptoms from before the procedure.    Thank you for visiting [facility].  May resume aspirin immediately after procedure. 

## 2022-06-27 NOTE — Progress Notes (Signed)
Phone call to pt to follow up from his kyphoplasty on 06/23/22. Pt reports her pain is better post procedure but is still having "a little soreness". Pt reports he is moving around about the same and had a fall the day after his procedure but jumped right back up and feels okay. Pt denies any signs of infection, redness at the site, draining or fever. Pt has no complaints at this time and will be scheduled for a telephone follow up with Dr. Dwaine Gale next week. Pt advised to call back if anything were to change or any concerns arise and we will arrange an in person appointment. Pt verbalized understanding.

## 2022-06-28 LAB — SURGICAL PATHOLOGY

## 2022-06-29 ENCOUNTER — Other Ambulatory Visit: Payer: Self-pay | Admitting: Interventional Radiology

## 2022-06-29 DIAGNOSIS — S22050A Wedge compression fracture of T5-T6 vertebra, initial encounter for closed fracture: Secondary | ICD-10-CM

## 2022-07-03 ENCOUNTER — Encounter: Payer: Self-pay | Admitting: Psychology

## 2022-07-03 NOTE — Progress Notes (Signed)
06/03/2022 4 PM-5 PM:  Today's visit was an in person visit conducted in my outpatient clinic office.  Today I provided feedback regarding the results of the recent neuropsychological evaluation.  I have included the reason for service in this note as well as a copy of the impression/recommendations below for convenience.  His full neuropsychological evaluation can be found in the patient's EMR dated 04/05/2022.  Reason For Service:                          George Franco is a 66 year old male referred for neuropsychological evaluation by [name], NP as part of his overall neurological care.  The patient has been followed by Dr. Jaynee Eagles as well.  He has been followed due to increasing issues with dizziness and balance issues with falls.  Patient has previously been referred for vestibular PT but stopped after 1 visit.  The patient and his wife describe increasing issues with dizziness but also issues related to memory difficulties including short-term memory issues that do sometimes improve with cueing and also difficulties with word finding and expressive language changes.    Impression/Diagnosis:                     The results of the current neuropsychological evaluation suggest that essentially all cognitive domains assessed are significantly and severely impaired.  However some of these impairments are likely exacerbated and worsened by vision and hearing deficits and difficulties with executive functioning.  Significant and severe attentional deficits are noted which are also exacerbating some of the other cognitive deficits.  There were objective findings of significant changes in expressive language for both lexical fluency and semantic fluency.  Significant global cognitive deficits are noted.  The patient shows significant encoding deficits for both auditory and visual encoding which are likely playing a major role in his memory deficits although he shows significant difficulty with storage and  organization as well although he improved his performance under recognition/cued recall and displayed somewhat better performance relative to normative population for delayed memory versus immediate memory.  While all of these were impaired the pattern of strengths and weaknesses with regard to his memory functions would suggest that the biggest issue has to do with encoding information and retrieval of information that while limited is very difficult even if the information is initially encoded and stored/organized.  The patient has had past history of some type of depressive symptomatology but denies any significant depression or anxiety types of symptoms.  Patient has a number of medical issues of note including difficulty managing his type 2 diabetes and the patient does take insulin.  Hypertension, hyperlipidemia, significant pain, history of prostate cancer and metabolic disturbance are all noted.  The patient is had long-term issues with dizziness and a history of significant poor sleep patterns.  However, his sleep appears to be significantly impacted by pain and after his most recent cervical fusion surgery reports that he is sleeping better.  Given his risk factors for obstructive sleep apnea there is a significant risk for potential obstructive sleep apnea.   As far as diagnostic considerations, the patient is clearly having significant cognitive deficits consistent with subjective reports.  The patient shows multiple domains of cognitive deficits and would need the diagnostic criterion for a major neurocognitive disorder due to cerebrovascular factors.  The specific clinical presentation and subjective reports along with the pattern of cognitive strengths and weaknesses on objective neuropsychological testing are not  consistent with those typically seen with progressive cortical neurological condition such as Alzheimer's, Lewy body or other cortical dementia and the patient does not show patterns  consistent with other conditions such as Parkinson's or subcortical dementia's.  The patient has had 3 previous MRIs now with the most recent MRI performed in December 2022.  Radiological interpretation suggest the possibility of chronic lacunar infarctions and other T2/FLAIR hyperintense foci in hemispheric white matter and brainstem regions consistent with chronic microvascular ischemic changes and small vessel disease.  The patient also had findings that were new to the 2022 MRI of a chronic microhemorrhage in the right occipital lobe.  I suspect that the primary etiological factor creating progressive changes in cognitive functioning are related to significant microvascular ischemic changes and potential small lacunar infarctions.  This combined with significant pain conditions, dizziness and vertigo, sustained sleep disturbance, and peripheral neuropathy secondary to type 2 diabetes that is insulin-dependent are also having a contributing factor.  Other issues of concern of the possibility of an underlying obstructive sleep apnea.   I would recommend that the patient have assessment for possible obstructive sleep apnea.  While the level of cognitive deficits could not be explained by this factor alone it is something that given his issues with hypertension, indications of microvascular ischemic changes/small vessel disease, memory and cognitive deficits would suggest that this assessment would be warranted with the patient having multiple risk factors for obstructive sleep apnea.  I will sit down with the patient and his wife and go over the results of the current neuropsychological evaluation.   Diagnosis:                                Major neurocognitive disorder due to another medical condition [facility])   Memory loss   Lacunar stroke ([place])   Subcortical microvascular ischemic occlusive disease     _____________________ [name], Psy.D. Clinical Neuropsychologist

## 2022-07-06 ENCOUNTER — Inpatient Hospital Stay: Payer: Medicare HMO

## 2022-07-06 ENCOUNTER — Inpatient Hospital Stay: Payer: Medicare HMO | Admitting: Hematology and Oncology

## 2022-07-07 ENCOUNTER — Ambulatory Visit
Admission: RE | Admit: 2022-07-07 | Discharge: 2022-07-07 | Disposition: A | Discharge status: Home / Self Care | Payer: Medicare HMO | Source: Ambulatory Visit | Attending: Interventional Radiology | Admitting: Interventional Radiology

## 2022-07-07 DIAGNOSIS — S22050A Wedge compression fracture of T5-T6 vertebra, initial encounter for closed fracture: Secondary | ICD-10-CM

## 2022-07-07 DIAGNOSIS — Z9889 Other specified postprocedural states: Secondary | ICD-10-CM | POA: Diagnosis not present

## 2022-07-07 DIAGNOSIS — S22050D Wedge compression fracture of T5-T6 vertebra, subsequent encounter for fracture with routine healing: Secondary | ICD-10-CM | POA: Diagnosis not present

## 2022-07-07 HISTORY — PX: IR RADIOLOGIST EVAL & MGMT: IMG5224

## 2022-07-07 NOTE — Progress Notes (Signed)
Chief Complaint: Traumatic T6 compression fracture  Referring Physician(s): Dr. [name]  History of Present Illness: George Franco is a 66 y.o. male with past medical history of prostate malignancy, experienced a fall in June 2023. He had continued intractable back pain and subsequent MRI in September 2023 demonstrated subacute T6 compression fracture.  MRI also raised concern for possible metastatic lesion.    He underwent T6 biopsy, Osteocool, and kyphoplasty on 06/23/2022.  Biopsy did not show any evidence of metastatic lesions.  He states that his pain is completely resolved.  He does not have any new pain.  He denies any fever or chills.   Past Medical History:  Diagnosis Date   At risk for sleep apnea    STOP-BANG= 5     SENT TO PCP 06-02-2015   BPH (benign prostatic hyperplasia)    CTS (carpal tunnel syndrome)    CTS (carpal tunnel syndrome)    ED (erectile dysfunction) of organic origin    Hyperlipidemia    Hypertension    Hypogonadism in male    Knee pain    pt said he doesn't know anything about this   Lower urinary tract symptoms (LUTS)    LVH (left ventricular hypertrophy)    Persistent vertigo of central origin    Prostate cancer [facility]) urologist-  dr ottelin/  oncologist-  [name]   Stage T1c, Gleason 4+3, PSA 4.48,  vol 32.9cc--  External RXT complete 05-08-2015   Stroke ([place])    Type 2 diabetes mellitus ([place])    Vestibular neuropathy    Wears glasses     Past Surgical History:  Procedure Laterality Date   ANTERIOR CERVICAL DECOMP/DISCECTOMY FUSION N/A 11/02/2021   Procedure: Anterior Cervical Decompression Fusion, Cervical four-five, Cervical five-six, Cervical six-seven;  Surgeon: Dawley, Theodoro Doing, DO;  Location: [facility];  Service: Neurosurgery;  Laterality: N/A;   COLONOSCOPY     IR ANGIOGRAM EXTREMITY RIGHT  03/05/2020   IR BONE TUMOR(S)RF ABLATION  06/23/2022   IR FEM POP ART PTA MOD SED  03/05/2020   IR KYPHO THORACIC WITH BONE BIOPSY   06/23/2022   IR RADIOLOGIST EVAL & MGMT  02/26/2020   IR RADIOLOGIST EVAL & MGMT  04/15/2020   IR RADIOLOGIST EVAL & MGMT  07/22/2020   IR RADIOLOGIST EVAL & MGMT  05/24/2022   IR TIB-PERO ART ATHEREC INC PTA MOD SED  03/05/2020   IR US GUIDE VASC ACCESS RIGHT  03/05/2020   PROSTATE BIOPSY     RADIOACTIVE SEED IMPLANT N/A 06/05/2015   Procedure: RADIOACTIVE SEED IMPLANT/BRACHYTHERAPY IMPLANT;  Surgeon: [name], MD;  Location: [place] [facility];  Service: Urology;  Laterality: N/A;   ROTATOR CUFF REPAIR Right [id number]   UMBILICAL HERNIA REPAIR  1990's   UPPER GASTROINTESTINAL ENDOSCOPY     wisdom teeth extrat      Allergies: Patient has no known allergies.  Medications: Prior to Admission medications   Medication Sig Start Date End Date Taking? Authorizing Provider  aspirin EC 81 MG tablet Take 1 tablet (81 mg total) by mouth daily. 06/05/19   Melvenia Beam, MD  benazepril (LOTENSIN) 40 MG tablet Take 40 mg by mouth daily.    [provider]  citalopram (CELEXA) 10 MG tablet Take 10 mg by mouth daily. 07/02/21   [provider]  ezetimibe (ZETIA) 10 MG tablet Take 10 mg by mouth daily. 07/13/19   [provider]  FARXIGA 10 MG TABS tablet Take 10 mg by mouth  daily. 12/29/17   [provider]  HYDROcodone-acetaminophen (NORCO/VICODIN) 5-325 MG tablet Take 1-2 tablets by mouth every 4 (four) hours as needed for moderate pain. Patient not taking: Reported on 05/24/2022 11/04/21 11/04/22  Marvis Moeller, NP  Insulin Glargine [facility]) 100 UNIT/ML Inject 26 Units into the skin daily.    [provider]  loperamide (IMODIUM) 2 MG capsule Take 2 mg by mouth every 6 (six) hours as needed for diarrhea or loose stools. 01/08/18   [provider]  meclizine (ANTIVERT) 25 MG tablet Take 1 tablet (25 mg total) by mouth 3 (three) times daily as needed for dizziness. 12/30/20   Melvenia Beam, MD  methocarbamol (ROBAXIN) 500 MG  tablet Take 1 tablet (500 mg total) by mouth 4 (four) times daily. Patient not taking: Reported on 05/24/2022 11/04/21   Marvis Moeller, NP  NOVOLOG FLEXPEN 100 UNIT/ML FlexPen Inject 22 Units into the skin 3 (three) times daily with meals. 12/12/17   [provider]  ONETOUCH VERIO test strip USE TO TEST BLOOD SUGAR 3 TIMES A DAY PRIOR TO MEALS AND RECORD 03/22/19   [provider]  rosuvastatin (CRESTOR) 20 MG tablet Take 20 mg by mouth daily. 07/19/19   [provider]     Family History  Problem Relation Age of Onset   Prostate cancer Father    Diabetes Father    Diabetes Mother    Diabetes Brother    Colon cancer Neg Hx    Esophageal cancer Neg Hx    Liver cancer Neg Hx    Pancreatic cancer Neg Hx    Rectal cancer Neg Hx    Stomach cancer Neg Hx     Social History   Socioeconomic History   Marital status: Married    Spouse name: Not on file   Number of children: 3   Years of education: Not on file   Highest education level: Not on file  Occupational History   Occupation: Glass blower/designer    Employer: [employer]  Tobacco Use   Smoking status: Every Day    Packs/day: 0.50    Years: 20.00    Total pack years: 10.00    Types: Cigarettes   Smokeless tobacco: Never  Vaping Use   Vaping Use: Never used  Substance and Sexual Activity   Alcohol use: Yes    Alcohol/week: 2.0 standard drinks of alcohol    Types: 2 Shots of liquor per week   Drug use: No   Sexual activity: Yes    Partners: Female  Other Topics Concern   Not on file  Social History Narrative   Married, 2 sons one daughter   Retired    Caffeine: none    0-1 alcoholic drinks/week   He is a smoker   No drug use   Lives at home with wife   Social Determinants of Health   Financial Resource Strain: Not on file  Food Insecurity: Not on file  Transportation Needs: Not on file  Physical Activity: Not on file  Stress: Not on file  Social Connections: Not on file   Review  of Systems  Review of Systems: A 12 point ROS discussed and pertinent positives are indicated in the HPI above.  All other systems are negative.  Physical Exam No direct physical exam was performed (except for noted visual exam findings with Video Visits).   Vital Signs: There were no vitals taken for this visit.  Imaging: IR KYPHO THORACIC WITH BONE  BIOPSY  Result Date: 06/23/2022 INDICATION: 66 year old gentleman with history of prostate cancer and traumatic, possibly pathologic, T6 compression fracture presents to IR for kyphoplasty, vertebral body biopsy, and Osteocool RF ablation. EXAM: 1. T6 kyphoplasty 2. T6 RF ablation 3. T6 vertebral body biopsy COMPARISON:  None Available. MEDICATIONS: As antibiotic prophylaxis, Ancef 2 g IV was ordered pre-procedure and administered intravenously within 1 hour of incision. All current medications are in the EMR and have been reviewed as part of this encounter. ANESTHESIA/SEDATION: Moderate (conscious) sedation was employed during this procedure. A total of Versed 4 mg and Fentanyl 100 mcg was administered intravenously by the radiology nurse. Total intra-service moderate Sedation Time: 59 minutes. The patient's level of consciousness and vital signs were monitored continuously by radiology nursing throughout the procedure under my direct supervision. FLUOROSCOPY: Radiation Exposure Index (as provided by the fluoroscopic device): 470 mGy Kerma COMPLICATIONS: None immediate. PROCEDURE: Following a full explanation of the procedure along with the potential associated complications, an informed witnessed consent was obtained. The patient was positioned prone on the exam table. Bipedicular access was planned. Skin entry site(s) were marked overlying the T6 vertebral body using fluoroscopy. The overlying skin was then prepped and draped in the standard sterile fashion. Local analgesia was obtained with 0.5% Bupivacaine. Attention was first turned to the right  side. Under fluoroscopic guidance, a 10 gauge introducer needle was advanced towards the lateral margin of the pedicle. Using multiple projections, the introducer needle was advanced towards the posterior margin of the vertebral body via a transpedicular approach. The posterior margin of the ablation zone was then marked using the inner needle of the introducer. The inner needle was then removed, and a core needle biopsy of the vertebral body was performed using a 10 gauge core biopsy device. Small amount of tissue was retrieved and sent to the lab for analysis. Next, the marking drill was advanced towards the anterior margin of the vertebral body for RF probe selection. The 7 mm RF ablation probe was determined to be appropriate. Attention was then turned to the contralateral side. Under fluoroscopic guidance, a 10 gauge introducer needle was advanced towards the lateral margin of the pedicle. Using multiple projections, the introducer needle was advanced towards the posterior margin of the vertebral body via a transpedicular approach. Again, the posterior margin of the ablation zone was then marked using the inner needle of the introducer. The marking drill was then advanced towards the anterior margin of the vertebral body for RF probe selection. The 7 mm RF ablation probe was determined to be appropriate. The selected RF ablation probes were then advanced through the bilateral transpedicular access needles and locked into position. Appropriate location within the vertebral body was confirmed with multiple projections. RF ablation was then performed for 7.5 minutes. The patient tolerated this portion of the procedure well without significant discomfort. Both RF probes were removed. Partial fracture reduction and bony cavity creation was then performed using inflatable bone tamps through both cannulas. The bone tamps were removed, and vertebral augmentation was performed through both cannulas by careful  application of a total 3 mL of firm consistency bone cement under lateral fluoroscopic guidance. Cement extravasation was seen into the disc spaces above and below. The cannulas were removed and hemostasis achieved with manual compression. IMPRESSION: 1. Status post fluoroscopic-guided needle placement for deep core bone biopsy at T6. 2. Status post Osteocool RFA and Kyphoplasty of T6 vertebral body. If the patient has known osteoporosis, recommend treatment as clinically indicated. If  the patient's bone density status is unknown, DEXA scan is recommended. Electronically Signed   By: Miachel Roux M.D.   On: 06/23/2022 17:13   IR Bone Tumor(s)RF Ablation  Result Date: 06/23/2022 INDICATION: 66 year old gentleman with history of prostate cancer and traumatic, possibly pathologic, T6 compression fracture presents to IR for kyphoplasty, vertebral body biopsy, and Osteocool RF ablation. EXAM: 1. T6 kyphoplasty 2. T6 RF ablation 3. T6 vertebral body biopsy COMPARISON:  None Available. MEDICATIONS: As antibiotic prophylaxis, Ancef 2 g IV was ordered pre-procedure and administered intravenously within 1 hour of incision. All current medications are in the EMR and have been reviewed as part of this encounter. ANESTHESIA/SEDATION: Moderate (conscious) sedation was employed during this procedure. A total of Versed 4 mg and Fentanyl 100 mcg was administered intravenously by the radiology nurse. Total intra-service moderate Sedation Time: 59 minutes. The patient's level of consciousness and vital signs were monitored continuously by radiology nursing throughout the procedure under my direct supervision. FLUOROSCOPY: Radiation Exposure Index (as provided by the fluoroscopic device): 956 mGy Kerma COMPLICATIONS: None immediate. PROCEDURE: Following a full explanation of the procedure along with the potential associated complications, an informed witnessed consent was obtained. The patient was positioned prone on the exam  table. Bipedicular access was planned. Skin entry site(s) were marked overlying the T6 vertebral body using fluoroscopy. The overlying skin was then prepped and draped in the standard sterile fashion. Local analgesia was obtained with 0.5% Bupivacaine. Attention was first turned to the right side. Under fluoroscopic guidance, a 10 gauge introducer needle was advanced towards the lateral margin of the pedicle. Using multiple projections, the introducer needle was advanced towards the posterior margin of the vertebral body via a transpedicular approach. The posterior margin of the ablation zone was then marked using the inner needle of the introducer. The inner needle was then removed, and a core needle biopsy of the vertebral body was performed using a 10 gauge core biopsy device. Small amount of tissue was retrieved and sent to the lab for analysis. Next, the marking drill was advanced towards the anterior margin of the vertebral body for RF probe selection. The 7 mm RF ablation probe was determined to be appropriate. Attention was then turned to the contralateral side. Under fluoroscopic guidance, a 10 gauge introducer needle was advanced towards the lateral margin of the pedicle. Using multiple projections, the introducer needle was advanced towards the posterior margin of the vertebral body via a transpedicular approach. Again, the posterior margin of the ablation zone was then marked using the inner needle of the introducer. The marking drill was then advanced towards the anterior margin of the vertebral body for RF probe selection. The 7 mm RF ablation probe was determined to be appropriate. The selected RF ablation probes were then advanced through the bilateral transpedicular access needles and locked into position. Appropriate location within the vertebral body was confirmed with multiple projections. RF ablation was then performed for 7.5 minutes. The patient tolerated this portion of the procedure well  without significant discomfort. Both RF probes were removed. Partial fracture reduction and bony cavity creation was then performed using inflatable bone tamps through both cannulas. The bone tamps were removed, and vertebral augmentation was performed through both cannulas by careful application of a total 3 mL of firm consistency bone cement under lateral fluoroscopic guidance. Cement extravasation was seen into the disc spaces above and below. The cannulas were removed and hemostasis achieved with manual compression. IMPRESSION: 1. Status post fluoroscopic-guided needle placement  for deep core bone biopsy at T6. 2. Status post Osteocool RFA and Kyphoplasty of T6 vertebral body. If the patient has known osteoporosis, recommend treatment as clinically indicated. If the patient's bone density status is unknown, DEXA scan is recommended. Electronically Signed   By: Miachel Roux M.D.   On: 06/23/2022 17:13    Labs:  CBC: Recent Labs    10/26/21 1450 05/24/22 1530 05/27/22 2007 05/27/22 2016  WBC 6.3 8.3 7.1  --   HGB 12.9* 14.0 12.1* 13.6  HCT 40.2 41.8 37.4* 40.0  PLT 188 255 174  --     COAGS: No results for input(s): "INR", "APTT" in the last 8760 hours.  BMP: Recent Labs    10/26/21 1450 05/24/22 1530 05/27/22 2007 05/27/22 2016  NA 142 140 139 141  K 3.3* 4.1 3.9 4.1  CL 103 102 103 104  CO2 '29 26 28  '$ --   GLUCOSE 60* 73 64* 57*  BUN 5* '9 13 13  '$ CALCIUM 9.3 10.0 8.9  --   CREATININE 0.84 0.74 0.72 0.70  GFRNONAA >60  --  >60  --     LIVER FUNCTION TESTS: Recent Labs    05/24/22 1530  BILITOT 1.0  AST 21  ALT 16  PROT 8.0    TUMOR MARKERS: No results for input(s): "AFPTM", "CEA", "CA199", "CHROMGRNA" in the last 8760 hours.  Assessment and Plan:  66 year old gentleman with traumatic T6 compression fraction resulting in intractable back pain underwent biopsy, Osteocool, and kyphoplasty on 06/23/2022.     I am very happy to hear that his pain has completely  resolved.  I shared with him that his biopsy did not show any evidence of metastatic disease.    Thank you for this interesting consult.  I greatly enjoyed meeting George Franco and look forward to participating in their care.  A copy of this report was sent to the requesting provider on this date.  Electronically Signed: Paula Libra Carizma Dunsworth 07/07/2022, 4:37 PM   I spent a total of  15 Minutes in remote  clinical consultation, greater than 50% of which was counseling/coordinating care for Traumatic T6 compression fracture.    Visit type: Audio only (telephone). Audio (no video) only due to technical limitations. Alternative for in-person consultation at [facility], [address] [place], [place], [place]. This visit type was conducted due to national recommendations for restrictions regarding the COVID-19 Pandemic (e.g. social distancing).  This format is felt to be most appropriate for this patient at this time.  All issues noted in this document were discussed and addressed.

## 2022-07-25 ENCOUNTER — Ambulatory Visit: Payer: Medicare HMO | Admitting: Psychology

## 2022-07-29 ENCOUNTER — Other Ambulatory Visit: Payer: Self-pay

## 2022-07-29 ENCOUNTER — Inpatient Hospital Stay: Payer: Medicare HMO | Attending: Hematology and Oncology | Admitting: Hematology and Oncology

## 2022-07-29 ENCOUNTER — Inpatient Hospital Stay: Payer: Medicare HMO

## 2022-07-29 VITALS — BP 144/70 | HR 50 | Temp 97.5°F | Resp 18 | Wt 177.4 lb

## 2022-07-29 DIAGNOSIS — M4854XA Collapsed vertebra, not elsewhere classified, thoracic region, initial encounter for fracture: Secondary | ICD-10-CM | POA: Insufficient documentation

## 2022-07-29 DIAGNOSIS — Z862 Personal history of diseases of the blood and blood-forming organs and certain disorders involving the immune mechanism: Secondary | ICD-10-CM | POA: Insufficient documentation

## 2022-07-29 DIAGNOSIS — Z8546 Personal history of malignant neoplasm of prostate: Secondary | ICD-10-CM | POA: Diagnosis not present

## 2022-07-29 DIAGNOSIS — I10 Essential (primary) hypertension: Secondary | ICD-10-CM | POA: Diagnosis not present

## 2022-07-29 DIAGNOSIS — Z8041 Family history of malignant neoplasm of ovary: Secondary | ICD-10-CM | POA: Diagnosis not present

## 2022-07-29 DIAGNOSIS — E119 Type 2 diabetes mellitus without complications: Secondary | ICD-10-CM | POA: Insufficient documentation

## 2022-07-29 DIAGNOSIS — E785 Hyperlipidemia, unspecified: Secondary | ICD-10-CM | POA: Insufficient documentation

## 2022-07-29 DIAGNOSIS — D472 Monoclonal gammopathy: Secondary | ICD-10-CM | POA: Insufficient documentation

## 2022-07-29 LAB — CBC WITH DIFFERENTIAL (CANCER CENTER ONLY)
Abs Immature Granulocytes: 0.02 10*3/uL (ref 0.00–0.07)
Basophils Absolute: 0 10*3/uL (ref 0.0–0.1)
Basophils Relative: 0 %
Eosinophils Absolute: 0 10*3/uL (ref 0.0–0.5)
Eosinophils Relative: 0 %
HCT: 42.4 % (ref 39.0–52.0)
Hemoglobin: 13.9 g/dL (ref 13.0–17.0)
Immature Granulocytes: 0 %
Lymphocytes Relative: 14 %
Lymphs Abs: 0.9 10*3/uL (ref 0.7–4.0)
MCH: 31.4 pg (ref 26.0–34.0)
MCHC: 32.8 g/dL (ref 30.0–36.0)
MCV: 95.9 fL (ref 80.0–100.0)
Monocytes Absolute: 0.5 10*3/uL (ref 0.1–1.0)
Monocytes Relative: 8 %
Neutro Abs: 4.7 10*3/uL (ref 1.7–7.7)
Neutrophils Relative %: 78 %
Platelet Count: 142 10*3/uL — ABNORMAL LOW (ref 150–400)
RBC: 4.42 MIL/uL (ref 4.22–5.81)
RDW: 13.3 % (ref 11.5–15.5)
WBC Count: 6.1 10*3/uL (ref 4.0–10.5)
nRBC: 0 % (ref 0.0–0.2)

## 2022-07-29 LAB — CMP (CANCER CENTER ONLY)
ALT: 55 U/L — ABNORMAL HIGH (ref 0–44)
AST: 45 U/L — ABNORMAL HIGH (ref 15–41)
Albumin: 4.6 g/dL (ref 3.5–5.0)
Alkaline Phosphatase: 84 U/L (ref 38–126)
Anion gap: 8 (ref 5–15)
BUN: 14 mg/dL (ref 8–23)
CO2: 30 mmol/L (ref 22–32)
Calcium: 10.2 mg/dL (ref 8.9–10.3)
Chloride: 102 mmol/L (ref 98–111)
Creatinine: 0.75 mg/dL (ref 0.61–1.24)
GFR, Estimated: >60 mL/min (ref >60)
Glucose, Bld: 82 mg/dL (ref 70–99)
Potassium: 3.9 mmol/L (ref 3.5–5.1)
Sodium: 140 mmol/L (ref 135–145)
Total Bilirubin: 0.9 mg/dL (ref 0.3–1.2)
Total Protein: 8.3 g/dL — ABNORMAL HIGH (ref 6.5–8.1)

## 2022-07-29 LAB — LACTATE DEHYDROGENASE: LDH: 179 U/L (ref 98–192)

## 2022-07-29 NOTE — Progress Notes (Unsigned)
[place] [phone]   Fax:(336) [place] NOTE  Patient Care Team: [name], MD as PCP - General (Endocrinology)  Hematological/Oncological History # IgG Kappa monoclonal gammopathy  04/15/2022: M protein 0.3, IFE shows IgG Kappa monoclonal protein 07/29/2022: establish care with Dr. Lorenso Courier   CHIEF COMPLAINTS/PURPOSE OF CONSULTATION:  " IgG Kappa monoclonal gammopathy  "  HISTORY OF PRESENTING ILLNESS:  George Franco 66 y.o. male with medical history significant for prostate cancer, stroke, type 2 diabetes, hypogonadism, hyperlipidemia, and hypertension who presents for evaluation of a newly diagnosed monoclonal gammopathy.  On review of the previous records George Franco underwent an SPEP on 04/15/2022 which showed M protein 0.3, with IFE showing IgG kappa monoclonal protein.  Due to concern for these findings the patient was referred to hematology for further evaluation and management.  On exam today George Franco ***  MEDICAL HISTORY:  Past Medical History:  Diagnosis Date   At risk for sleep apnea    STOP-BANG= 5     SENT TO PCP 06-02-2015   BPH (benign prostatic hyperplasia)    CTS (carpal tunnel syndrome)    CTS (carpal tunnel syndrome)    ED (erectile dysfunction) of organic origin    Hyperlipidemia    Hypertension    Hypogonadism in male    Knee pain    pt said he doesn't know anything about this   Lower urinary tract symptoms (LUTS)    LVH (left ventricular hypertrophy)    Persistent vertigo of central origin    Prostate cancer [facility]) urologist-  dr ottelin/  oncologist-  [name]   Stage T1c, Gleason 4+3, PSA 4.48,  vol 32.9cc--  External RXT complete 05-08-2015   Stroke ([place])    Type 2 diabetes mellitus ([place])    Vestibular neuropathy    Wears glasses     SURGICAL HISTORY: Past Surgical History:  Procedure Laterality Date   ANTERIOR CERVICAL DECOMP/DISCECTOMY FUSION N/A 11/02/2021   Procedure: Anterior Cervical  Decompression Fusion, Cervical four-five, Cervical five-six, Cervical six-seven;  Surgeon: Karsten Ro, DO;  Location: [place];  Service: Neurosurgery;  Laterality: N/A;   COLONOSCOPY     IR ANGIOGRAM EXTREMITY RIGHT  03/05/2020   IR BONE TUMOR(S)RF ABLATION  06/23/2022   IR FEM POP ART PTA MOD SED  03/05/2020   IR KYPHO THORACIC WITH BONE BIOPSY  06/23/2022   IR RADIOLOGIST EVAL & MGMT  02/26/2020   IR RADIOLOGIST EVAL & MGMT  04/15/2020   IR RADIOLOGIST EVAL & MGMT  07/22/2020   IR RADIOLOGIST EVAL & MGMT  05/24/2022   IR RADIOLOGIST EVAL & MGMT  07/07/2022   IR TIB-PERO ART ATHEREC INC PTA MOD SED  03/05/2020   IR US GUIDE VASC ACCESS RIGHT  03/05/2020   PROSTATE BIOPSY     RADIOACTIVE SEED IMPLANT N/A 06/05/2015   Procedure: RADIOACTIVE SEED IMPLANT/BRACHYTHERAPY IMPLANT;  Surgeon: [name], MD;  Location: [place];  Service: Urology;  Laterality: N/A;   ROTATOR CUFF REPAIR Right [id number]   UMBILICAL HERNIA REPAIR  1990's   UPPER GASTROINTESTINAL ENDOSCOPY     wisdom teeth extrat      SOCIAL HISTORY: Social History   Socioeconomic History   Marital status: Married    Spouse name: Not on file   Number of children: 3   Years of education: Not on file   Highest education level: Not on file  Occupational History   Occupation: Glass blower/designer    Employer: [employer]  Tobacco Use   Smoking status: Every Day    Packs/day: 0.50    Years: 20.00    Total pack years: 10.00    Types: Cigarettes   Smokeless tobacco: Never  Vaping Use   Vaping Use: Never used  Substance and Sexual Activity   Alcohol use: Yes    Alcohol/week: 2.0 standard drinks of alcohol    Types: 2 Shots of liquor per week   Drug use: No   Sexual activity: Yes    Partners: Female  Other Topics Concern   Not on file  Social History Narrative   Married, 2 sons one daughter   Retired    Caffeine: none    0-1 alcoholic drinks/week   He is a smoker   No drug use   Lives at home with wife    Social Determinants of Radio broadcast assistant Strain: Not on file  Food Insecurity: Not on file  Transportation Needs: Not on file  Physical Activity: Not on file  Stress: Not on file  Social Connections: Not on file  Intimate Partner Violence: Not on file    FAMILY HISTORY: Family History  Problem Relation Age of Onset   Prostate cancer Father    Diabetes Father    Diabetes Mother    Diabetes Brother    Colon cancer Neg Hx    Esophageal cancer Neg Hx    Liver cancer Neg Hx    Pancreatic cancer Neg Hx    Rectal cancer Neg Hx    Stomach cancer Neg Hx     ALLERGIES:  has No Known Allergies.  MEDICATIONS:  Current Outpatient Medications  Medication Sig Dispense Refill   aspirin EC 81 MG tablet Take 1 tablet (81 mg total) by mouth daily. 30 tablet 0   benazepril (LOTENSIN) 40 MG tablet Take 40 mg by mouth daily.     citalopram (CELEXA) 10 MG tablet Take 10 mg by mouth daily.     ezetimibe (ZETIA) 10 MG tablet Take 10 mg by mouth daily.     FARXIGA 10 MG TABS tablet Take 10 mg by mouth daily.     HYDROcodone-acetaminophen (NORCO/VICODIN) 5-325 MG tablet Take 1-2 tablets by mouth every 4 (four) hours as needed for moderate pain. (Patient not taking: Reported on 05/24/2022) 20 tablet 0   Insulin Glargine (BASAGLAR KWIKPEN) 100 UNIT/ML Inject 26 Units into the skin daily.     loperamide (IMODIUM) 2 MG capsule Take 2 mg by mouth every 6 (six) hours as needed for diarrhea or loose stools.  0   meclizine (ANTIVERT) 25 MG tablet Take 1 tablet (25 mg total) by mouth 3 (three) times daily as needed for dizziness. 30 tablet 2   methocarbamol (ROBAXIN) 500 MG tablet Take 1 tablet (500 mg total) by mouth 4 (four) times daily. (Patient not taking: Reported on 05/24/2022) 90 tablet 2   NOVOLOG FLEXPEN 100 UNIT/ML FlexPen Inject 22 Units into the skin 3 (three) times daily with meals.  6   ONETOUCH VERIO test strip USE TO TEST BLOOD SUGAR 3 TIMES A DAY PRIOR TO MEALS AND RECORD      rosuvastatin (CRESTOR) 20 MG tablet Take 20 mg by mouth daily.     No current facility-administered medications for this visit.    REVIEW OF SYSTEMS:   Constitutional: ( - ) fevers, ( - )  chills , ( - ) night sweats Eyes: ( - ) blurriness of vision, ( - ) double vision, ( - )  watery eyes Ears, nose, mouth, throat, and face: ( - ) mucositis, ( - ) sore throat Respiratory: ( - ) cough, ( - ) dyspnea, ( - ) wheezes Cardiovascular: ( - ) palpitation, ( - ) chest discomfort, ( - ) lower extremity swelling Gastrointestinal:  ( - ) nausea, ( - ) heartburn, ( - ) change in bowel habits Skin: ( - ) abnormal skin rashes Lymphatics: ( - ) new lymphadenopathy, ( - ) easy bruising Neurological: ( - ) numbness, ( - ) tingling, ( - ) new weaknesses Behavioral/Psych: ( - ) mood change, ( - ) new changes  All other systems were reviewed with the patient and are negative.  PHYSICAL EXAMINATION:  There were no vitals filed for this visit. There were no vitals filed for this visit.  GENERAL: well appearing *** in NAD  SKIN: skin color, texture, turgor are normal, no rashes or significant lesions EYES: conjunctiva are pink and non-injected, sclera clear LUNGS: clear to auscultation and percussion with normal breathing effort HEART: regular rate & rhythm and no murmurs and no lower extremity edema Musculoskeletal: no cyanosis of digits and no clubbing  PSYCH: alert & oriented x 3, fluent speech NEURO: no focal motor/sensory deficits  LABORATORY DATA:  I have reviewed the data as listed    Latest Ref Rng & Units 05/27/2022    8:16 PM 05/27/2022    8:07 PM 05/24/2022    3:30 PM  CBC  WBC 4.0 - 10.5 K/uL  7.1  8.3   Hemoglobin 13.0 - 17.0 g/dL 13.6  12.1  14.0   Hematocrit 39.0 - 52.0 % 40.0  37.4  41.8   Platelets 150 - 400 K/uL  174  255        Latest Ref Rng & Units 05/27/2022    8:16 PM 05/27/2022    8:07 PM 05/24/2022    3:30 PM  CMP  Glucose 70 - 99 mg/dL 57  64  73   BUN 8 - 23  mg/dL _0 Creatinine 0.61 - 1.24 mg/dL 0.70  0.72  0.74   Sodium 135 - 145 mmol/L 141  139  140   Potassium 3.5 - 5.1 mmol/L 4.1  3.9  4.1   Chloride 98 - 111 mmol/L 104  103  102   CO2 22 - 32 mmol/L  28  26   Calcium 8.9 - 10.3 mg/dL  8.9  10.0   Total Protein 6.1 - 8.1 g/dL   8.0   Total Bilirubin 0.2 - 1.2 mg/dL   1.0   AST 10 - 35 U/L   21   ALT 9 - 46 U/L   16      ASSESSMENT & PLAN ***  After review of the labs, review of the records, and discussion with the patient the patients findings are most consistent with a monoclonal gammopathy.   Monoclonal Gammopathies are a group of medical conditions defined by the presence of a monoclonal protein (an M protein) in the blood or urine. Monoclonal gammopathies include monoclonal gammopathy of unknown significance (MGUS), Monoclonal gammopathies of renal or neurological significance,  smoldering multiple myeloma (SMM), multiple myeloma (MM), AL amyloidosis, and Waldenstrom macroglobulinemia. The goal of the initial workup is to determine which monoclonal gammopathy a patient has. The workup consists of evaluating protein in the serum (with serum protein electrophoresis (SPEP) and serum free light chains) , evaluating protein in the urine (UPEP), and evaluation of the skeleton (DG Bone  Met Survey) to assure no lytic lesions. Baseline bloodwork includes CMP and CBC. If no CRAB criteria or high risk criteria are noted then the diagnosis is MGUS. MGUS must be followed with bloodwork periodically to assure it does not convert to multiple myeloma (occurs to approximately 1% of patients per year). If there are CRAB criteria or high risk features (such as elevated serum free light chain ratio (taking into account renal function), a non IgG M protein, or M protein >1.5) then a bone marrow biopsy must be pursued.    #IgG Kappa Monoclonal Gammopathy of Undetermined Significance --today will order an SPEP, UPEP, SFLC and beta 2  microglobulin --additionally will collect new baseline CBC, CMP, and LDH --recommend a metastatic bone survey to assess for lytic lesions --will consider the need for a bone marrow biopsy pending the above results   No orders of the defined types were placed in this encounter.   All questions were answered. The patient knows to call the clinic with any problems, questions or concerns.  A total of more than 60 minutes were spent on this encounter with face-to-face time and non-face-to-face time, including preparing to see the patient, ordering tests and/or medications, counseling the patient and coordination of care as outlined above.   Ledell Peoples, MD Department of Hematology/Oncology [place] at [facility] Phone: [phone] Pager: [phone] Email: [email or web address]  07/29/2022 1:21 PM

## 2022-07-30 LAB — BETA 2 MICROGLOBULIN, SERUM: Beta-2 Microglobulin: 1.8 mg/L (ref 0.6–2.4)

## 2022-08-02 LAB — KAPPA/LAMBDA LIGHT CHAINS
Kappa free light chain: 39 mg/L — ABNORMAL HIGH (ref 3.3–19.4)
Kappa, lambda light chain ratio: 2.95 — ABNORMAL HIGH (ref 0.26–1.65)
Lambda free light chains: 13.2 mg/L (ref 5.7–26.3)

## 2022-08-03 LAB — MULTIPLE MYELOMA PANEL, SERUM
Albumin SerPl Elph-Mcnc: 4.1 g/dL (ref 2.9–4.4)
Albumin/Glob SerPl: 1.3 (ref 0.7–1.7)
Alpha 1: 0.3 g/dL (ref 0.0–0.4)
Alpha2 Glob SerPl Elph-Mcnc: 0.9 g/dL (ref 0.4–1.0)
B-Globulin SerPl Elph-Mcnc: 1 g/dL (ref 0.7–1.3)
Gamma Glob SerPl Elph-Mcnc: 1.2 g/dL (ref 0.4–1.8)
Globulin, Total: 3.4 g/dL (ref 2.2–3.9)
IgA: 418 mg/dL (ref 61–437)
IgG (Immunoglobin G), Serum: 1027 mg/dL (ref 603–1613)
IgM (Immunoglobulin M), Srm: 45 mg/dL (ref 20–172)
M Protein SerPl Elph-Mcnc: 0.3 g/dL — ABNORMAL HIGH
Total Protein ELP: 7.5 g/dL (ref 6.0–8.5)

## 2022-08-04 ENCOUNTER — Other Ambulatory Visit: Payer: Self-pay | Admitting: Hematology and Oncology

## 2022-08-04 DIAGNOSIS — D472 Monoclonal gammopathy: Secondary | ICD-10-CM

## 2022-08-12 ENCOUNTER — Ambulatory Visit (HOSPITAL_COMMUNITY): Payer: Medicare HMO

## 2022-08-26 ENCOUNTER — Ambulatory Visit (HOSPITAL_COMMUNITY)
Admission: RE | Admit: 2022-08-26 | Discharge: 2022-08-26 | Disposition: A | Discharge status: Home / Self Care | Payer: Medicare HMO | Source: Ambulatory Visit | Attending: Hematology and Oncology | Admitting: Hematology and Oncology

## 2022-08-26 DIAGNOSIS — D472 Monoclonal gammopathy: Secondary | ICD-10-CM | POA: Diagnosis not present

## 2022-08-31 DIAGNOSIS — F01C0 Vascular dementia, severe, without behavioral disturbance, psychotic disturbance, mood disturbance, and anxiety: Secondary | ICD-10-CM | POA: Diagnosis not present

## 2022-08-31 DIAGNOSIS — D472 Monoclonal gammopathy: Secondary | ICD-10-CM | POA: Diagnosis not present

## 2022-08-31 DIAGNOSIS — G319 Degenerative disease of nervous system, unspecified: Secondary | ICD-10-CM | POA: Diagnosis not present

## 2022-08-31 DIAGNOSIS — L109 Pemphigus, unspecified: Secondary | ICD-10-CM | POA: Diagnosis not present

## 2022-08-31 DIAGNOSIS — E1159 Type 2 diabetes mellitus with other circulatory complications: Secondary | ICD-10-CM | POA: Diagnosis not present

## 2022-08-31 DIAGNOSIS — Z8673 Personal history of transient ischemic attack (TIA), and cerebral infarction without residual deficits: Secondary | ICD-10-CM | POA: Diagnosis not present

## 2022-08-31 DIAGNOSIS — Z8546 Personal history of malignant neoplasm of prostate: Secondary | ICD-10-CM | POA: Diagnosis not present

## 2022-08-31 DIAGNOSIS — F331 Major depressive disorder, recurrent, moderate: Secondary | ICD-10-CM | POA: Diagnosis not present

## 2022-08-31 DIAGNOSIS — I700 Atherosclerosis of aorta: Secondary | ICD-10-CM | POA: Diagnosis not present

## 2022-08-31 DIAGNOSIS — E785 Hyperlipidemia, unspecified: Secondary | ICD-10-CM | POA: Diagnosis not present

## 2022-09-01 ENCOUNTER — Other Ambulatory Visit: Payer: Self-pay | Admitting: Internal Medicine

## 2022-09-01 ENCOUNTER — Telehealth: Payer: Self-pay | Admitting: Adult Health

## 2022-09-01 DIAGNOSIS — D472 Monoclonal gammopathy: Secondary | ICD-10-CM

## 2022-09-01 NOTE — Telephone Encounter (Signed)
LVM asked wife to call back to schedule a f/u appointment with Sheliah Hatch

## 2022-09-01 NOTE — H&P (Signed)
Chief Complaint: Patient was seen in consultation today for monoclonal gammopathy at the request of Dorsey,John T IV  Referring Physician(s): Dorsey,John T IV  Supervising Physician: Markus Daft  Patient Status: [facility] - Out-pt  History of Present Illness: George Franco is a 67 y.o. male with PMH significant for prostate cancer, HTN, LVH, DM II and CVA. Pt is well known to IR service having  RLE angiogram with angioplasty for occluded peroneal artery 03/05/20 and T6 ablation, biopsy and kyphoplasty 06/23/2022. Pt is followed by oncology after biopsy was positive for IgG Kappa monoclonal protein. He has been referred to IR for bone marrow biopsy.   Pt denies chills, fever, emesis, SOB, CP or dizziness.  He endorses loss of appetite, fatigue, nausea, and weakness.  He is NPO per order.    Past Medical History:  Diagnosis Date   At risk for sleep apnea    STOP-BANG= 5     SENT TO PCP 06-02-2015   BPH (benign prostatic hyperplasia)    CTS (carpal tunnel syndrome)    CTS (carpal tunnel syndrome)    ED (erectile dysfunction) of organic origin    Hyperlipidemia    Hypertension    Hypogonadism in male    Knee pain    pt said he doesn't know anything about this   Lower urinary tract symptoms (LUTS)    LVH (left ventricular hypertrophy)    Persistent vertigo of central origin    Prostate cancer [facility]) urologist-  dr ottelin/  oncologist-  [name]   Stage T1c, Gleason 4+3, PSA 4.48,  vol 32.9cc--  External RXT complete 05-08-2015   Stroke ([place])    Type 2 diabetes mellitus ([place])    Vestibular neuropathy    Wears glasses     Past Surgical History:  Procedure Laterality Date   ANTERIOR CERVICAL DECOMP/DISCECTOMY FUSION N/A 11/02/2021   Procedure: Anterior Cervical Decompression Fusion, Cervical four-five, Cervical five-six, Cervical six-seven;  Surgeon: Dawley, Theodoro Doing, DO;  Location: [place];  Service: Neurosurgery;  Laterality: N/A;   COLONOSCOPY     IR ANGIOGRAM EXTREMITY RIGHT   03/05/2020   IR BONE TUMOR(S)RF ABLATION  06/23/2022   IR FEM POP ART PTA MOD SED  03/05/2020   IR KYPHO THORACIC WITH BONE BIOPSY  06/23/2022   IR RADIOLOGIST EVAL & MGMT  02/26/2020   IR RADIOLOGIST EVAL & MGMT  04/15/2020   IR RADIOLOGIST EVAL & MGMT  07/22/2020   IR RADIOLOGIST EVAL & MGMT  05/24/2022   IR RADIOLOGIST EVAL & MGMT  07/07/2022   IR TIB-PERO ART ATHEREC INC PTA MOD SED  03/05/2020   IR US GUIDE VASC ACCESS RIGHT  03/05/2020   PROSTATE BIOPSY     RADIOACTIVE SEED IMPLANT N/A 06/05/2015   Procedure: RADIOACTIVE SEED IMPLANT/BRACHYTHERAPY IMPLANT;  Surgeon: [name], MD;  Location: [place];  Service: Urology;  Laterality: N/A;   ROTATOR CUFF REPAIR Right [id number]   UMBILICAL HERNIA REPAIR  1990's   UPPER GASTROINTESTINAL ENDOSCOPY     wisdom teeth extrat      Allergies: Patient has no known allergies.  Medications: Prior to Admission medications   Medication Sig Start Date End Date Taking? Authorizing Provider  aspirin EC 81 MG tablet Take 1 tablet (81 mg total) by mouth daily. 06/05/19   Melvenia Beam, MD  benazepril (LOTENSIN) 40 MG tablet Take 40 mg by mouth daily.    [provider]  ezetimibe (ZETIA) 10 MG tablet Take 10 mg by mouth daily.  07/13/19   [provider]  FARXIGA 10 MG TABS tablet Take 10 mg by mouth daily. 12/29/17   [provider]  loperamide (IMODIUM) 2 MG capsule Take 2 mg by mouth every 6 (six) hours as needed for diarrhea or loose stools. 01/08/18   [provider]  meclizine (ANTIVERT) 25 MG tablet Take 1 tablet (25 mg total) by mouth 3 (three) times daily as needed for dizziness. Patient not taking: Reported on 07/29/2022 12/30/20   Melvenia Beam, MD  methocarbamol (ROBAXIN) 500 MG tablet Take 1 tablet (500 mg total) by mouth 4 (four) times daily. 11/04/21   Marvis Moeller, NP  NOVOLOG FLEXPEN 100 UNIT/ML FlexPen Inject 22 Units into the skin 3 (three) times daily with meals. 12/12/17    [provider]  ONETOUCH VERIO test strip USE TO TEST BLOOD SUGAR 3 TIMES A DAY PRIOR TO MEALS AND RECORD 03/22/19   [provider]     Family History  Problem Relation Age of Onset   Prostate cancer Father    Diabetes Father    Diabetes Mother    Diabetes Brother    Colon cancer Neg Hx    Esophageal cancer Neg Hx    Liver cancer Neg Hx    Pancreatic cancer Neg Hx    Rectal cancer Neg Hx    Stomach cancer Neg Hx     Social History   Socioeconomic History   Marital status: Married    Spouse name: Not on file   Number of children: 3   Years of education: Not on file   Highest education level: Not on file  Occupational History   Occupation: Glass blower/designer    Employer: [employer]  Tobacco Use   Smoking status: Every Day    Packs/day: 0.50    Years: 20.00    Total pack years: 10.00    Types: Cigarettes   Smokeless tobacco: Never  Vaping Use   Vaping Use: Never used  Substance and Sexual Activity   Alcohol use: Yes    Alcohol/week: 2.0 standard drinks of alcohol    Types: 2 Shots of liquor per week   Drug use: No   Sexual activity: Yes    Partners: Female  Other Topics Concern   Not on file  Social History Narrative   Married, 2 sons one daughter   Retired    Caffeine: none    0-1 alcoholic drinks/week   He is a smoker   No drug use   Lives at home with wife   Social Determinants of Health   Financial Resource Strain: Not on file  Food Insecurity: Not on file  Transportation Needs: Not on file  Physical Activity: Not on file  Stress: Not on file  Social Connections: Not on file    Review of Systems: A 12 point ROS discussed and pertinent positives are indicated in the HPI above.  All other systems are negative.  Review of Systems  Constitutional:  Positive for appetite change and fatigue. Negative for chills and fever.  Respiratory:  Negative for shortness of breath.   Cardiovascular:  Negative for leg swelling.   Gastrointestinal:  Positive for nausea. Negative for abdominal pain and vomiting.  Neurological:  Positive for weakness. Negative for dizziness and headaches.    Vital Signs: 154/86, HR 90, RR 20, 98.5, 95% RA    Physical Exam Vitals reviewed.  Constitutional:      General: He is not in acute distress.  Appearance: Normal appearance. He is ill-appearing.  HENT:     Head: Normocephalic and atraumatic.     Mouth/Throat:     Mouth: Mucous membranes are dry.     Pharynx: Oropharynx is clear.  Eyes:     Extraocular Movements: Extraocular movements intact.     Pupils: Pupils are equal, round, and reactive to light.  Cardiovascular:     Rate and Rhythm: Normal rate and regular rhythm.     Pulses: Normal pulses.     Heart sounds: Normal heart sounds.  Pulmonary:     Effort: Pulmonary effort is normal. No respiratory distress.     Breath sounds: Normal breath sounds.  Abdominal:     General: Bowel sounds are normal. There is no distension.     Palpations: Abdomen is soft.     Tenderness: There is no abdominal tenderness. There is no guarding.  Musculoskeletal:     Right lower leg: No edema.     Left lower leg: No edema.  Skin:    General: Skin is warm and dry.  Neurological:     Mental Status: He is alert.  Psychiatric:        Mood and Affect: Mood normal.        Thought Content: Thought content normal.        Judgment: Judgment normal.     Imaging: DG Bone Survey Met  Result Date: 08/28/2022 CLINICAL DATA:  History of MGUS, assess for lytic lesions. History of prostate cancer. EXAM: METASTATIC BONE SURVEY COMPARISON:  CT abdomen/pelvis 01/17/2018 and MRI thoracic spine 04/21/2022 FINDINGS: Examination demonstrates no focal lytic lesions. There is a focal well-defined 1 cm sclerotic lesion over the right iliac bone unchanged. Radiation seed implants over the prostate. Anterior fusion hardware over the cervical spine from C4-C7. Degenerative changes of the spine and  appendicular skeleton. Stable moderate compression fracture over the midthoracic spine post kyphoplasty. IMPRESSION: 1. No focal lytic lesions. 2. Stable 1 cm sclerotic lesion over the right iliac bone likely a bone island. 3. Stable moderate compression fracture over the midthoracic spine post kyphoplasty. Electronically Signed   By: Marin Olp M.D.   On: 08/28/2022 14:00    Labs:  CBC: Recent Labs    05/24/22 1530 05/27/22 2007 05/27/22 2016 07/29/22 1419 09/02/22 0857  WBC 8.3 7.1  --  6.1 5.6  HGB 14.0 12.1* 13.6 13.9 12.7*  HCT 41.8 37.4* 40.0 42.4 38.2*  PLT 255 174  --  142* 211    COAGS: No results for input(s): "INR", "APTT" in the last 8760 hours.  BMP: Recent Labs    10/26/21 1450 05/24/22 1530 05/27/22 2007 05/27/22 2016 07/29/22 1419  NA 142 140 139 141 140  K 3.3* 4.1 3.9 4.1 3.9  CL 103 102 103 104 102  CO2 '29 26 28  '$ --  30  GLUCOSE 60* 73 64* 57* 82  BUN 5* '9 13 13 14  '$ CALCIUM 9.3 10.0 8.9  --  10.2  CREATININE 0.84 0.74 0.72 0.70 0.75  GFRNONAA >60  --  >60  --  >60    LIVER FUNCTION TESTS: Recent Labs    05/24/22 1530 07/29/22 1419  BILITOT 1.0 0.9  AST 21 45*  ALT 16 55*  ALKPHOS  --  84  PROT 8.0 8.3*  ALBUMIN  --  4.6    TUMOR MARKERS: No results for input(s): "AFPTM", "CEA", "CA199", "CHROMGRNA" in the last 8760 hours.  Assessment and Plan: 67 yo male presents to IR  for bone marrow biopsy after newly diagnosed monoclonal gammopathy.   Pt resting on stretcher with wife at bedside.  He is A&O but lethargic.  He is in no distress.  Today's labs pending.   Risks and benefits of bone marrow biopsy and aspiration with moderate sedation was discussed with the patient and/or patient's family including, but not limited to bleeding, infection, damage to adjacent structures or low yield requiring additional tests.  All of the questions were answered and there is agreement to proceed.  Consent signed and in chart.  Thank you for this  interesting consult.  I greatly enjoyed meeting Onyekachi Gathright and look forward to participating in their care.  A copy of this report was sent to the requesting provider on this date.  Electronically Signed: Tyson Alias, NP 09/02/2022, 9:48 AM   I spent a total of 20 minutes in face to face in clinical consultation, greater than 50% of which was counseling/coordinating care for monoclonal gammopathy.

## 2022-09-01 NOTE — Telephone Encounter (Signed)
Spoke to wife made appointment for pt 10/2022 with Sheliah Hatch and placed on wait list for next available

## 2022-09-01 NOTE — Telephone Encounter (Signed)
Yes, okay to schedule with me for cognitive f/u concerns. Thank you.

## 2022-09-01 NOTE — Telephone Encounter (Signed)
Pt's wife, Shandon [place] (on [place]) Have completed all the referrals ordered since last office visit. 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 Due to prioritizing care for him and myself we have not been able to schedule appt with [facility].  Would like a call from the nurse to discuss an appt with Dr. Jaynee Eagles.  Informed pt

## 2022-09-02 ENCOUNTER — Other Ambulatory Visit: Payer: Self-pay | Admitting: Radiology

## 2022-09-02 ENCOUNTER — Encounter (HOSPITAL_COMMUNITY): Payer: Self-pay

## 2022-09-02 ENCOUNTER — Ambulatory Visit (HOSPITAL_COMMUNITY)
Admission: RE | Admit: 2022-09-02 | Discharge: 2022-09-02 | Disposition: A | Discharge status: Home / Self Care | Payer: Medicare HMO | Source: Ambulatory Visit | Attending: Hematology and Oncology | Admitting: Hematology and Oncology

## 2022-09-02 DIAGNOSIS — E119 Type 2 diabetes mellitus without complications: Secondary | ICD-10-CM | POA: Insufficient documentation

## 2022-09-02 DIAGNOSIS — Z8673 Personal history of transient ischemic attack (TIA), and cerebral infarction without residual deficits: Secondary | ICD-10-CM | POA: Diagnosis not present

## 2022-09-02 DIAGNOSIS — F17210 Nicotine dependence, cigarettes, uncomplicated: Secondary | ICD-10-CM | POA: Diagnosis not present

## 2022-09-02 DIAGNOSIS — Z1379 Encounter for other screening for genetic and chromosomal anomalies: Secondary | ICD-10-CM | POA: Insufficient documentation

## 2022-09-02 DIAGNOSIS — I10 Essential (primary) hypertension: Secondary | ICD-10-CM | POA: Diagnosis not present

## 2022-09-02 DIAGNOSIS — D472 Monoclonal gammopathy: Secondary | ICD-10-CM | POA: Diagnosis not present

## 2022-09-02 DIAGNOSIS — Z8546 Personal history of malignant neoplasm of prostate: Secondary | ICD-10-CM | POA: Insufficient documentation

## 2022-09-02 DIAGNOSIS — D6489 Other specified anemias: Secondary | ICD-10-CM | POA: Diagnosis not present

## 2022-09-02 HISTORY — PX: IR BONE MARROW BIOPSY & ASPIRATION: IMG5727

## 2022-09-02 LAB — CBC WITH DIFFERENTIAL/PLATELET
Abs Immature Granulocytes: 0.01 10*3/uL (ref 0.00–0.07)
Basophils Absolute: 0 10*3/uL (ref 0.0–0.1)
Basophils Relative: 1 %
Eosinophils Absolute: 0.1 10*3/uL (ref 0.0–0.5)
Eosinophils Relative: 1 %
HCT: 38.2 % — ABNORMAL LOW (ref 39.0–52.0)
Hemoglobin: 12.7 g/dL — ABNORMAL LOW (ref 13.0–17.0)
Immature Granulocytes: 0 %
Lymphocytes Relative: 23 %
Lymphs Abs: 1.3 10*3/uL (ref 0.7–4.0)
MCH: 31.2 pg (ref 26.0–34.0)
MCHC: 33.2 g/dL (ref 30.0–36.0)
MCV: 93.9 fL (ref 80.0–100.0)
Monocytes Absolute: 0.5 10*3/uL (ref 0.1–1.0)
Monocytes Relative: 10 %
Neutro Abs: 3.6 10*3/uL (ref 1.7–7.7)
Neutrophils Relative %: 65 %
Platelets: 211 10*3/uL (ref 150–400)
RBC: 4.07 MIL/uL — ABNORMAL LOW (ref 4.22–5.81)
RDW: 14.1 % (ref 11.5–15.5)
WBC: 5.6 10*3/uL (ref 4.0–10.5)
nRBC: 0 % (ref 0.0–0.2)

## 2022-09-02 LAB — GLUCOSE, CAPILLARY: Glucose-Capillary: 174 mg/dL — ABNORMAL HIGH (ref 70–99)

## 2022-09-02 MED ORDER — LIDOCAINE HCL (PF) 1 % IJ SOLN
INTRAMUSCULAR | Status: AC
  Administered 2022-09-02: 15 mL via INTRADERMAL
  Filled 2022-09-02: qty 30

## 2022-09-02 MED ORDER — MIDAZOLAM HCL 2 MG/2ML IJ SOLN
INTRAMUSCULAR | Status: AC
  Filled 2022-09-02: qty 2

## 2022-09-02 MED ORDER — MIDAZOLAM HCL 2 MG/2ML IJ SOLN
INTRAMUSCULAR | Status: AC | PRN
  Administered 2022-09-02 (×4): 1 mg via INTRAVENOUS

## 2022-09-02 MED ORDER — FENTANYL CITRATE (PF) 100 MCG/2ML IJ SOLN
INTRAMUSCULAR | Status: AC | PRN
  Administered 2022-09-02 (×2): 50 ug via INTRAVENOUS

## 2022-09-02 MED ORDER — FENTANYL CITRATE (PF) 100 MCG/2ML IJ SOLN
INTRAMUSCULAR | Status: AC
  Filled 2022-09-02: qty 2

## 2022-09-02 MED ORDER — SODIUM CHLORIDE 0.9 % IV SOLN: INTRAVENOUS | Status: DC

## 2022-09-02 NOTE — Procedures (Signed)
Interventional Radiology Procedure:   Indications:  IgG Kappa Monoclonal Gammopathy    Procedure: Fluoroscopic guided bone marrow biopsy  Findings: 2 aspirates and 1 core from right ilium  Complications: None     EBL: Minimal, less than 10 ml  Plan: Discharge to home in one hour.   Ariea Rochin R. Anselm Pancoast, MD  Pager: [phone]

## 2022-09-06 LAB — SURGICAL PATHOLOGY

## 2022-09-08 NOTE — Progress Notes (Deleted)
[id number]    Provider:  Dr Jaynee Eagles Requesting Provider: [name], MD Primary Care Provider:  [name], MD    CC: Memory loss  HPI:  Update 09/12/2022 JM: Patient returns for follow-up visit with concerns of memory loss.    Completed neurocognitive evaluation 06/03/2022 with Dr. Sima Matas which essentially showed all cognitive domains significantly and severely impaired however some impairments likely exacerbated and worsened by vision and hearing deficits and difficulties with executive functioning.  This met diagnostic criteria for major neurocognitive disorder due to cerebrovascular factors, testing was not consistent with progressive cortical neurological condition such as Alzheimer's, Lewy body or other cortical dementia nor show patterns consistent with other conditions such as Parkinson's or subcortical dementia.  He also has other conditions which could be contributing including significant pain conditions, vertigo and dizziness, sustained sleep disturbance and peripheral neuropathy secondary to DM 2, also concern of possible underlying sleep apnea and recommended this further be evaluated.   He was previously seen over 1 year ago for imbalance and vertigo/dizziness.  MRI brain showed likely chronic lacunar infarctions and chronic microvascular ischemic changes. MR cervical show potential for nerve root compression at C4-5, C5-C6 and C6-C7.  He was referred to neurosurgery for further evaluation and underwent ACDF at these levels.   He is currently being followed by oncology for newly diagnosed MGUS     History provided for reference purposes only Update 07/06/2021 JM: Returns for 30-monthfollow-up unaccompanied  C/o imbalance - continued use of cane at all times. He did have a recent fall (tripped over a drop cord) thankfully without injury. No other recent falls. More so feels unsteady - is not worse specifically on uneven ground.  At first  stated not new issue but then reported it was new - once asked for how long, he stated since the last time he was here 6 months ago. Denies any reoccurring dizziness or vertigo but then reported he will have dizziness with quick head movements which is not new. He has been increasing water intake.   Previously seen in office for chronic persistent worsening vertigo/dizziness by Dr. AJaynee Eagles- he has not yet completed recommend MR brain and cervical spine.  He was seen by ENT 02/01/2021 who recommended vestibular therapy for his continued disequilibrium which is likely microvascular issue.  Previously participated in vestibular therapy for only 1 session due to financial reasons - he did not do any additional therapy sessions since prior visit.  Update 12/30/2020 Dr. AJaynee Eagles patient here for dizziness, was referred to vestibular PT but only went once and declined any further appointments despite recommendations that this may help. He is back for follow up. He says vertigo has been terrible. It never stops. Feelings of motion and dizziness when standing or walking. If he looks up he gets dizzy, turn head either way he feels dizzy, feels like motion when there is no motion, happens even when sitting.  Not recently to ENT, decreased hearing in the right ear. He has imbalance with walking as well.   Update 09/27/2020 JM [name] being seen for acute visit. He was previously seen by Dr. AJaynee Eaglesin 05/2019 for vertigo type episodes and stable since that time but reports 2-week onset of dizziness greatly interfering with ambulation. Evaluated by PCP who prescribed meclizine and advised to follow-up with our office for further evaluation.  Reports dizziness has been constant present upon awakening and worsening throughout the day.  He also reports worsening when turning head quickly  from side to side or when he stands from sitting for prolonged period.  Denies symptoms while sitting.  He has been using a cane which he was  using previously and denies any recent falls.  Use of meclizine without benefit.  He will occasionally have difficulty focusing with both eyes open but denies blurred or double vision.  He does admit to having a prescription for glasses but lost them and has not had recent follow-up with ophthalmology.  Denies weakness, numbness/tingling, speech or language difficulties or headache.  Towards the end of visit, he also reports diminished appetite and minimal to no fluid intake throughout the day  Recent lab work by PCP personally reviewed which was all satisfactory  Update 06/05/2019 Dr. Jaynee Eagles: today patient comes in for follow up, multiple lacunar strokes in the right pons, discussed lacunar strokes, causes, he smells heavily of cigarette smoke and we discussed smoking as a big risk factor, cholesterol, diabetes. We reviewed all the images togather, showed him the microvascular chronic changes. He needs close follow up and strict management of vascular risk factors. Will complete stroke workup with CTA head and echocardiogram.   Initial visit 05/13/2019 Dr. Jaynee Eagles:  George Franco is a 67 y.o. male here as requested by George Bowen, MD for vertigo.  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 Vertigo started several years ago, it is episodic, it hits him "all of a sudden", he feels dizzy and feels like he is going to fall. He is going to have another MRI 05/28/2019 ordered by Dr. Forde Dandy. Vertigo lasts all day, if he turn to the left the vertigo worsens, he says the episodes come and go and he started having the symptoms sine 8am. If he turns his head he can make the symptoms worse. He was shown the Epley Maneuvers by ENT. Never been to vestibular therapy. No vomiting. No weakness or vision changes  or any other focal deficit. Sitting still helps the vertigo. No inciting events, he has several episodes a year lasting 1-2 days. He sometimes has headaches, frontal, dull not migrainous. Lack of sleep worsens. Melatonin helps him sleep. No other focal neurologic deficits, associated symptoms, inciting events or modifiable factors.  Reviewed notes, labs and imaging from outside physicians, which showed: 03/25/2019: Ct w/wo showed No acute intracranial abnormalities including mass lesion or mass effect, hydrocephalus, extra-axial fluid collection, midline shift, hemorrhage, or acute infarction, large ischemic events (personally reviewed images)  Reviewed MRI brain report 11/2014: FINDINGS: Major intracranial vascular flow voids are stable. Cerebral volume is normal. No restricted diffusion to suggest acute infarction. No midline shift, mass effect, evidence of mass lesion, ventriculomegaly, extra-axial collection or acute intracranial hemorrhage. Cervicomedullary junction and pituitary are within normal limits. Negative visualized cervical spine.  Cbc normal, cmp with elevated glucose otherwise unremarkable   Pearline Cables and white matter signal throughout the brain appears within normal limits for age and not changed since 2007. No cortical encephalomalacia or chronic blood products identified.   Diminutive appearance of both IAC which is chronic. Other visible internal auditory structures appear grossly normal in the mastoids are clear.   Negative paranasal sinuses. Visualized orbit soft tissues are within normal limits. Normal bone marrow signal. Visualized scalp soft tissues are within normal limits.   IMPRESSION: 1. Stable and essentially normal for age noncontrast MRI appearance of  the brain. 2. Chronic diminutive appearance of the bony internal auditory canals. Is there any evidence of vestibulocochlear neuropathy, or any other cranial neuropathies to suggest entrapment such as due  to hyperostosis crani  alis interna?  I reviewed Dr. Baldwin Crown notes.  Patient presented for short-term disability and restrictions form to be filled out.  Dizziness had improved "a little bit" at last appointment.  He had been offered neurology referral in the past but declined but at the last appointment wanted to see neurology.  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  No vision changes.  I reviewed examination which showed slightly elevated blood pressure at 155/74, otherwise physical exam was normal, he did complain of vertigo, dizziness and gait instability.   Review of Systems: Patient complains of symptoms per HPI as well as the following symptoms: Imbalance. Pertinent negatives and positives per HPI. All others negative    Social History   Socioeconomic History   Marital status: Married    Spouse name: Not on file   Number of children: 3   Years of education: Not on file   Highest education level: Not on file  Occupational History   Occupation: Glass blower/designer    Employer: [employer]  Tobacco Use   Smoking status: Every Day    Packs/day: 0.50    Years: 20.00    Total pack years: 10.00    Types: Cigarettes   Smokeless tobacco: Never  Vaping Use   Vaping Use: Never used  Substance and Sexual Activity   Alcohol use: Yes    Alcohol/week: 2.0 standard drinks of alcohol    Types: 2 Shots of liquor per week   Drug use: No   Sexual activity: Yes    Partners: Female  Other Topics Concern   Not on file  Social History Narrative   Married, 2 sons one daughter   Retired    Caffeine: none    0-1 alcoholic drinks/week   He is a smoker   No drug use   Lives at home with wife   Social Determinants of Radio broadcast assistant Strain: Not on file  Food Insecurity: Not on file   Transportation Needs: Not on file  Physical Activity: Not on file  Stress: Not on file  Social Connections: Not on file  Intimate Partner Violence: Not on file    Family History  Problem Relation Age of Onset   Prostate cancer Father    Diabetes Father    Diabetes Mother    Diabetes Brother    Colon cancer Neg Hx    Esophageal cancer Neg Hx    Liver cancer Neg Hx    Pancreatic cancer Neg Hx    Rectal cancer Neg Hx    Stomach cancer Neg Hx     Past Medical History:  Diagnosis Date   At risk for sleep apnea    STOP-BANG= 5     SENT TO PCP 06-02-2015   BPH (benign prostatic hyperplasia)    CTS (carpal tunnel syndrome)    CTS (carpal tunnel syndrome)    ED (erectile dysfunction) of organic origin    Hyperlipidemia    Hypertension    Hypogonadism in male    Knee pain    pt said he doesn't know anything about this  Lower urinary tract symptoms (LUTS)    LVH (left ventricular hypertrophy)    Persistent vertigo of central origin    Prostate cancer [facility]) urologist-  dr ottelin/  oncologist-  [name]   Stage T1c, Gleason 4+3, PSA 4.48,  vol 32.9cc--  External RXT complete 05-08-2015   Stroke [facility])    Type 2 diabetes mellitus ([place])    Vestibular neuropathy    Wears glasses     Patient Active Problem List   Diagnosis Date Noted   Cervical myelopathy ([place]) 11/02/2021   Asymmetric SNHL (sensorineural hearing loss) 02/01/2021   Disequilibrium 02/01/2021   Lacunar stroke ([place]) 06/05/2019   BPPV (benign paroxysmal positional vertigo) 05/13/2019   Abnormal CT scan of lung - RLL ground glass density 01/26/2018   Malignant neoplasm of prostate ([place]) 01/26/2015   Dizziness and giddiness 11/18/2014   Essential hypertension, benign 11/18/2014    Past Surgical History:  Procedure Laterality Date   ANTERIOR CERVICAL DECOMP/DISCECTOMY FUSION N/A 11/02/2021   Procedure: Anterior Cervical Decompression Fusion, Cervical four-five, Cervical five-six, Cervical six-seven;  Surgeon:  Karsten Ro, DO;  Location: [place];  Service: Neurosurgery;  Laterality: N/A;   COLONOSCOPY     IR ANGIOGRAM EXTREMITY RIGHT  03/05/2020   IR BONE MARROW BIOPSY & ASPIRATION  09/02/2022   IR BONE TUMOR(S)RF ABLATION  06/23/2022   IR FEM POP ART PTA MOD SED  03/05/2020   IR KYPHO THORACIC WITH BONE BIOPSY  06/23/2022   IR RADIOLOGIST EVAL & MGMT  02/26/2020   IR RADIOLOGIST EVAL & MGMT  04/15/2020   IR RADIOLOGIST EVAL & MGMT  07/22/2020   IR RADIOLOGIST EVAL & MGMT  05/24/2022   IR RADIOLOGIST EVAL & MGMT  07/07/2022   IR TIB-PERO ART ATHEREC INC PTA MOD SED  03/05/2020   IR US GUIDE VASC ACCESS RIGHT  03/05/2020   PROSTATE BIOPSY     RADIOACTIVE SEED IMPLANT N/A 06/05/2015   Procedure: RADIOACTIVE SEED IMPLANT/BRACHYTHERAPY IMPLANT;  Surgeon: [name], MD;  Location: [place];  Service: Urology;  Laterality: N/A;   ROTATOR CUFF REPAIR Right [id number]   UMBILICAL HERNIA REPAIR  1990's   UPPER GASTROINTESTINAL ENDOSCOPY     wisdom teeth extrat      Current Outpatient Medications  Medication Sig Dispense Refill   aspirin EC 81 MG tablet Take 1 tablet (81 mg total) by mouth daily. 30 tablet 0   benazepril (LOTENSIN) 40 MG tablet Take 40 mg by mouth daily.     ezetimibe (ZETIA) 10 MG tablet Take 10 mg by mouth daily.     FARXIGA 10 MG TABS tablet Take 10 mg by mouth daily.     loperamide (IMODIUM) 2 MG capsule Take 2 mg by mouth every 6 (six) hours as needed for diarrhea or loose stools.  0   meclizine (ANTIVERT) 25 MG tablet Take 1 tablet (25 mg total) by mouth 3 (three) times daily as needed for dizziness. 30 tablet 2   methocarbamol (ROBAXIN) 500 MG tablet Take 1 tablet (500 mg total) by mouth 4 (four) times daily. 90 tablet 2   NOVOLOG FLEXPEN 100 UNIT/ML FlexPen Inject 22 Units into the skin 3 (three) times daily with meals.  6   ONETOUCH VERIO test strip USE TO TEST BLOOD SUGAR 3 TIMES A DAY PRIOR TO MEALS AND RECORD     No current facility-administered medications for  this visit.    Allergies as of 09/12/2022   (No Known Allergies)    Vitals:  There were no vitals filed for this visit.   There is no height or weight on file to calculate BMI.   Exam: NAD, pleasant                  Speech:    Speech is normal; fluent and spontaneous with normal comprehension.  Cognition:    The patient is oriented to person, place, and time;     recent and remote memory intact;     language fluent;    Cranial Nerves:    The pupils are equal, round, and reactive to light.Smooth pursuit, no nystagmus. Trigeminal sensation is intact and the muscles of mastication are normal. The face is symmetric. The palate elevates in the midline. Hearing intact. Voice is normal. Shoulder shrug is normal. The tongue has normal motion without fasciculations.   Coordination:  No dysmetria  Motor Observation:    No asymmetry, no atrophy, and no involuntary movements noted. Tone:    Normal muscle tone.     Strength:    Strength is symmetrical in the upper and lower limbs.       Gait: wide based gait with mild unsteadiness, mildly ataxic, worse when making turns. Use of cane.  Unable to complete Romberg (reports closing eyes made him dizzy)    Assessment/Plan:  67 year old with chronic persistent worsening dizziness/vertigo.  Seen by [place] previously multiple times back through 2020(carotid dopplers 06/2019 normal) with imaging showing evidence of multiple pontine infarcts on imaging likely incidental in setting of small vessel disease. Exam is non focal. Today, complains of imbalance present over the past 6 months but per prior hx, c/o imbalance chronically.   -advised him to complete prior testing as advised by Dr Jaynee Eagles including MR brain w/wo contrast and MR cervical spine - orders renewed  -referral placed to vestibular rehab -discussed importance of this rehab and if there are financial concerns to speak with the that office for payment plans -Advised use of cane for fall  prevention - per Dr. Jaynee Eagles, if no other etiology then would defer to PCP to consider sending to the [facility] vestibular clinic and we will return patient back to pcp for dizziness  -Throughout prior follow-ups, some evidence of short term memory loss especially noted with history taking. This was not specifically discussed in detail during visit due to time constraints. No specific worsening, just still persistent. Known hx of multiple prior strokes likely contributing factor. Already planning on repeat MRI brain w/wo contrast. Referral placed to neuropsychology for full neurocognitive evaluation. Can continue to follow with PCP for ongoing monitoring -continue to follow with PCP for aggressive stroke risk factor management      CC:  George Bowen, MD   I spent 26 minutes of face-to-face and non-face-to-face time with patient.  This included previsit chart review, lab review, study review, order entry, electronic health record documentation, patient education and discussion regarding chronic dizziness/vertigo with imbalance and further evaluation, importance of vestibular rehab, and answered multiple other questions to patient's satisfaction  [name], [facility]  [facility] [address] [place], [place] [address]  Phone [phone] Fax [phone] Note: This document was prepared with digital dictation and possible smart phrase technology. Any transcriptional errors that result from this process are unintentional.

## 2022-09-12 ENCOUNTER — Ambulatory Visit: Payer: Medicare HMO | Admitting: Adult Health

## 2022-09-12 ENCOUNTER — Encounter (HOSPITAL_COMMUNITY): Payer: Self-pay | Admitting: Hematology and Oncology

## 2022-09-12 ENCOUNTER — Telehealth: Payer: Self-pay

## 2022-09-12 NOTE — Telephone Encounter (Signed)
Called patient's wife to relay/discuss the following message from Dr. Lorenso Courier:  "Please let George Franco know that I have reviewed his labs, results of his bone marrow biopsy, and x-ray.  At this time his findings are consistent with MGUS. He does not have clear signs of multiple myeloma. MGUS is a benign condition that has the potential to transform into a more serious condition. The recommendation is to monitor his blood levels of the abnormal protein 1-2 times per year. I would recommend we see him back in July 2024 to re-evaluate.   We do still require his 24 hour urine sample. Please assure that he has the orange jug and completes this testing"  Mrs. Stavola understand the importance of bringing 24 hour urin back to [facility] lab.

## 2022-09-12 NOTE — Telephone Encounter (Signed)
Pt called requesting results from bone marrow bx No future appointments scheduled at this time.  Routed to Dr. Lorenso Courier

## 2022-09-21 ENCOUNTER — Encounter: Payer: Self-pay | Admitting: Adult Health

## 2022-09-21 ENCOUNTER — Telehealth: Payer: Self-pay | Admitting: Adult Health

## 2022-09-21 NOTE — Telephone Encounter (Signed)
Unable to LVM, no VM box, sent letter in mail informing pt of need to reschedule 10/24/22 appointment - NP out

## 2022-09-28 ENCOUNTER — Ambulatory Visit: Payer: Medicare HMO | Admitting: Podiatry

## 2022-10-03 ENCOUNTER — Encounter: Payer: Self-pay | Admitting: Podiatry

## 2022-10-03 ENCOUNTER — Ambulatory Visit: Payer: Medicare HMO | Admitting: Podiatry

## 2022-10-03 VITALS — BP 162/95

## 2022-10-03 DIAGNOSIS — M2041 Other hammer toe(s) (acquired), right foot: Secondary | ICD-10-CM | POA: Diagnosis not present

## 2022-10-03 DIAGNOSIS — M2012 Hallux valgus (acquired), left foot: Secondary | ICD-10-CM

## 2022-10-03 DIAGNOSIS — E1142 Type 2 diabetes mellitus with diabetic polyneuropathy: Secondary | ICD-10-CM

## 2022-10-03 DIAGNOSIS — Z8673 Personal history of transient ischemic attack (TIA), and cerebral infarction without residual deficits: Secondary | ICD-10-CM | POA: Insufficient documentation

## 2022-10-03 DIAGNOSIS — Z8546 Personal history of malignant neoplasm of prostate: Secondary | ICD-10-CM | POA: Insufficient documentation

## 2022-10-03 DIAGNOSIS — M899 Disorder of bone, unspecified: Secondary | ICD-10-CM | POA: Insufficient documentation

## 2022-10-03 DIAGNOSIS — M5000 Cervical disc disorder with myelopathy, unspecified cervical region: Secondary | ICD-10-CM | POA: Insufficient documentation

## 2022-10-03 DIAGNOSIS — G319 Degenerative disease of nervous system, unspecified: Secondary | ICD-10-CM | POA: Insufficient documentation

## 2022-10-03 DIAGNOSIS — F331 Major depressive disorder, recurrent, moderate: Secondary | ICD-10-CM | POA: Insufficient documentation

## 2022-10-03 DIAGNOSIS — M2042 Other hammer toe(s) (acquired), left foot: Secondary | ICD-10-CM | POA: Diagnosis not present

## 2022-10-03 DIAGNOSIS — M546 Pain in thoracic spine: Secondary | ICD-10-CM | POA: Insufficient documentation

## 2022-10-03 DIAGNOSIS — M79674 Pain in right toe(s): Secondary | ICD-10-CM

## 2022-10-03 DIAGNOSIS — E119 Type 2 diabetes mellitus without complications: Secondary | ICD-10-CM

## 2022-10-03 DIAGNOSIS — M79675 Pain in left toe(s): Secondary | ICD-10-CM | POA: Diagnosis not present

## 2022-10-03 DIAGNOSIS — B351 Tinea unguium: Secondary | ICD-10-CM | POA: Diagnosis not present

## 2022-10-03 DIAGNOSIS — M2011 Hallux valgus (acquired), right foot: Secondary | ICD-10-CM | POA: Diagnosis not present

## 2022-10-03 NOTE — Progress Notes (Unsigned)
ANNUAL DIABETIC FOOT EXAM  Subjective: George Franco (67 years old) {jgcomplaint:23593}.  Chief Complaint  Patient presents with   Nail Problem    DFC BS-110 A1C-6.7 Roma Schanz PCP VST-08/2022    Patient confirms h/o diabetes.  Patient relates {Numbers; 0-100:15068} year h/o diabetes.  Patient denies any h/o foot wounds.  Patient has h/o foot ulcer of {jgPodToeLocator:23637}, which healed via help of ***.  Patient has h/o amputation(s): {jgamp:23617}.  Patient endorses symptoms of foot numbness.   Patient endorses symptoms of foot tingling.  Patient endorses symptoms of burning in feet.  Patient endorses symptoms of pins/needles sensation in feet.  Patient denies any numbness, tingling, burning, or pins/needle sensation in feet.  Patient has been diagnosed with neuropathy and it is managed with {JGNEUROPATHYMEDS:27053}.  Risk factors: {jgriskfactors:24044}.  [name], MD is patient's PCP. Last visit was {Time; dates multiple:15870}***.  Past Medical History:  Diagnosis Date   At risk for sleep apnea    STOP-BANG= 5     SENT TO PCP 06-02-2015   BPH (benign prostatic hyperplasia)    CTS (carpal tunnel syndrome)    CTS (carpal tunnel syndrome)    ED (erectile dysfunction) of organic origin    Hyperlipidemia    Hypertension    Hypogonadism in male    Knee pain    pt said he doesn't know anything about this   Lower urinary tract symptoms (LUTS)    LVH (left ventricular hypertrophy)    Persistent vertigo of central origin    Prostate cancer [facility]) urologist-  dr ottelin/  oncologist-  [name]   Stage T1c, Gleason 4+3, PSA 4.48,  vol 32.9cc--  External RXT complete 05-08-2015   Stroke ([place])    Type 2 diabetes mellitus ([place])    Vestibular neuropathy    Wears glasses    Patient Active Problem List   Diagnosis Date Noted   Acquired cerebral atrophy ([place]) 10/03/2022   Acute bilateral thoracic back pain 10/03/2022   History of prostate cancer  10/03/2022   History of stroke 10/03/2022   Intervertebral disc disorder of cervical region with myelopathy 10/03/2022   Lesion of bone of thoracic spine 10/03/2022   Major depressive disorder, recurrent, moderate ([place]) 10/03/2022   Cervical myelopathy ([place]) 11/02/2021   Asymmetric SNHL (sensorineural hearing loss) 02/01/2021   Disequilibrium 02/01/2021   Hardening of the aorta (main artery of the heart) ([place]) 07/12/2019   Lacunar stroke ([place]) 06/05/2019   BPPV (benign paroxysmal positional vertigo) 05/13/2019   Chronic kidney disease due to hypertension 03/29/2019   Abnormal CT scan of lung - RLL ground glass density 01/26/2018   Long term (current) use of insulin ([place]) 10/30/2017   Malignant neoplasm of prostate ([place]) 01/26/2015   Dizziness and giddiness 11/18/2014   Essential hypertension, benign 11/18/2014   Hyperlipidemia 08/24/2009   Past Surgical History:  Procedure Laterality Date   ANTERIOR CERVICAL DECOMP/DISCECTOMY FUSION N/A 11/02/2021   Procedure: Anterior Cervical Decompression Fusion, Cervical four-five, Cervical five-six, Cervical six-seven;  Surgeon: Karsten Ro, DO;  Location: [place];  Service: Neurosurgery;  Laterality: N/A;   COLONOSCOPY     IR ANGIOGRAM EXTREMITY RIGHT  03/05/2020   IR BONE MARROW BIOPSY & ASPIRATION  09/02/2022   IR BONE TUMOR(S)RF ABLATION  06/23/2022   IR FEM POP ART PTA MOD SED  03/05/2020   IR KYPHO THORACIC WITH BONE BIOPSY  06/23/2022   IR RADIOLOGIST EVAL & MGMT  02/26/2020   IR RADIOLOGIST EVAL & MGMT  04/15/2020   IR RADIOLOGIST  EVAL & MGMT  07/22/2020   IR RADIOLOGIST EVAL & MGMT  05/24/2022   IR RADIOLOGIST EVAL & MGMT  07/07/2022   IR TIB-PERO ART ATHEREC INC PTA MOD SED  03/05/2020   IR US GUIDE VASC ACCESS RIGHT  03/05/2020   PROSTATE BIOPSY     RADIOACTIVE SEED IMPLANT N/A 06/05/2015   Procedure: RADIOACTIVE SEED IMPLANT/BRACHYTHERAPY IMPLANT;  Surgeon: [name], MD;  Location: [place];  Service: Urology;   Laterality: N/A;   ROTATOR CUFF REPAIR Right [id number]   UMBILICAL HERNIA REPAIR  1990's   UPPER GASTROINTESTINAL ENDOSCOPY     wisdom teeth extrat     Current Outpatient Medications on File Prior to Visit  Medication Sig Dispense Refill   aspirin EC 81 MG tablet Take 1 tablet (81 mg total) by mouth daily. 30 tablet 0   benazepril (LOTENSIN) 40 MG tablet Take 40 mg by mouth daily.     ezetimibe (ZETIA) 10 MG tablet Take 10 mg by mouth daily.     FARXIGA 10 MG TABS tablet Take 10 mg by mouth daily.     loperamide (IMODIUM) 2 MG capsule Take 2 mg by mouth every 6 (six) hours as needed for diarrhea or loose stools.  0   meclizine (ANTIVERT) 25 MG tablet Take 1 tablet (25 mg total) by mouth 3 (three) times daily as needed for dizziness. 30 tablet 2   methocarbamol (ROBAXIN) 500 MG tablet Take 1 tablet (500 mg total) by mouth 4 (four) times daily. 90 tablet 2   NOVOLOG FLEXPEN 100 UNIT/ML FlexPen Inject 22 Units into the skin 3 (three) times daily with meals.  6   ONETOUCH VERIO test strip USE TO TEST BLOOD SUGAR 3 TIMES A DAY PRIOR TO MEALS AND RECORD     No current facility-administered medications on file prior to visit.    No Known Allergies Social History   Occupational History   Occupation: IT sales professional: [employer]  Tobacco Use   Smoking status: Every Day    Packs/day: 0.50    Years: 20.00    Total pack years: 10.00    Types: Cigarettes   Smokeless tobacco: Never  Vaping Use   Vaping Use: Never used  Substance and Sexual Activity   Alcohol use: Yes    Alcohol/week: 2.0 standard drinks of alcohol    Types: 2 Shots of liquor per week   Drug use: No   Sexual activity: Yes    Partners: Female   Family History  Problem Relation Age of Onset   Prostate cancer Father    Diabetes Father    Diabetes Mother    Diabetes Brother    Colon cancer Neg Hx    Esophageal cancer Neg Hx    Liver cancer Neg Hx    Pancreatic cancer Neg Hx    Rectal cancer Neg Hx     Stomach cancer Neg Hx    Immunization History  Administered Date(s) Administered   PFIZER(Purple Top)SARS-COV-2 Vaccination 04/07/2020   Td 02/05/2016     Review of Systems: Negative except as noted in the HPI.   Objective: Vitals:   10/03/22 1452  BP: (!) 162/95   Quamari Wetsel is a pleasant 67 y.o. male in NAD. AAO X 3.  Vascular Examination: {jgvascular:23595}  Dermatological Examination: {jgderm:23598}  Neurological Examination: {jgneuro:23601::"Protective sensation intact 5/5 intact bilaterally with 10g monofilament b/l.","Vibratory sensation intact b/l.","Proprioception intact bilaterally."}  Musculoskeletal Examination: {jgmsk:23600}  Footwear Assessment: Does the patient wear appropriate  shoes? {Yes,No}. Does the patient need inserts/orthotics? {Yes,No}.  Lab Results  Component Value Date   HGBA1C 6.5 (H) 10/26/2021   No results found. ADA Risk Categorization: Low Risk :  Patient has all of the following: Intact protective sensation No prior foot ulcer  No severe deformity Pedal pulses present  High Risk  Patient has one or more of the following: Loss of protective sensation Absent pedal pulses Severe Foot deformity History of foot ulcer  Assessment: No diagnosis found.   Plan: No orders of the defined types were placed in this encounter.   No orders of the defined types were placed in this encounter.   None  {jgplan:23602::"-Patient/POA to call should there be question/concern in the interim."} Return in about 3 months (around 01/01/2023).  Marzetta Board, DPM

## 2022-10-05 DIAGNOSIS — H40013 Open angle with borderline findings, low risk, bilateral: Secondary | ICD-10-CM | POA: Diagnosis not present

## 2022-10-05 DIAGNOSIS — H25813 Combined forms of age-related cataract, bilateral: Secondary | ICD-10-CM | POA: Diagnosis not present

## 2022-10-05 DIAGNOSIS — H353124 Nonexudative age-related macular degeneration, left eye, advanced atrophic with subfoveal involvement: Secondary | ICD-10-CM | POA: Diagnosis not present

## 2022-10-05 DIAGNOSIS — H524 Presbyopia: Secondary | ICD-10-CM | POA: Diagnosis not present

## 2022-10-05 DIAGNOSIS — E119 Type 2 diabetes mellitus without complications: Secondary | ICD-10-CM | POA: Diagnosis not present

## 2022-10-05 DIAGNOSIS — H353112 Nonexudative age-related macular degeneration, right eye, intermediate dry stage: Secondary | ICD-10-CM | POA: Diagnosis not present

## 2022-10-10 ENCOUNTER — Ambulatory Visit: Payer: Medicare HMO | Admitting: Adult Health

## 2022-10-20 DIAGNOSIS — I129 Hypertensive chronic kidney disease with stage 1 through stage 4 chronic kidney disease, or unspecified chronic kidney disease: Secondary | ICD-10-CM | POA: Diagnosis not present

## 2022-10-20 DIAGNOSIS — E785 Hyperlipidemia, unspecified: Secondary | ICD-10-CM | POA: Diagnosis not present

## 2022-10-20 DIAGNOSIS — N182 Chronic kidney disease, stage 2 (mild): Secondary | ICD-10-CM | POA: Diagnosis not present

## 2022-10-20 DIAGNOSIS — E1159 Type 2 diabetes mellitus with other circulatory complications: Secondary | ICD-10-CM | POA: Diagnosis not present

## 2022-10-20 DIAGNOSIS — Z794 Long term (current) use of insulin: Secondary | ICD-10-CM | POA: Diagnosis not present

## 2022-10-24 ENCOUNTER — Ambulatory Visit: Payer: Medicare HMO | Admitting: Adult Health

## 2022-10-26 ENCOUNTER — Encounter: Payer: Self-pay | Admitting: Adult Health

## 2022-10-26 ENCOUNTER — Telehealth: Payer: Self-pay | Admitting: Adult Health

## 2022-10-26 NOTE — Telephone Encounter (Signed)
LVM and sent letter in mail to inform pt of need to reschedule 11/08/22 appointment - NP out

## 2022-11-08 ENCOUNTER — Ambulatory Visit: Payer: Medicare HMO | Admitting: Adult Health

## 2023-01-12 ENCOUNTER — Ambulatory Visit: Payer: Medicare HMO | Admitting: Neurology

## 2023-01-12 ENCOUNTER — Encounter: Payer: Self-pay | Admitting: Neurology

## 2023-01-12 VITALS — BP 138/66 | HR 66 | Ht 69.0 in | Wt 169.2 lb

## 2023-01-12 DIAGNOSIS — F03A0 Unspecified dementia, mild, without behavioral disturbance, psychotic disturbance, mood disturbance, and anxiety: Secondary | ICD-10-CM | POA: Diagnosis not present

## 2023-01-12 DIAGNOSIS — I639 Cerebral infarction, unspecified: Secondary | ICD-10-CM

## 2023-01-12 DIAGNOSIS — R9082 White matter disease, unspecified: Secondary | ICD-10-CM | POA: Diagnosis not present

## 2023-01-12 DIAGNOSIS — R4189 Other symptoms and signs involving cognitive functions and awareness: Secondary | ICD-10-CM

## 2023-01-12 DIAGNOSIS — R269 Unspecified abnormalities of gait and mobility: Secondary | ICD-10-CM

## 2023-01-12 DIAGNOSIS — M48061 Spinal stenosis, lumbar region without neurogenic claudication: Secondary | ICD-10-CM | POA: Diagnosis not present

## 2023-01-12 DIAGNOSIS — R292 Abnormal reflex: Secondary | ICD-10-CM

## 2023-01-12 DIAGNOSIS — R270 Ataxia, unspecified: Secondary | ICD-10-CM

## 2023-01-12 MED ORDER — DONEPEZIL HCL 10 MG PO TABS: ORAL_TABLET | ORAL | 3 refills | Status: DC

## 2023-01-12 NOTE — Progress Notes (Addendum)
[facility]    Provider:  Dr Lucia Gaskins Requesting Provider: Adrian Prince, MD Primary Care Provider:  Adrian Prince, MD    CC: cognitive decline  01/12/2023:  George Franco is a 67 y.o. male here as requested by Adrian Prince, MD. 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 Last seen 06/26/2021.   We have seen patient for multiple symptoms including vertical, cervical stenosis, strokes, imbalance. Had surgery for multiple cervical stenosis  wife provides most information. Here today for cognitive decline. He still feels dizzy. He doesn't have a good appetite, he is drinking fluids, we have done most everything we can do, sent to vestibular therapy, he has diabetic polyneuropathy, prior cervical stenosis sequelae, multiple strokes, mRI with white matter changes which can also cause cognitive changes as well as gait problems, Stable, no falls. Get up and has dizziness, also when turning head, constant dizziness. He saw Dr. Kieth Brightly and has likely vascular dementia and he suggests sleep test. No back pain. No neck pain.   Dr. Marvetta Gibbons notes:  I suspect that the primary etiological factor creating progressive changes in cognitive functioning are related to significant  microvascular ischemic changes and potential small lacunar infarctions.  This combined with significant pain conditions, dizziness and vertigo, sustained sleep disturbance, and peripheral neuropathy secondary to type 2 diabetes that is insulin-dependent are also having a contributing factor.  Other issues of concern of the possibility of an underlying obstructive sleep apnea.    I would recommend that the patient have assessment for possible obstructive sleep apnea.  While the level of cognitive deficits could not be explained by this factor alone it is something that given his issues with hypertension, indications of microvascular ischemic changes/small vessel disease, memory and cognitive deficits would suggest that this assessment would be warranted with the patient having multiple risk factors for obstructive sleep apnea.  I will sit down with the patient and his wife and go over the results of the current neuropsychological evaluation.   Wife is concerned for his memory. MMSE 22/30. He has been diagnosed with vascular dementia by formal memory testing. He also has chronic low back pain which may ne affecting his mbalance an no one has ever MRIed his lower back.     01/12/2023    2:04 PM  MMSE - Mini Mental State Exam  Orientation to time 3  Orientation to Place 5  Registration 3  Attention/ Calculation 1  Recall 1  Language- name 2 objects 2  Language- repeat 1  Language- follow 3 step command 3  Language- read & follow direction 1  Write a sentence 1  Copy design 1  Total score 22   Patient complains of symptoms per HPI as well as the following symptoms: cognitive decline . Pertinent negatives and positives per HPI. All others negative   Reviewed notes, labs and imaging from outside physicians, which showed:  MRI of the cervical spine has multi-level moderate  and severe stenosis with foraminal stenosis with compresive myelopathy. I think he needs evaluation for cervical  decompression.At one level he has compressive myelopathy. I'd like to send him to Dr. Jake Samples and/or Dr. Blima Singer. Including them on this correspindence in case they feel differently but I think he needs neurosurgical evaluation and surgery. Please discuss with patent and I will place referral to our wonderful neurosurgical colleagues, thanks  As far as his brain,w e can address at a later time, this needs to be first see below   IMPRESSION: This MRI of the cervical spine without contrast shows the following: 1.   Increased T2 signal within the right hemicord adjacent to C5 and C6-C7, consistent with compressive myelopathic change. 2.   At C3-C4, there is mild spinal stenosis and moderate left foraminal narrowing though there does not appear to be nerve root compression. 3.   At C4-C5, there is moderate spinal stenosis and moderate to moderately severe bilateral foraminal narrowing with some potential for C5 nerve root compression to either side. 4.   At C5-C6, there is moderately severe spinal stenosis and moderately severe bilateral foraminal narrowing with potential for C6 nerve root compression to either side. 5.   At C6-C7, there is moderately severe spinal stenosis, more to the right, and moderately severe left foraminal narrowing with potential for left C7 nerve root compression.  Narrative & Impression    [facility] [address] [place], [place] [address] [phone]   NEUROIMAGING REPORT     STUDY DATE: 08/02/2021   PATIENT NAME: George Franco DOB: [birth year] MRN: [id number]   EXAM: MRI Brain/IAC's with and without contrast   ORDERING CLINICIAN: Ihor Austin, NP CLINICAL HISTORY: 67 year old man with dizziness and imbalance COMPARISON FILMS: MRI 05/28/2019   TECHNIQUE: MRI of the brain with and without contrast was obtained utilizing 5 mm axial slices with T1, T2, T2 flair, susceptibility weighted and diffusion weighted views.  T1 sagittal, T2  coronal and postcontrast views in the axial and coronal plane were obtained.  Additional thin section axial CISS and axial and coronal pre and postcontrast T1-weighted images through the internal auditory canals were performed. CONTRAST: 19 ml MultiHance IMAGING SITE: [facility], [address] [place], [place], [place]   FINDINGS: On sagittal images, the spinal cord is imaged caudally to C2-C3 and is normal in caliber.   The contents of the posterior fossa are of normal size and position.   The pituitary gland and optic chiasm appear normal.    Brain volume appears normal for age.  Mild cerebellar atrophy is noted, similar to the 2020 MRI.Marland Kitchen   The ventricles are normal in size and without distortion.  There are no abnormal extra-axial collections of fluid.     There are multiple T2/FLAIR hyperintense foci in the hemispheres with confluencies noted in the frontal lobes.  None of the foci enhanced or appear to be acute.  Multiple foci are noted within the pons and the right middle cerebellar peduncle.  Diffusion weighted images are normal.  Some foci of the hemispheres and pons have an appearance consistent with chronic lacunar infarctions.  None of the foci appear to be acute.  They do not enhance.  Compared to the MRI from 05/28/2019, more foci are noted within the pons as well as in the hemispheres.  Susceptibility weighted images show a single chronic microhemorrhage in the right occipital lobe, not present on the previous MRI.     The VIIth/VIIIth nerve complex appears normal.  The mastoid  air cells appear normal.  The orbits appear normal.   Mild mucoperiosteal thickening is noted in the maxillary sinuses.  The other paranasal sinuses appear normal.  Flow voids are identified within the major intracerebral arteries.      After the infusion of contrast material, a normal enhancement pattern is noted.     IMPRESSION: This MRI of the brain with and without contrast with added attention to the internal  auditory canal shows the following: 1.  Multiple T2/FLAIR hyperintense foci in the hemispheres and pons.  Many of the foci are nonspecific.  However, some of the foci have appearance consistent with chronic lacunar infarctions and other foci show an appearance consistent with chronic microvascular ischemic change.  Demyelination is not ruled out for some of the foci.  The extent has progressed compared to the 2020 MRI. 2.  Single chronic microhemorrhage in the right occipital lobe was not present on the 2020 MRI.   3.   Internal auditory canals appear normal. 4.   No acute findings.  Normal enhancement pattern.    Reviewed his MRi cervical spine: Patient'ss MRI of the cervical spine has multi-level moderate and severe stenosis with foraminal stenosis with compresive myelopathy. I think he needs evaluation for cervical decompression.At one level he has compressive myelopathy. I'd like to send him to Dr. Jake Samples and/or Dr. Blima Singer. Including them on this correspindence in case they feel differently but I think he needs neurosurgical evaluation and surgery. Please discuss with patent and I will place referral to our wonderful neurosurgical colleagues, thanks  As far as his brain,w e can address at a later time, this needs to be first see below   IMPRESSION: This MRI of the cervical spine without contrast shows the following: 1.   Increased T2 signal within the right hemicord adjacent to C5 and C6-C7, consistent with compressive myelopathic change. 2.   At C3-C4, there is mild spinal stenosis and moderate left foraminal narrowing though there does not appear to be nerve root compression. 3.   At C4-C5, there is moderate spinal stenosis and moderate to moderately severe bilateral foraminal narrowing with some potential for C5 nerve root compression to either side. 4.   At C5-C6, there is moderately severe spinal stenosis and moderately severe bilateral foraminal narrowing with potential for C6 nerve root  compression to either side. 5.   At C6-C7, there is moderately severe spinal stenosis, more to the right, and moderately severe left foraminal narrowing with potential for left C7 nerve root compression.  Patient complains of symptoms per HPI as well as the following symptoms: decline . Pertinent negatives and positives per HPI. All others negative  Update 07/06/2021 JM: Returns for 42-month follow-up unaccompanied   C/o imbalance - continued use of cane at all times. He did have a recent fall (tripped over a drop cord) thankfully without injury. No other recent falls. More so feels unsteady - is not worse specifically on uneven ground.  At first stated not new issue but then reported it was new - once asked for how long, he stated since the last time he was here 6 months ago. Denies any reoccurring dizziness or vertigo but then reported he will have dizziness with quick head movements which is not new. He has been increasing water intake.    Previously seen in office for chronic persistent worsening vertigo/dizziness by Dr. Lucia Gaskins - he has not yet completed recommend MR brain and cervical spine.  He was seen by ENT 02/01/2021 who recommended  vestibular therapy for his continued disequilibrium which is likely microvascular issue.  Previously participated in vestibular therapy for only 1 session due to financial reasons - he did not do any additional therapy sessions since prior visit.   12/30/2020: patient here for dizziness, was referred to vestibular PT but only went once and declined any further appointments despite recommendations that this may help. He is back for follow up. He says vertigo has been terrible. It never stops. Feelings of motion and dizziness when standing or walking. If he looks up he gets dizzy, turn head either way he feels dizzy, feels like motion when there is no motion, happens even when sitting.  Not recently to ENT, decreased hearing in the right ear. He has imbalance with  walking as well.   Today, 09/30/2020, George Franco is being seen for acute visit. He was previously seen by Dr. Lucia Gaskins in 05/2019 for vertigo type episodes and stable since that time but reports 2-week onset of dizziness greatly interfering with ambulation. Evaluated by PCP who prescribed meclizine and advised to follow-up with our office for further evaluation.  Reports dizziness has been constant present upon awakening and worsening throughout the day.  He also reports worsening when turning head quickly from side to side or when he stands from sitting for prolonged period.  Denies symptoms while sitting.  He has been using a cane which he was using previously and denies any recent falls.  Use of meclizine without benefit.  He will occasionally have difficulty focusing with both eyes open but denies blurred or double vision.  He does admit to having a prescription for glasses but lost them and has not had recent follow-up with ophthalmology.  Denies weakness, numbness/tingling, speech or language difficulties or headache.  Towards the end of visit, he also reports diminished appetite and minimal to no fluid intake throughout the day  Recent lab work by PCP personally reviewed which was all satisfactory    History provided for reference purposes only Update 06/05/2019 Dr. Lucia Gaskins: today patient comes in for follow up, multiple lacunar strokes in the right pons, discussed lacunar strokes, causes, he smells heavily of cigarette smoke and we discussed smoking as a big risk factor, cholesterol, diabetes. We reviewed all the images togather, showed him the microvascular chronic changes. He needs close follow up and strict management of vascular risk factors. Will complete stroke workup with CTA head and echocardiogram.   Initial visit 05/13/2019 Dr. Lucia Gaskins:  George Franco is a 67 y.o. male here as requested by Adrian Prince, MD for vertigo.  Past medical history hypertension, hyperlipidemia, LVH, BPH, knee pain,  CTS, hypergonadism, prostate cancer 2016 stage T1c adenocarcinoma of the prostate with a Gleason score 4+3 and a PSA of 4.48, vestibular neuropathy per MRI April 2016, persistent vertigo. Vertigo started several years ago, it is episodic, it hits him "all of a sudden", he feels dizzy and feels like he is going to fall. He is going to have another MRI 05/28/2019 ordered by Dr. Evlyn Kanner. Vertigo lasts all day, if he turn to the left the vertigo worsens, he says the episodes come and go and he started having the symptoms sine 8am. If he turns his head he can make the symptoms worse. He was shown the Epley Maneuvers by ENT. Never been to vestibular therapy. No vomiting. No weakness or vision changes or any other focal deficit. Sitting still helps the vertigo. No inciting events, he has several episodes a year lasting 1-2 days. He sometimes has  headaches, frontal, dull not migrainous. Lack of sleep worsens. Melatonin helps him sleep. No other focal neurologic deficits, associated symptoms, inciting events or modifiable factors.  Reviewed notes, labs and imaging from outside physicians, which showed: 03/25/2019: Ct w/wo showed No acute intracranial abnormalities including mass lesion or mass effect, hydrocephalus, extra-axial fluid collection, midline shift, hemorrhage, or acute infarction, large ischemic events (personally reviewed images)  Reviewed MRI brain report 11/2014: FINDINGS: Major intracranial vascular flow voids are stable. Cerebral volume is normal. No restricted diffusion to suggest acute infarction. No midline shift, mass effect, evidence of mass lesion, ventriculomegaly, extra-axial collection or acute intracranial hemorrhage. Cervicomedullary junction and pituitary are within normal limits. Negative visualized cervical spine.  Cbc normal, cmp with elevated glucose otherwise unremarkable   Wallace Cullens and white matter signal throughout the brain appears within normal limits for age and not changed  since 2007. No cortical encephalomalacia or chronic blood products identified.   Diminutive appearance of both IAC which is chronic. Other visible internal auditory structures appear grossly normal in the mastoids are clear.   Negative paranasal sinuses. Visualized orbit soft tissues are within normal limits. Normal bone marrow signal. Visualized scalp soft tissues are within normal limits.   IMPRESSION: 1. Stable and essentially normal for age noncontrast MRI appearance of the brain. 2. Chronic diminutive appearance of the bony internal auditory canals. Is there any evidence of vestibulocochlear neuropathy, or any other cranial neuropathies to suggest entrapment such as due to hyperostosis crani  alis interna?  I reviewed Dr. Rinaldo Cloud notes.  Patient presented for short-term disability and restrictions form to be filled out.  Dizziness had improved "a little bit" at last appointment.  He had been offered neurology referral in the past but declined but at the last appointment wanted to see neurology.  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  No vision changes.  I reviewed examination which showed slightly elevated blood pressure at 155/74, otherwise physical exam was normal, he did complain of vertigo, dizziness and gait instability.   Review of Systems: Patient complains of symptoms per HPI as well as the following symptoms: dizziness . Pertinent negatives and positives per HPI. All others negative    Social History   Socioeconomic History   Marital status: Married    Spouse name: Not on file   Number of children: 3   Years of education: Not on file   Highest education level: Not on file  Occupational History   Occupation: Location manager    Employer: [employer]  Tobacco Use   Smoking  status: Every Day    Current packs/day: 0.50    Average packs/day: 0.5 packs/day for 20.0 years (10.0 ttl pk-yrs)    Types: Cigarettes   Smokeless tobacco: Never  Vaping Use   Vaping status: Never Used  Substance and Sexual Activity   Alcohol use: Not Currently    Alcohol/week: 2.0 standard drinks of alcohol    Types: 2 Shots of liquor per week   Drug use: No   Sexual activity: Yes    Partners: Female  Other Topics Concern   Not on file  Social History Narrative   Married, 2 sons one daughter   Retired    Caffeine: none    0-1 alcoholic drinks/week   He is a smoker  No drug use   Lives at home with wife   Social Determinants of Health   Financial Resource Strain: Not on file  Food Insecurity: Not on file  Transportation Needs: Not on file  Physical Activity: Not on file  Stress: Not on file  Social Connections: Not on file  Intimate Partner Violence: Not on file    Family History  Problem Relation Age of Onset   Prostate cancer Father    Diabetes Father    Diabetes Mother    Diabetes Brother    Colon cancer Neg Hx    Esophageal cancer Neg Hx    Liver cancer Neg Hx    Pancreatic cancer Neg Hx    Rectal cancer Neg Hx    Stomach cancer Neg Hx     Past Medical History:  Diagnosis Date   At risk for sleep apnea    STOP-BANG= 5     SENT TO PCP 06-02-2015   BPH (benign prostatic hyperplasia)    CTS (carpal tunnel syndrome)    CTS (carpal tunnel syndrome)    ED (erectile dysfunction) of organic origin    Hyperlipidemia    Hypertension    Hypogonadism in male    Knee pain    pt said he doesn't know anything about this   Lower urinary tract symptoms (LUTS)    LVH (left ventricular hypertrophy)    Persistent vertigo of central origin    Prostate cancer [facility]) urologist-  dr ottelin/  oncologist-  dr Kathrynn Running   Stage T1c, Gleason 4+3, PSA 4.48,  vol 32.9cc--  External RXT complete 05-08-2015   Stroke (HCC)    Type 2 diabetes mellitus (HCC)    Vestibular  neuropathy    Wears glasses     Patient Active Problem List   Diagnosis Date Noted   Acquired cerebral atrophy (HCC) 10/03/2022   Acute bilateral thoracic back pain 10/03/2022   History of prostate cancer 10/03/2022   History of stroke 10/03/2022   Intervertebral disc disorder of cervical region with myelopathy 10/03/2022   Lesion of bone of thoracic spine 10/03/2022   Major depressive disorder, recurrent, moderate (HCC) 10/03/2022   Cervical myelopathy (HCC) 11/02/2021   Asymmetric SNHL (sensorineural hearing loss) 02/01/2021   Disequilibrium 02/01/2021   Hardening of the aorta (main artery of the heart) (HCC) 07/12/2019   Lacunar stroke (HCC) 06/05/2019   BPPV (benign paroxysmal positional vertigo) 05/13/2019   Chronic kidney disease due to hypertension 03/29/2019   Abnormal CT scan of lung - RLL ground glass density 01/26/2018   Long term (current) use of insulin (HCC) 10/30/2017   Malignant neoplasm of prostate (HCC) 01/26/2015   Dizziness and giddiness 11/18/2014   Essential hypertension, benign 11/18/2014   Hyperlipidemia 08/24/2009    Past Surgical History:  Procedure Laterality Date   ANTERIOR CERVICAL DECOMP/DISCECTOMY FUSION N/A 11/02/2021   Procedure: Anterior Cervical Decompression Fusion, Cervical four-five, Cervical five-six, Cervical six-seven;  Surgeon: Bethann Goo, DO;  Location: [facility] OR;  Service: Neurosurgery;  Laterality: N/A;   COLONOSCOPY     IR ANGIOGRAM EXTREMITY RIGHT  03/05/2020   IR BONE MARROW BIOPSY & ASPIRATION  09/02/2022   IR BONE TUMOR(S)RF ABLATION  06/23/2022   IR FEM POP ART PTA MOD SED  03/05/2020   IR KYPHO THORACIC WITH BONE BIOPSY  06/23/2022   IR RADIOLOGIST EVAL & MGMT  02/26/2020   IR RADIOLOGIST EVAL & MGMT  04/15/2020   IR RADIOLOGIST EVAL & MGMT  07/22/2020   IR RADIOLOGIST EVAL &  MGMT  05/24/2022   IR RADIOLOGIST EVAL & MGMT  07/07/2022   IR TIB-PERO ART ATHEREC INC PTA MOD SED  03/05/2020   IR US GUIDE VASC ACCESS RIGHT  03/05/2020    PROSTATE BIOPSY     RADIOACTIVE SEED IMPLANT N/A 06/05/2015   Procedure: RADIOACTIVE SEED IMPLANT/BRACHYTHERAPY IMPLANT;  Surgeon: Ihor Gully, MD;  Location: [facility];  Service: Urology;  Laterality: N/A;   ROTATOR CUFF REPAIR Right 2014   UMBILICAL HERNIA REPAIR  1990's   UPPER GASTROINTESTINAL ENDOSCOPY     wisdom teeth extrat      Current Outpatient Medications  Medication Sig Dispense Refill   aspirin EC 81 MG tablet Take 1 tablet (81 mg total) by mouth daily. 30 tablet 0   benazepril (LOTENSIN) 40 MG tablet Take 40 mg by mouth daily.     donepezil (ARICEPT) 10 MG tablet Take 5mg (1/2 pill) a pill at bedtime for 2 weeks and if no side effects increase to 10mg (whole pill) at bedtime for memory 90 tablet 3   ezetimibe (ZETIA) 10 MG tablet Take 10 mg by mouth daily.     FARXIGA 10 MG TABS tablet Take 10 mg by mouth daily.     loperamide (IMODIUM) 2 MG capsule Take 2 mg by mouth every 6 (six) hours as needed for diarrhea or loose stools.  0   meclizine (ANTIVERT) 25 MG tablet Take 1 tablet (25 mg total) by mouth 3 (three) times daily as needed for dizziness. 30 tablet 2   methocarbamol (ROBAXIN) 500 MG tablet Take 1 tablet (500 mg total) by mouth 4 (four) times daily. 90 tablet 2   NOVOLOG FLEXPEN 100 UNIT/ML FlexPen Inject 22 Units into the skin 3 (three) times daily with meals.  6   ONETOUCH VERIO test strip USE TO TEST BLOOD SUGAR 3 TIMES A DAY PRIOR TO MEALS AND RECORD     No current facility-administered medications for this visit.    Allergies as of 01/12/2023   (No Known Allergies)    Vitals: Today's Vitals   01/12/23 1301  BP: 138/66  Pulse: 66  Weight: 169 lb 3.2 oz (76.7 kg)  Height: 5\' 9"  (1.753 m)    Body mass index is 24.99 kg/m.   Physical exam: Exam: Gen: NAD, conversant                     CV: RRR, no MRG. No Carotid Bruits. No peripheral edema, warm, nontender Eyes: Conjunctivae clear without exudates or  hemorrhage  Neuro: Detailed Neurologic Exam  Speech:    Speech is normal; fluent and spontaneous with normal comprehension.  Cognition:      01/12/2023    2:04 PM  MMSE - Mini Mental State Exam  Orientation to time 3  Orientation to Place 5  Registration 3  Attention/ Calculation 1  Recall 1  Language- name 2 objects 2  Language- repeat 1  Language- follow 3 step command 3  Language- read & follow direction 1  Write a sentence 1  Copy design 1  Total score 22     Cranial Nerves:    The pupils are equal, round, and reactive to light. Attempted, fundi too small to visualize. Visual fields are full to finger confrontation. Extraocular movements are intact. Trigeminal sensation is intact and the muscles of mastication are normal. The face is symmetric. The palate elevates in the midline. Hearing intact. Voice is normal. Shoulder shrug is normal. The tongue has normal motion without fasciculations.  Coordination: nml  Gait:   Wide based  Motor Observation:    No asymmetry, no atrophy, and no involuntary movements noted. Tone:    Normal muscle tone.    Posture:    Posture is normal. normal erect    Strength:    Strength is V/V in the upper and lower limbs.      Sensation: intact to LT     Reflex Exam:  DTR's:    Deep tendon reflexes in the upper and lower extremities are symmetrical  bilaterally.  Brisk uppers and lowers. Absent AJs.  Toes:    The toes are downgoing bilaterally.   Clonus:    Clonus is absent.   Assessment/Plan:  67 year old with chronic persistent worsening dizziness/vertigo.  Seen by [place] previously multiple times back through 2020(carotid dopplers 06/2019 normal) with imaging showing evidence of multiple pontine infarcts on imaging likely incidental in setting of small vessel disease.George Franco is a 67 y.o. male here as requested by Adrian Prince, MD. 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 Last seen 06/26/2021.   We have seen patient for multiple symptoms including vertigo, cervical stenosis(s/p surgery),, strokes, imbalance. Had surgery for multiple cervical stenosis  wife provides most information. Here today for persistent dizziness, imbalance, cognitive decline. He still feels dizzy. He doesn't have a good appetite, he is drinking fluids, Imbalance likely multifactorial: we have sent to vestibular therapy, he has diabetic polyneuropathy, prior cervical stenosis sequelae, multiple strokes, mRI with white matter changes which can also cause cognitive changes as well as gait problems, Stable, no falls. Get up and has dizziness, also when turning head, constant dizziness. He saw Dr. Kieth Brightly and has likely vascular dementia and he suggests sleep test. He also has chronic low back pain which may ne affecting his mbalance an no one has ever MRIed his lower back.   Dr. Marvetta Gibbons notes:  I suspect that the primary etiological factor creating progressive changes in cognitive functioning are related to significant microvascular ischemic changes and potential small lacunar infarctions.  This combined with significant pain conditions, dizziness and vertigo, sustained sleep disturbance, and peripheral neuropathy secondary to type 2 diabetes  that is insulin-dependent are also having a contributing factor.  Other issues of concern of the possibility of an underlying obstructive sleep apnea.   I would recommend that the patient have assessment for possible obstructive sleep apnea.  While the level of cognitive deficits could not be explained by this factor alone it is something that given his issues with hypertension, indications of microvascular ischemic changes/small vessel disease, memory and cognitive deficits would suggest that this assessment would be warranted with the patient having multiple risk factors for obstructive sleep apnea.  I will sit down with the patient and his wife and go over the results of the current neuropsychological evaluation.   Wife is concerned for his memory. MMSE 22/30. He has been diagnosed with vascular dementia by formal  memory testing.   Worsening dizziness and cognitive decline, dementia:  MRI of the brain w/wo contrast Sleep study: Formal neurocognitive testing indicated need for sleep evaluation Start Aricept/ Donepezil start 5mg  at bedtime and then increase to 10mg  at bedtime. 2. Imbalance, gait abnormality: MRI lumbar spine   Orders Placed This Encounter  Procedures   MR Lumbar Spine W Wo Contrast   MR BRAIN/IAC W WO CONTRAST   Basic Metabolic Panel   Ambulatory referral to Sleep Studies   Meds ordered this encounter  Medications   donepezil (ARICEPT) 10 MG tablet    Sig: Take 5mg (1/2 pill) a pill at bedtime for 2 weeks and if no side effects increase to 10mg (whole pill) at bedtime for memory    Dispense:  90 tablet    Refill:  3   Prior assessments 2022  Prior MRI: MRI of the cervical spine has multi-level moderate and severe stenosis with foraminal stenosis with compresive myelopathy. I think he needs evaluation for cervical decompression.At one level he has compressive myelopathy. I'd like to send him to Dr. Jake Samples and/or Dr. Blima Singer. Including them on this correspindence in case  they feel differently but I think he needs neurosurgical evaluation and surgery. Please discuss with patent and I will place referral to our wonderful neurosurgical colleagues, thanks  As far as his brain,w e can address at a later time, this needs to be first see below  Suspect not drinking enough fluids, encouraged increase in fluids.   Difficulty with full history although likely multifactorial as he reports constant dizziness worsening throughout the day but also reports increased dizziness with position changes or head movements. In the past denied dizziness at rest but today states is continuous even with sitting  Hx of BPPV with some features consistent with BPPV therefore recommended vestibular rehab as well as ongoing use of meclizine per PCP and trial of Zofran as needed.  Advised use of cane at all times for fall prevention. He went to one vestibular PT session and declined any further because of insurance per PT notes.  Will repeat MRI brain and cervical spine with thin cuts through the IAC.  Send to ENT for decreases hearing in right ear and recheck. If no other etiology then can  consider PCP send to the [facility] and we will return patient back to pcp for dizziness but we can continue to follow for strokes.   He does have prior stroke history, diabetic polyneuropathy with chronic gait impairment and imbalance but will check MRI cervical spine  Discussed importance of adequate water intake as well as nutrition as this could be contributing to "constant" dizziness complaint.  Advised him to further follow-up with PCP for further evaluation in this aspect  Follow-up 6 months with Shanda Bumps for strokes  Personally examined patient and images,  personally obtained the history, evaluated lab date, reviewed imaging studies and agree with radiology interpretations.    Naomie Dean, MD  I spent over 55 minutes of face-to-face and non-face-to-face time with patient on the   1. Cognitive decline   2. Mild dementia without behavioral disturbance, psychotic disturbance, mood disturbance, or anxiety, unspecified dementia type (HCC)   3. Ataxia   4. Cerebrovascular accident (CVA), unspecified mechanism (HCC)   5. Confluent parieto-occipital white matter abnormalities present on magnetic resonance imaging   6. Gait abnormality   7. Spinal stenosis of lumbar region, unspecified whether neurogenic claudication present   8. Abnormal DTR (deep tendon reflex)    diagnosis.  This included previsit chart review, lab review, study review, order entry, electronic health record documentation, patient education on the different diagnostic and therapeutic options, counseling and coordination of care, risks and benefits of management, compliance, or risk factor reduction

## 2023-01-12 NOTE — Patient Instructions (Addendum)
MRI of the brain w/wo contrast Sleep study Start Aricept/ Donepezil start 5mg  at bedtime and then increase to 10mg  at bedtime MRI lumbar spine   Donepezil Disintegrating Tablets What is this medication? DONEPEZIL (doe NEP e zil) treats memory loss and confusion (dementia) in people who have dementia disease. It works by improving attention, memory, and the ability to engage in daily activities. It is not a cure for dementia or Alzheimer disease. This medicine may be used for other purposes; ask your health care provider or pharmacist if you have questions. COMMON BRAND NAME(S): Aricept What should I tell my care team before I take this medication? They need to know if you have any of these conditions: Asthma or other lung disease Difficulty passing urine Head injury Heart disease History of irregular heartbeat Liver disease Seizures (convulsions) Stomach or intestinal disease, ulcers or stomach bleeding An unusual or allergic reaction to donepezil, other medications, foods, dyes, or preservatives Pregnant or trying to get pregnant Breast-feeding How should I use this medication? Take this medication by mouth. Follow the directions on the prescription label. Place the tablet in the mouth and allow it to dissolve, then swallow. While you may take these tablets with water, it is not necessary to do so. You may take this medication with or without food. Take your doses at regular intervals. This medication is usually taken before bedtime. Do not take your medication more often than directed. Continue to take your medication even if you feel better. Do not stop taking except on the advice of your care team. Talk to your care team about the use of this medication in children. Special care may be needed. Overdosage: If you think you have taken too much of this medicine contact a poison control center or emergency room at once. NOTE: This medicine is only for you. Do not share this medicine with  others. What if I miss a dose? If you miss a dose, take it as soon as you can. If it is almost time for your next dose, take only that dose. Do not take double or extra doses. What may interact with this medication? Do not take this medication with any of the following: Certain medications for fungal infections like itraconazole, fluconazole, posaconazole, and voriconazole Cisapride Dextromethorphan; quinidine Dronedarone Pimozide Quinidine Thioridazine This medication may also interact with the following: Antihistamines for allergy, cough and cold Atropine Bethanechol Carbamazepine Certain medications for bladder problems like oxybutynin, tolterodine Certain medications for Parkinson's disease like benztropine, trihexyphenidyl Certain medications for stomach problems like dicyclomine, hyoscyamine Certain medications for travel sickness like scopolamine Dexamethasone Dofetilide Ipratropium NSAIDs, medications for pain and inflammation, like ibuprofen or naproxen Other medications for Alzheimer's disease Other medications that prolong the QT interval (cause an abnormal heart rhythm) Phenobarbital Phenytoin Rifampin, rifabutin or rifapentine Ziprasidone This list may not describe all possible interactions. Give your health care provider a list of all the medicines, herbs, non-prescription drugs, or dietary supplements you use. Also tell them if you smoke, drink alcohol, or use illegal drugs. Some items may interact with your medicine. What should I watch for while using this medication? Visit your care team for regular checks on your progress. Check with your care team if your symptoms do not get better or if they get worse. You may get drowsy or dizzy. Do not drive, use machinery, or do anything that needs mental alertness until you know how this medication affects you. What side effects may I notice from receiving this medication? Side effects  that you should report to your care  team as soon as possible: Allergic reactions--skin rash, itching, hives, swelling of the face, lips, tongue, or throat Peptic ulcer--burning stomach pain, loss of appetite, bloating, burping, heartburn, nausea, vomiting Seizures Slow heartbeat--dizziness, feeling faint or lightheaded, confusion, trouble breathing, unusual weakness or fatigue Stomach bleeding--bloody or black, tar-like stools, vomiting blood or brown material that looks like coffee grounds Trouble passing urine Side effects that usually do not require medical attention (report these to your care team if they continue or are bothersome): Diarrhea Fatigue Loss of appetite Muscle pain or cramps Nausea Trouble sleeping This list may not describe all possible side effects. Call your doctor for medical advice about side effects. You may report side effects to FDA at 1-800-FDA-1088. Where should I keep my medication? Keep out of reach of children. Store at room temperature between 15 and 30 degrees C (59 and 86 degrees F). Throw away any unused medication after the expiration date. NOTE: This sheet is a summary. It may not cover all possible information. If you have questions about this medicine, talk to your doctor, pharmacist, or health care provider.  2024 Elsevier/Gold Standard (2021-03-10 00:00:00)   Vascular Dementia Dementia is a condition in which a person has problems with thinking, memory, and behavior that are severe enough to interfere with daily life. Vascular dementia is a type of dementia. It results from brain damage that is caused by the brain not getting enough blood. This condition may also be called vascular cognitive impairment. What are the causes? Vascular dementia is caused by conditions that reduce blood flow to the brain. Common causes of this condition include: Multiple small strokes. These may happen without symptoms (silent stroke). Major stroke. Damage to small blood vessels in the brain (cerebral  small vessel disease). What increases the risk? The following factors may make you more likely to develop this condition: Having had a stroke. Having high blood pressure (hypertension) or high cholesterol. Having a disease that affects the heart or blood vessels. Smoking. Not being active. Being over age 27. Having any of these conditions: Diabetes. Metabolic syndrome. Obesity. Depression. A genetic condition that leads to stroke, such as CADASIL (cerebral autosomal dominant arteriopathy with subcortical infarcts and leukoencephalopathy). What are the signs or symptoms? Symptoms of vascular dementia can vary from one person to another. Symptoms may be mild or severe depending on the amount of damage and which parts of the brain have been affected. Symptoms may begin suddenly or may develop slowly. Mental symptoms may include: Confusion and memory problems. Poor attention and concentration. Trouble understanding speech. Depression. Personality changes. Trouble recognizing familiar people. Agitation or aggression. Paranoia or false beliefs (delusions). Hallucinations. These involve seeing, hearing, tasting, smelling, or feeling things that are not real. Physical symptoms may include: Weakness. Poor balance. Loss of bladder or bowel control (incontinence). Unsteady walking (gait). Speaking problems. Behavioral symptoms may include: Getting lost in familiar places. Problems with planning and judgment. Trouble following instructions. Social problems. Emotional outbursts. Trouble with daily activities and self-care. Problems handling money. Symptoms may remain stable, or they may get worse over time. Symptoms of vascular dementia may be similar to those of Alzheimer's disease. The two conditions can occur together (mixed dementia). How is this diagnosed? This condition may be diagnosed based on: Your medical history and a physical exam. Symptoms or changes that are reported by  friends and family. Lab tests or other tests that check brain and nervous system function. Tests may include: Blood tests.  Brain imaging tests. Tests of movement, speech, and other daily activities (neurological exam). Tests of memory, thinking, and problem-solving (neuropsychological or neurocognitive testing). There is not a specific test to diagnose vascular dementia. Diagnosis may involve several specialists. These may include: A health care provider who specializes in the brain and nervous system (neurologist). A health care provider who specializes in understanding how problems in the brain can alter behavior and cognitive function (neuropsychologist). How is this treated? There is no cure for vascular dementia. Brain damage that has already occurred cannot be reversed. Treatment depends on: How severe the condition is. Which parts of your brain have been affected. Your overall health. Treatment measures aim to: Treat the underlying cause of vascular dementia and manage risk factors. This may include: Controlling blood pressure or lowering cholesterol. Treating diabetes. Making lifestyle changes, such as quitting smoking or losing weight. Manage symptoms. Prevent further brain damage. Improve the person's health and quality of life. Treatment for dementia may involve a team of health care providers, including: A neurologist. A provider who specializes in disorders of the mind (psychiatrist). A provider who specializes in helping people learn daily living skills (occupational therapist). A provider who focuses on speech and language changes (Doctor, general practice). A heart specialist (cardiologist). A provider who helps people learn how to manage physical changes, such as movement and walking (exercise physiologist or physical therapist). Follow these instructions at home: Medicines Take over-the-counter and prescription medicines only as told by your health care provider. Use a  pill organizer or pill reminder to help you manage your medicines. Lifestyle Do not use any products that contain nicotine or tobacco, such as cigarettes, e-cigarettes, and chewing tobacco. If you need help quitting, ask your health care provider. Eat a healthy, balanced diet. Maintain a healthy weight, or lose weight if needed. Be physically active as told by your health care provider. General instructions Work with your health care provider to determine what you need help with and what your safety needs are. Follow the health care provider's instructions for treating the condition that caused the dementia. Keep all follow-up visits. This is important. If you are the caregiver:  People with vascular dementia may need regular help at home or daily care from a family member or home health care worker. Home care for a person with vascular dementia depends on what caused the condition and how severe the symptoms are. General guidelines for caregivers include: Help the person with dementia remember people, appointments, and daily activities. Help the person with dementia manage his or her medicines. Help family and friends learn about ways to communicate with the person with dementia. Create a safe living space to reduce the risk of injury or falls. Find a support group to help caregivers and family cope with the effects of dementia.  Where to find more information [employer] of Neurological Disorders and Stroke: [employer] Contact a health care provider if: New behavioral problems develop. Problems with swallowing develop. Confusion gets worse. Sleepiness gets worse. Get help right away if: Loss of consciousness occurs. There is a sudden loss of speech, balance, or thinking ability. New numbness or paralysis occurs. Sudden, severe headache occurs. Vision is lost or suddenly gets worse in one or both eyes. If you ever feel like the person with dementia may hurt himself or  herself or others, or if he or she shares thoughts about taking his or her own life, get help right away. You can go to your nearest emergency department or: Call your  local emergency services (911 in the [place] Call a suicide crisis helpline, such as the National Suicide Prevention Lifeline at 530-519-5100 or 988 in the [place] This is open 24 hours a day in the [place] Text the Crisis Text Line at 669-236-9948 (in the [place] Summary Vascular dementia is a type of dementia. It results from brain damage that is caused by the brain not getting enough blood. Vascular dementia is caused by conditions that reduce blood flow to the brain. Common causes of this condition include stroke and damage to small blood vessels in the brain. Treatment focuses on treating the underlying cause of vascular dementia and managing any risk factors. People with vascular dementia may need regular help at home or daily care from a family member or home health care worker. Contact a health care provider if you or your caregiver notices any new symptoms. This information is not intended to replace advice given to you by your health care provider. Make sure you discuss any questions you have with your health care provider. Document Revised: 02/17/2021 Document Reviewed: 12/09/2019 Elsevier Patient Education  2024 [employer].

## 2023-01-19 ENCOUNTER — Telehealth: Payer: Self-pay | Admitting: Neurology

## 2023-01-19 NOTE — Telephone Encounter (Signed)
Ethlyn Gallery: [phone] exp. 01/19/23-02/18/23 sent to [facility] [phone]

## 2023-01-24 NOTE — Telephone Encounter (Signed)
Message from [facility]: UNABLE TO SCHEDULE , PT CANNOT STAND  OR PIVOT WITHOUT ASSISTANCE , THEY DECLINED COMING BY AMBULANCE

## 2023-01-25 ENCOUNTER — Inpatient Hospital Stay: Payer: Medicare HMO | Attending: Endocrinology

## 2023-01-25 ENCOUNTER — Other Ambulatory Visit: Payer: Self-pay | Admitting: Hematology and Oncology

## 2023-01-25 DIAGNOSIS — D472 Monoclonal gammopathy: Secondary | ICD-10-CM

## 2023-01-26 DIAGNOSIS — E113293 Type 2 diabetes mellitus with mild nonproliferative diabetic retinopathy without macular edema, bilateral: Secondary | ICD-10-CM | POA: Diagnosis not present

## 2023-01-26 DIAGNOSIS — Z794 Long term (current) use of insulin: Secondary | ICD-10-CM | POA: Diagnosis not present

## 2023-01-26 DIAGNOSIS — H40013 Open angle with borderline findings, low risk, bilateral: Secondary | ICD-10-CM | POA: Diagnosis not present

## 2023-01-31 ENCOUNTER — Ambulatory Visit (INDEPENDENT_AMBULATORY_CARE_PROVIDER_SITE_OTHER): Payer: Medicare HMO | Admitting: Podiatry

## 2023-01-31 ENCOUNTER — Telehealth: Payer: Self-pay | Admitting: Hematology and Oncology

## 2023-01-31 ENCOUNTER — Encounter: Payer: Self-pay | Admitting: Podiatry

## 2023-01-31 VITALS — BP 132/66 | HR 62

## 2023-01-31 DIAGNOSIS — E1142 Type 2 diabetes mellitus with diabetic polyneuropathy: Secondary | ICD-10-CM

## 2023-01-31 DIAGNOSIS — M79675 Pain in left toe(s): Secondary | ICD-10-CM

## 2023-01-31 DIAGNOSIS — B351 Tinea unguium: Secondary | ICD-10-CM | POA: Diagnosis not present

## 2023-01-31 DIAGNOSIS — M79674 Pain in right toe(s): Secondary | ICD-10-CM | POA: Diagnosis not present

## 2023-01-31 NOTE — Telephone Encounter (Signed)
Patient's wife left me a message asking to get him scheduled at the hospital. Call her phone to schedule.

## 2023-02-01 ENCOUNTER — Inpatient Hospital Stay: Payer: Medicare HMO | Admitting: Hematology and Oncology

## 2023-02-01 ENCOUNTER — Inpatient Hospital Stay: Payer: Medicare HMO

## 2023-02-01 NOTE — Telephone Encounter (Signed)
Ethlyn Gallery: [id number] exp. 02/01/23-03/03/23 sent to [facility] [phone]

## 2023-02-05 NOTE — Progress Notes (Signed)
  Subjective:  Patient ID: George Franco, male    DOB: 20-Oct-1955,  MRN: [id number]  George Franco presents to clinic today for at risk foot care with history of diabetic neuropathy and painful thick toenails that are difficult to trim. Pain interferes with ambulation. Aggravating factors include wearing enclosed shoe gear. Pain is relieved with periodic professional debridement.  Chief Complaint  Patient presents with   Diabetes    "Check my feet and do my toenails."  Dr. Evlyn Kanner - 10/20/2022, 147 mg/dl today   New problem(s): None.   PCP is [place], Jeannett Senior, MD.  No Known Allergies  Review of Systems: Negative except as noted in the HPI.  Objective: No changes noted in today's physical examination. Vitals:   01/31/23 1529  BP: 132/66  Pulse: 62   George Franco is a pleasant 67 y.o. male WD, WN in NAD. AAO x 3.  Vascular Examination: CFT <3 seconds b/l LE. Palpable DP pulse(s) b/l LE. Palpable PT pulse(s) b/l LE. Pedal hair absent. No pain with calf compression b/l. Lower extremity skin temperature gradient within normal limits. No edema noted b/l LE. No varicosities b/l LE. No ischemia or gangrene noted b/l LE. No cyanosis or clubbing noted b/l LE.  Dermatological Examination: Pedal integument with normal turgor, texture and tone BLE. No open wounds b/l LE. Toenails 1-5 b/l elongated, discolored, dystrophic, thickened, crumbly with subungual debris and tenderness to dorsal palpation. No hyperkeratotic nor porokeratotic lesions present on today's visit.  Neurological Examination: Pt has subjective symptoms of neuropathy. Protective sensation intact 5/5 intact bilaterally with 10g monofilament b/l. Vibratory sensation intact b/l.  Musculoskeletal Examination: Muscle strength 5/5 to all lower extremity muscle groups bilaterally. HAV with bunion bilaterally and hammertoes 2-5 b/l. Utilizes cane for ambulation assistance.  Assessment/Plan: 1. Pain due to onychomycosis of toenails of  both feet   2. Diabetic peripheral neuropathy associated with type 2 diabetes mellitus (HCC)     -Consent given for treatment as described below: -Examined patient. -Continue supportive shoe gear daily. -Mycotic toenails 1-5 bilaterally were debrided in length and girth with sterile nail nippers and dremel without incident. -Patient/POA to call should there be question/concern in the interim.   Return in about 3 months (around 05/03/2023).  George Franco, DPM

## 2023-02-21 ENCOUNTER — Ambulatory Visit (HOSPITAL_COMMUNITY): Payer: Medicare HMO

## 2023-02-22 ENCOUNTER — Ambulatory Visit: Payer: Medicare HMO | Admitting: Neurology

## 2023-03-06 ENCOUNTER — Inpatient Hospital Stay: Payer: Medicare HMO | Admitting: Hematology and Oncology

## 2023-03-06 ENCOUNTER — Other Ambulatory Visit: Payer: Self-pay | Admitting: Neurology

## 2023-03-06 ENCOUNTER — Inpatient Hospital Stay: Payer: Medicare HMO | Attending: Endocrinology

## 2023-03-06 ENCOUNTER — Other Ambulatory Visit: Payer: Self-pay

## 2023-03-06 VITALS — BP 127/70 | HR 64 | Temp 97.4°F | Resp 13 | Wt 163.4 lb

## 2023-03-06 DIAGNOSIS — F17210 Nicotine dependence, cigarettes, uncomplicated: Secondary | ICD-10-CM | POA: Diagnosis not present

## 2023-03-06 DIAGNOSIS — F1090 Alcohol use, unspecified, uncomplicated: Secondary | ICD-10-CM | POA: Insufficient documentation

## 2023-03-06 DIAGNOSIS — D472 Monoclonal gammopathy: Secondary | ICD-10-CM | POA: Diagnosis not present

## 2023-03-06 DIAGNOSIS — M4854XA Collapsed vertebra, not elsewhere classified, thoracic region, initial encounter for fracture: Secondary | ICD-10-CM | POA: Diagnosis not present

## 2023-03-06 LAB — CMP (CANCER CENTER ONLY)
ALT: 27 U/L (ref 0–44)
AST: 33 U/L (ref 15–41)
Albumin: 4.1 g/dL (ref 3.5–5.0)
Alkaline Phosphatase: 68 U/L (ref 38–126)
Anion gap: 9 (ref 5–15)
BUN: 7 mg/dL — ABNORMAL LOW (ref 8–23)
CO2: 26 mmol/L (ref 22–32)
Calcium: 9.7 mg/dL (ref 8.9–10.3)
Chloride: 104 mmol/L (ref 98–111)
Creatinine: 0.76 mg/dL (ref 0.61–1.24)
GFR, Estimated: >60 mL/min (ref >60)
Glucose, Bld: 139 mg/dL — ABNORMAL HIGH (ref 70–99)
Potassium: 4.1 mmol/L (ref 3.5–5.1)
Sodium: 139 mmol/L (ref 135–145)
Total Bilirubin: 0.5 mg/dL (ref 0.3–1.2)
Total Protein: 6.8 g/dL (ref 6.5–8.1)

## 2023-03-06 LAB — CBC WITH DIFFERENTIAL (CANCER CENTER ONLY)
Abs Immature Granulocytes: 0.02 10*3/uL (ref 0.00–0.07)
Basophils Absolute: 0.1 10*3/uL (ref 0.0–0.1)
Basophils Relative: 1 %
Eosinophils Absolute: 0.1 10*3/uL (ref 0.0–0.5)
Eosinophils Relative: 2 %
HCT: 36.9 % — ABNORMAL LOW (ref 39.0–52.0)
Hemoglobin: 12.6 g/dL — ABNORMAL LOW (ref 13.0–17.0)
Immature Granulocytes: 0 %
Lymphocytes Relative: 25 %
Lymphs Abs: 1.5 10*3/uL (ref 0.7–4.0)
MCH: 32.6 pg (ref 26.0–34.0)
MCHC: 34.1 g/dL (ref 30.0–36.0)
MCV: 95.3 fL (ref 80.0–100.0)
Monocytes Absolute: 0.5 10*3/uL (ref 0.1–1.0)
Monocytes Relative: 9 %
Neutro Abs: 3.8 10*3/uL (ref 1.7–7.7)
Neutrophils Relative %: 63 %
Platelet Count: 190 10*3/uL (ref 150–400)
RBC: 3.87 MIL/uL — ABNORMAL LOW (ref 4.22–5.81)
RDW: 13.9 % (ref 11.5–15.5)
WBC Count: 6 10*3/uL (ref 4.0–10.5)
nRBC: 0 % (ref 0.0–0.2)

## 2023-03-06 LAB — LACTATE DEHYDROGENASE: LDH: 147 U/L (ref 98–192)

## 2023-03-06 NOTE — Progress Notes (Unsigned)
[facility] [phone]   [phone]  PROGRESS NOTE  Patient Care Team: Adrian Prince, MD as PCP - General (Endocrinology)  Hematological/Oncological History # IgG Kappa Monoclonal Gammopathy  # T6 Compression Fracture  04/15/2022: M protein 0.3, IFE shows IgG Kappa monoclonal protein 06/23/2022: T6 compression fracture. Underwent kyphoplasty and biopsy, negative for carcinoma.  07/29/2022: establish care with Dr. Leonides Schanz  09/02/2022: Bone marrow biopsy revealed normocellular bone marrow (40%) with trilineage hematopoiesis, and approximately 5% plasma cells by CD138 immunohistochemical analysis   Interval History:  George Franco 67 y.o. male with medical history significant for IgG kappa MGUS who presents for a follow up visit. The patient's last visit was on 07/29/2022 at which time he established care. In the interim since the last visit he underwent a bone marrow biopsy which confirmed he has MGUS.  On exam today George Franco.  He notes that his energy and appetite are low and he does not sleep well at night.  He is losing weight, with his weight having gone from 177 pounds down to 163 pounds.  He is not having any bone or back pain.  He is also not having any change in his urine but no change in the color, smell, and no foam of the urine.  He notes that he does chew his food but does not always have a desire to swallow.  He enjoys ice cream and Jell-O but is not eating much in the way of red meat.  His bowels are moving well and he is not suffering from constipation.  He otherwise denies any fevers, chills, sweats, nausea, vomiting or diarrhea.  A full 10 point ROS is otherwise negative.  MEDICAL HISTORY:  Past Medical History:  Diagnosis Date   At risk for sleep apnea    STOP-BANG= 5     SENT TO PCP 06-02-2015   BPH (benign prostatic hyperplasia)    CTS (carpal tunnel syndrome)    CTS (carpal tunnel syndrome)    ED  (erectile dysfunction) of organic origin    Hyperlipidemia    Hypertension    Hypogonadism in male    Knee pain    pt said he doesn't know anything about this   Lower urinary tract symptoms (LUTS)    LVH (left ventricular hypertrophy)    Persistent vertigo of central origin    Prostate cancer [facility]) urologist-  dr ottelin/  oncologist-  dr Kathrynn Running   Stage T1c, Gleason 4+3, PSA 4.48,  vol 32.9cc--  External RXT complete 05-08-2015   Stroke (HCC)    Type 2 diabetes mellitus (HCC)    Vestibular neuropathy    Wears glasses     SURGICAL HISTORY: Past Surgical History:  Procedure Laterality Date   ANTERIOR CERVICAL DECOMP/DISCECTOMY FUSION N/A 11/02/2021   Procedure: Anterior Cervical Decompression Fusion, Cervical four-five, Cervical five-six, Cervical six-seven;  Surgeon: Dawley, Alan Mulder, DO;  Location: [facility] OR;  Service: Neurosurgery;  Laterality: N/A;   COLONOSCOPY     IR ANGIOGRAM EXTREMITY RIGHT  03/05/2020   IR BONE MARROW BIOPSY & ASPIRATION  09/02/2022   IR BONE TUMOR(S)RF ABLATION  06/23/2022   IR FEM POP ART PTA MOD SED  03/05/2020   IR KYPHO THORACIC WITH BONE BIOPSY  06/23/2022   IR RADIOLOGIST EVAL & MGMT  02/26/2020   IR RADIOLOGIST EVAL & MGMT  04/15/2020   IR RADIOLOGIST EVAL & MGMT  07/22/2020   IR RADIOLOGIST EVAL & MGMT  05/24/2022  IR RADIOLOGIST EVAL & MGMT  07/07/2022   IR TIB-PERO ART ATHEREC INC PTA MOD SED  03/05/2020   IR US GUIDE VASC ACCESS RIGHT  03/05/2020   PROSTATE BIOPSY     RADIOACTIVE SEED IMPLANT N/A 06/05/2015   Procedure: RADIOACTIVE SEED IMPLANT/BRACHYTHERAPY IMPLANT;  Surgeon: Ihor Gully, MD;  Location: [facility];  Service: Urology;  Laterality: N/A;   ROTATOR CUFF REPAIR Right 2014   UMBILICAL HERNIA REPAIR  1990's   UPPER GASTROINTESTINAL ENDOSCOPY     wisdom teeth extrat      SOCIAL HISTORY: Social History   Socioeconomic History   Marital status: Married    Spouse name: Not on file   Number of children: 3   Years of  education: Not on file   Highest education level: Not on file  Occupational History   Occupation: Location manager    Employer: [employer]  Tobacco Use   Smoking status: Every Day    Current packs/day: 0.50    Average packs/day: 0.5 packs/day for 20.0 years (10.0 ttl pk-yrs)    Types: Cigarettes   Smokeless tobacco: Never  Vaping Use   Vaping status: Never Used  Substance and Sexual Activity   Alcohol use: Not Currently    Alcohol/week: 2.0 standard drinks of alcohol    Types: 2 Shots of liquor per week   Drug use: No   Sexual activity: Yes    Partners: Female  Other Topics Concern   Not on file  Social History Narrative   Married, 2 sons one daughter   Retired    Caffeine: none    0-1 alcoholic drinks/week   He is a smoker   No drug use   Lives at home with wife   Social Determinants of Corporate investment banker Strain: Not on file  Food Insecurity: Not on file  Transportation Needs: Not on file  Physical Activity: Not on file  Stress: Not on file  Social Connections: Not on file  Intimate Partner Violence: Not on file    FAMILY HISTORY: Family History  Problem Relation Age of Onset   Prostate cancer Father    Diabetes Father    Diabetes Mother    Diabetes Brother    Colon cancer Neg Hx    Esophageal cancer Neg Hx    Liver cancer Neg Hx    Pancreatic cancer Neg Hx    Rectal cancer Neg Hx    Stomach cancer Neg Hx     ALLERGIES:  has No Known Allergies.  MEDICATIONS:  Current Outpatient Medications  Medication Sig Dispense Refill   aspirin EC 81 MG tablet Take 1 tablet (81 mg total) by mouth daily. 30 tablet 0   benazepril (LOTENSIN) 40 MG tablet Take 40 mg by mouth daily.     donepezil (ARICEPT) 10 MG tablet Take 5mg (1/2 pill) a pill at bedtime for 2 weeks and if no side effects increase to 10mg (whole pill) at bedtime for memory 90 tablet 3   ezetimibe (ZETIA) 10 MG tablet Take 10 mg by mouth daily.     FARXIGA 10 MG TABS tablet Take 10 mg by mouth  daily.     loperamide (IMODIUM) 2 MG capsule Take 2 mg by mouth every 6 (six) hours as needed for diarrhea or loose stools.  0   meclizine (ANTIVERT) 25 MG tablet Take 1 tablet (25 mg total) by mouth 3 (three) times daily as needed for dizziness. 30 tablet 2   methocarbamol (ROBAXIN) 500 MG  tablet Take 1 tablet (500 mg total) by mouth 4 (four) times daily. 90 tablet 2   NOVOLOG FLEXPEN 100 UNIT/ML FlexPen Inject 22 Units into the skin 3 (three) times daily with meals.  6   ONETOUCH VERIO test strip USE TO TEST BLOOD SUGAR 3 TIMES A DAY PRIOR TO MEALS AND RECORD     No current facility-administered medications for this visit.    REVIEW OF SYSTEMS:   Constitutional: ( - ) fevers, ( - )  chills , ( - ) night sweats Eyes: ( - ) blurriness of vision, ( - ) double vision, ( - ) watery eyes Ears, nose, mouth, throat, and face: ( - ) mucositis, ( - ) sore throat Respiratory: ( - ) cough, ( - ) dyspnea, ( - ) wheezes Cardiovascular: ( - ) palpitation, ( - ) chest discomfort, ( - ) lower extremity swelling Gastrointestinal:  ( - ) nausea, ( - ) heartburn, ( - ) change in bowel habits Skin: ( - ) abnormal skin rashes Lymphatics: ( - ) new lymphadenopathy, ( - ) easy bruising Neurological: ( - ) numbness, ( - ) tingling, ( - ) new weaknesses Behavioral/Psych: ( - ) mood change, ( - ) new changes  All other systems were reviewed with the patient and are negative.  PHYSICAL EXAMINATION: ECOG PERFORMANCE STATUS: 0 - Asymptomatic  Vitals:   03/06/23 1531  BP: 127/70  Pulse: 64  Resp: 13  Temp: (!) 97.4 F (36.3 C)  SpO2: 100%   Filed Weights   03/06/23 1531  Weight: 163 lb 6.4 oz (74.1 kg)    GENERAL: Well-appearing elderly African-American male, alert, no distress and comfortable SKIN: skin color, texture, turgor are normal, no rashes or significant lesions EYES: conjunctiva are pink and non-injected, sclera clear LUNGS: clear to auscultation and percussion with normal breathing  effort HEART: regular rate & rhythm and no murmurs and no lower extremity edema Musculoskeletal: no cyanosis of digits and no clubbing  PSYCH: alert & oriented x 3, fluent speech NEURO: no focal motor/sensory deficits  LABORATORY DATA:  I have reviewed the data as listed    Latest Ref Rng & Units 03/06/2023    3:20 PM 09/02/2022    8:57 AM 07/29/2022    2:19 PM  CBC  WBC 4.0 - 10.5 K/uL 6.0  5.6  6.1   Hemoglobin 13.0 - 17.0 g/dL 16.1  09.6  04.5   Hematocrit 39.0 - 52.0 % 36.9  38.2  42.4   Platelets 150 - 400 K/uL 190  211  142        Latest Ref Rng & Units 03/06/2023    3:20 PM 07/29/2022    2:19 PM 05/27/2022    8:16 PM  CMP  Glucose 70 - 99 mg/dL 409  82  57   BUN 8 - 23 mg/dL 7  14  13    Creatinine 0.61 - 1.24 mg/dL 8.11  9.14  7.82   Sodium 135 - 145 mmol/L 139  140  141   Potassium 3.5 - 5.1 mmol/L 4.1  3.9  4.1   Chloride 98 - 111 mmol/L 104  102  104   CO2 22 - 32 mmol/L 26  30    Calcium 8.9 - 10.3 mg/dL 9.7  95.6    Total Protein 6.5 - 8.1 g/dL 6.8  8.3    Total Bilirubin 0.3 - 1.2 mg/dL 0.5  0.9    Alkaline Phos 38 - 126 U/L 68  84  AST 15 - 41 U/L 33  45    ALT 0 - 44 U/L 27  55      Lab Results  Component Value Date   MPROTEIN 0.3 (H) 07/29/2022   Lab Results  Component Value Date   KPAFRELGTCHN 37.4 (H) 03/06/2023   KPAFRELGTCHN 39.0 (H) 07/29/2022   LAMBDASER 14.9 03/06/2023   LAMBDASER 13.2 07/29/2022   KAPLAMBRATIO 2.51 (H) 03/06/2023   KAPLAMBRATIO 2.95 (H) 07/29/2022    RADIOGRAPHIC STUDIES: No results found.  ASSESSMENT & PLAN Ensar Aungst 66 y.o. male with medical history significant for IgG kappa MGUS who presents for a follow up visit.   # IgG Kappa Monoclonal Gammopathy  # T6 Compression Fracture  --at each visit will order an SPEP, SFLC and CBC, CMP, and LDH --recommend a metastatic bone survey to assess for lytic lesions yearly. Next due in Jan 2025.  --Bone marrow biopsy on 09/02/2022 confirms MGUS with 5% plasma cells. --  Had a T6 kyphoplasty with biopsy, negative for carcinoma.  At this time this does not appear to be a myelomatous lesion resulting in the fracture. -- Return to clinic in 6 months time for further monitoring.  No orders of the defined types were placed in this encounter.   All questions were answered. The patient knows to call the clinic with any problems, questions or concerns.  A total of more than 30 minutes were spent on this encounter with face-to-face time and non-face-to-face time, including preparing to see the patient, ordering tests and/or medications, counseling the patient and coordination of care as outlined above.   Ulysees Barns, MD Department of Hematology/Oncology [facility] Phone: [phone] Pager: [phone] Email: [email or web address]  03/07/2023 5:20 PM

## 2023-03-06 NOTE — Addendum Note (Signed)
Addended by: Naomie Dean B on: 03/06/2023 11:36 AM   Modules accepted: Orders

## 2023-03-06 NOTE — Telephone Encounter (Signed)
He is scheduled for 7/30, new auth is pending medical review with [employer] [id number]

## 2023-03-07 ENCOUNTER — Ambulatory Visit (HOSPITAL_COMMUNITY): Payer: Medicare HMO

## 2023-03-07 NOTE — Telephone Encounter (Signed)
[employer] Berkley Harvey: [id number] exp. 03/06/2023 - 05/05/2023

## 2023-03-23 DIAGNOSIS — H40013 Open angle with borderline findings, low risk, bilateral: Secondary | ICD-10-CM | POA: Diagnosis not present

## 2023-03-24 ENCOUNTER — Ambulatory Visit (HOSPITAL_COMMUNITY)
Admission: RE | Admit: 2023-03-24 | Discharge: 2023-03-24 | Disposition: A | Discharge status: Home / Self Care | Payer: Medicare HMO | Source: Ambulatory Visit | Attending: Neurology | Admitting: Neurology

## 2023-03-24 DIAGNOSIS — R269 Unspecified abnormalities of gait and mobility: Secondary | ICD-10-CM | POA: Insufficient documentation

## 2023-03-24 DIAGNOSIS — M5127 Other intervertebral disc displacement, lumbosacral region: Secondary | ICD-10-CM | POA: Diagnosis not present

## 2023-03-24 DIAGNOSIS — Z8673 Personal history of transient ischemic attack (TIA), and cerebral infarction without residual deficits: Secondary | ICD-10-CM | POA: Diagnosis not present

## 2023-03-24 DIAGNOSIS — R270 Ataxia, unspecified: Secondary | ICD-10-CM

## 2023-03-24 DIAGNOSIS — I639 Cerebral infarction, unspecified: Secondary | ICD-10-CM | POA: Insufficient documentation

## 2023-03-24 DIAGNOSIS — R292 Abnormal reflex: Secondary | ICD-10-CM

## 2023-03-24 DIAGNOSIS — I651 Occlusion and stenosis of basilar artery: Secondary | ICD-10-CM | POA: Diagnosis not present

## 2023-03-24 DIAGNOSIS — F03A0 Unspecified dementia, mild, without behavioral disturbance, psychotic disturbance, mood disturbance, and anxiety: Secondary | ICD-10-CM | POA: Insufficient documentation

## 2023-03-24 DIAGNOSIS — R4189 Other symptoms and signs involving cognitive functions and awareness: Secondary | ICD-10-CM | POA: Diagnosis not present

## 2023-03-24 DIAGNOSIS — M48061 Spinal stenosis, lumbar region without neurogenic claudication: Secondary | ICD-10-CM | POA: Insufficient documentation

## 2023-03-24 DIAGNOSIS — R42 Dizziness and giddiness: Secondary | ICD-10-CM | POA: Diagnosis not present

## 2023-03-24 DIAGNOSIS — R9082 White matter disease, unspecified: Secondary | ICD-10-CM | POA: Insufficient documentation

## 2023-03-24 DIAGNOSIS — M47816 Spondylosis without myelopathy or radiculopathy, lumbar region: Secondary | ICD-10-CM | POA: Diagnosis not present

## 2023-03-24 MED ORDER — GADOBUTROL 1 MMOL/ML IV SOLN
7.0000 mL | Freq: Once | INTRAVENOUS | Status: AC | PRN
  Administered 2023-03-24: 7 mL via INTRAVENOUS

## 2023-03-27 ENCOUNTER — Telehealth: Payer: Self-pay | Admitting: Neurology

## 2023-03-27 NOTE — Telephone Encounter (Signed)
Angel: He has slowly progressive atrophy of the cerebellum (back of the brain) and the pons(middle of the brain) which may be a disease called Multiple system atrophy, also called MSA. Which can cause a patient to be off balance and have dizziness and difficulty walking. There is no treatment for this. This is difficult to explain over the phone so could you please just schedule a sooner appointment  with me so I can review this in person with them? Just tell them I want to review the imaging, there is nothing new so you can tell them that and I can show them the slow chronic progression in person I'll come talk to you about schedules thanks

## 2023-03-27 NOTE — Telephone Encounter (Signed)
LVM asking pt to call back to schedule appointment to discuss imaging results with Dr. Lucia Gaskins

## 2023-03-28 NOTE — Telephone Encounter (Signed)
Contacted pt x2, LVM rq call call back. MD wants to schedule him sept 10th 1130. Slot is on hold

## 2023-03-29 NOTE — Telephone Encounter (Signed)
Jael states pt's wife called back and accepted 9/10 11:30 appt.

## 2023-03-30 ENCOUNTER — Telehealth: Payer: Self-pay | Admitting: *Deleted

## 2023-03-30 NOTE — Telephone Encounter (Signed)
-----   Message from Anson Fret sent at 03/27/2023  2:42 PM EDT ----- Lawanna Kobus: He has slowly progressive atrophy of the cerebellum (back of the brain) and the pons(middle of the brain) which may be a disease called Multiple system atrophy, also called MSA. Which can cause a patient to be off balance and have dizziness and difficulty walking. There is no treatment for this. This is difficult to explain over the phone so could you please just schedule a sooner appointment  with me so I can review this in person with them? Just tell them I want to review the imaging, there is nothing new so you can tell them that and I can show them the slow chronic progression in person I'll come talk to you about schedules thanks (Dr. Cranston Neighbor)

## 2023-03-30 NOTE — Telephone Encounter (Signed)
Pt has f/u appointment  04/18/2023 with Dr. Lucia Gaskins

## 2023-04-13 DIAGNOSIS — E1159 Type 2 diabetes mellitus with other circulatory complications: Secondary | ICD-10-CM | POA: Diagnosis not present

## 2023-04-13 DIAGNOSIS — E785 Hyperlipidemia, unspecified: Secondary | ICD-10-CM | POA: Diagnosis not present

## 2023-04-13 DIAGNOSIS — R351 Nocturia: Secondary | ICD-10-CM | POA: Diagnosis not present

## 2023-04-13 DIAGNOSIS — Z23 Encounter for immunization: Secondary | ICD-10-CM | POA: Diagnosis not present

## 2023-04-13 DIAGNOSIS — R3911 Hesitancy of micturition: Secondary | ICD-10-CM | POA: Diagnosis not present

## 2023-04-13 DIAGNOSIS — I129 Hypertensive chronic kidney disease with stage 1 through stage 4 chronic kidney disease, or unspecified chronic kidney disease: Secondary | ICD-10-CM | POA: Diagnosis not present

## 2023-04-13 DIAGNOSIS — N182 Chronic kidney disease, stage 2 (mild): Secondary | ICD-10-CM | POA: Diagnosis not present

## 2023-04-13 DIAGNOSIS — Z794 Long term (current) use of insulin: Secondary | ICD-10-CM | POA: Diagnosis not present

## 2023-04-18 ENCOUNTER — Ambulatory Visit: Payer: Medicare HMO | Admitting: Neurology

## 2023-04-18 ENCOUNTER — Encounter: Payer: Self-pay | Admitting: Neurology

## 2023-04-18 VITALS — BP 123/69 | HR 53 | Ht 69.0 in | Wt 169.0 lb

## 2023-04-18 DIAGNOSIS — G238 Other specified degenerative diseases of basal ganglia: Secondary | ICD-10-CM | POA: Diagnosis not present

## 2023-04-18 DIAGNOSIS — R634 Abnormal weight loss: Secondary | ICD-10-CM

## 2023-04-18 DIAGNOSIS — G20C Parkinsonism, unspecified: Secondary | ICD-10-CM

## 2023-04-18 DIAGNOSIS — G319 Degenerative disease of nervous system, unspecified: Secondary | ICD-10-CM

## 2023-04-18 DIAGNOSIS — R400 Somnolence: Secondary | ICD-10-CM | POA: Diagnosis not present

## 2023-04-18 DIAGNOSIS — R1310 Dysphagia, unspecified: Secondary | ICD-10-CM

## 2023-04-18 NOTE — Progress Notes (Unsigned)
[id number]    Provider:  Dr Lucia Gaskins Requesting Provider: Adrian Prince, MD Primary Care Provider:  Adrian Prince, MD    CC: cognitive decline  Cannot FTN no rigidity in the limbs Falling asleep  Dr Frances Furbish   Not swallowing. Chews and spits food out. Down to 163. Need swallow study and needs a DAT scan.   01/12/2023:  George Franco is a 67 y.o. male here as requested by Adrian Prince, MD. 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 Last seen 06/26/2021.   We have seen patient for multiple symptoms including vertical, cervical stenosis, strokes, imbalance. Had surgery for multiple cervical stenosis  wife provides most information. Here today for cognitive decline. He still feels dizzy. He doesn't have a good appetite, he is drinking fluids, we have done most everything we can do, sent to vestibular therapy, he has diabetic polyneuropathy, prior cervical stenosis sequelae, multiple strokes, mRI with white matter changes which can also cause cognitive changes as well as gait problems, Stable, no falls. Get up and has dizziness, also when turning head, constant dizziness. He saw Dr. Kieth Brightly and has likely vascular dementia and he suggests sleep test. No back pain. No neck  pain.   Dr. Marvetta Gibbons notes:  I suspect that the primary etiological factor creating progressive changes in cognitive functioning are related to significant microvascular ischemic changes and potential small lacunar infarctions.  This combined with significant pain conditions, dizziness and vertigo, sustained sleep disturbance, and peripheral neuropathy secondary to type 2 diabetes that is insulin-dependent are also having a contributing factor.  Other issues of concern of the possibility of an underlying obstructive sleep apnea.    I would recommend that the patient have assessment for possible obstructive sleep apnea.  While the level of cognitive deficits could not be explained by this factor alone it is something that given his issues with hypertension, indications of microvascular ischemic changes/small vessel disease, memory and cognitive deficits would suggest that this assessment would be warranted with the patient having multiple risk factors for obstructive sleep apnea.  I will sit down with the patient and his wife and go over the results of the current neuropsychological evaluation.   Wife is concerned for his memory. MMSE 22/30. He has been diagnosed with vascular dementia by formal memory testing. He also has chronic low back pain which may ne affecting his mbalance an no one has ever MRIed his lower back.     01/12/2023    2:04 PM  MMSE - Mini Mental State Exam  Orientation to time 3  Orientation to Place 5  Registration 3  Attention/ Calculation 1  Recall 1  Language- name 2 objects 2  Language- repeat 1  Language- follow 3 step command 3  Language- read & follow direction 1  Write a sentence 1  Copy design 1  Total score 22   Patient complains of symptoms per HPI as well as the following  symptoms: cognitive decline . Pertinent negatives and positives per HPI. All others negative   Reviewed notes, labs and imaging from outside physicians, which showed:  MRI of the  cervical spine has multi-level moderate and severe stenosis with foraminal stenosis with compresive myelopathy. I think he needs evaluation for cervical decompression.At one level he has compressive myelopathy. I'd like to send him to Dr. Jake Samples and/or Dr. Blima Singer. Including them on this correspindence in case they feel differently but I think he needs neurosurgical evaluation and surgery. Please discuss with patent and I will place referral to our wonderful neurosurgical colleagues, thanks  As far as his brain,w e can address at a later time, this needs to be first see below   IMPRESSION: This MRI of the cervical spine without contrast shows the following: 1.   Increased T2 signal within the right hemicord adjacent to C5 and C6-C7, consistent with compressive myelopathic change. 2.   At C3-C4, there is mild spinal stenosis and moderate left foraminal narrowing though there does not appear to be nerve root compression. 3.   At C4-C5, there is moderate spinal stenosis and moderate to moderately severe bilateral foraminal narrowing with some potential for C5 nerve root compression to either side. 4.   At C5-C6, there is moderately severe spinal stenosis and moderately severe bilateral foraminal narrowing with potential for C6 nerve root compression to either side. 5.   At C6-C7, there is moderately severe spinal stenosis, more to the right, and moderately severe left foraminal narrowing with potential for left C7 nerve root compression.  Narrative & Impression    [facility] [address] [place], [place] [address] [phone]   NEUROIMAGING REPORT     STUDY DATE: 08/02/2021   PATIENT NAME: George Franco DOB: February 12, 1956 MRN: [id number]   EXAM: MRI Brain/IAC's with and without contrast   ORDERING CLINICIAN: Ihor Austin, NP CLINICAL HISTORY: 67 year old man with dizziness and imbalance COMPARISON FILMS: MRI 05/28/2019   TECHNIQUE: MRI of the brain with and  without contrast was obtained utilizing 5 mm axial slices with T1, T2, T2 flair, susceptibility weighted and diffusion weighted views.  T1 sagittal, T2 coronal and postcontrast views in the axial and coronal plane were obtained.  Additional thin section axial CISS and axial and coronal pre and postcontrast T1-weighted images through the internal auditory canals were performed. CONTRAST: 19 ml MultiHance IMAGING SITE: [facility], [address] [place], [place], [place]   FINDINGS: On sagittal images, the spinal cord is imaged caudally to C2-C3 and is normal in caliber.   The contents of the posterior fossa are of normal size and position.   The pituitary gland and optic chiasm appear normal.    Brain volume appears normal for age.  Mild cerebellar atrophy is noted, similar to the 2020 MRI.Marland Kitchen   The ventricles are normal in size and without distortion.  There are no abnormal extra-axial collections of fluid.     There are multiple T2/FLAIR hyperintense foci in the hemispheres with confluencies noted in the frontal lobes.  None of the foci enhanced or appear to be acute.  Multiple foci are noted within the pons and the right middle cerebellar peduncle.  Diffusion weighted images are normal.  Some foci of the hemispheres and pons have an appearance consistent with chronic lacunar infarctions.  None of the foci appear to be acute.  They do not enhance.  Compared to the MRI from 05/28/2019, more foci are noted within the pons as well as in the  hemispheres.  Susceptibility weighted images show a single chronic microhemorrhage in the right occipital lobe, not present on the previous MRI.     The VIIth/VIIIth nerve complex appears normal.  The mastoid air cells appear normal.  The orbits appear normal.   Mild mucoperiosteal thickening is noted in the maxillary sinuses.  The other paranasal sinuses appear normal.  Flow voids are identified within the major intracerebral arteries.      After the infusion of contrast  material, a normal enhancement pattern is noted.     IMPRESSION: This MRI of the brain with and without contrast with added attention to the internal auditory canal shows the following: 1.  Multiple T2/FLAIR hyperintense foci in the hemispheres and pons.  Many of the foci are nonspecific.  However, some of the foci have appearance consistent with chronic lacunar infarctions and other foci show an appearance consistent with chronic microvascular ischemic change.  Demyelination is not ruled out for some of the foci.  The extent has progressed compared to the 2020 MRI. 2.  Single chronic microhemorrhage in the right occipital lobe was not present on the 2020 MRI.   3.   Internal auditory canals appear normal. 4.   No acute findings.  Normal enhancement pattern.    Reviewed his MRi cervical spine: Patient'ss MRI of the cervical spine has multi-level moderate and severe stenosis with foraminal stenosis with compresive myelopathy. I think he needs evaluation for cervical decompression.At one level he has compressive myelopathy. I'd like to send him to Dr. Jake Samples and/or Dr. Blima Singer. Including them on this correspindence in case they feel differently but I think he needs neurosurgical evaluation and surgery. Please discuss with patent and I will place referral to our wonderful neurosurgical colleagues, thanks  As far as his brain,w e can address at a later time, this needs to be first see below   IMPRESSION: This MRI of the cervical spine without contrast shows the following: 1.   Increased T2 signal within the right hemicord adjacent to C5 and C6-C7, consistent with compressive myelopathic change. 2.   At C3-C4, there is mild spinal stenosis and moderate left foraminal narrowing though there does not appear to be nerve root compression. 3.   At C4-C5, there is moderate spinal stenosis and moderate to moderately severe bilateral foraminal narrowing with some potential for C5 nerve root compression to  either side. 4.   At C5-C6, there is moderately severe spinal stenosis and moderately severe bilateral foraminal narrowing with potential for C6 nerve root compression to either side. 5.   At C6-C7, there is moderately severe spinal stenosis, more to the right, and moderately severe left foraminal narrowing with potential for left C7 nerve root compression.  Patient complains of symptoms per HPI as well as the following symptoms: decline . Pertinent negatives and positives per HPI. All others negative  Update 07/06/2021 JM: Returns for 48-month follow-up unaccompanied   C/o imbalance - continued use of cane at all times. He did have a recent fall (tripped over a drop cord) thankfully without injury. No other recent falls. More so feels unsteady - is not worse specifically on uneven ground.  At first stated not new issue but then reported it was new - once asked for how long, he stated since the last time he was here 6 months ago. Denies any reoccurring dizziness or vertigo but then reported he will have dizziness with quick head movements which is not new. He has been increasing water intake.  Previously seen in office for chronic persistent worsening vertigo/dizziness by Dr. Lucia Gaskins - he has not yet completed recommend MR brain and cervical spine.  He was seen by ENT 02/01/2021 who recommended vestibular therapy for his continued disequilibrium which is likely microvascular issue.  Previously participated in vestibular therapy for only 1 session due to financial reasons - he did not do any additional therapy sessions since prior visit.   12/30/2020: patient here for dizziness, was referred to vestibular PT but only went once and declined any further appointments despite recommendations that this may help. He is back for follow up. He says vertigo has been terrible. It never stops. Feelings of motion and dizziness when standing or walking. If he looks up he gets dizzy, turn head either way he feels dizzy,  feels like motion when there is no motion, happens even when sitting.  Not recently to ENT, decreased hearing in the right ear. He has imbalance with walking as well.   Today, 09/30/2020, Mr. Sturdevant is being seen for acute visit. He was previously seen by Dr. Lucia Gaskins in 05/2019 for vertigo type episodes and stable since that time but reports 2-week onset of dizziness greatly interfering with ambulation. Evaluated by PCP who prescribed meclizine and advised to follow-up with our office for further evaluation.  Reports dizziness has been constant present upon awakening and worsening throughout the day.  He also reports worsening when turning head quickly from side to side or when he stands from sitting for prolonged period.  Denies symptoms while sitting.  He has been using a cane which he was using previously and denies any recent falls.  Use of meclizine without benefit.  He will occasionally have difficulty focusing with both eyes open but denies blurred or double vision.  He does admit to having a prescription for glasses but lost them and has not had recent follow-up with ophthalmology.  Denies weakness, numbness/tingling, speech or language difficulties or headache.  Towards the end of visit, he also reports diminished appetite and minimal to no fluid intake throughout the day  Recent lab work by PCP personally reviewed which was all satisfactory    History provided for reference purposes only Update 06/05/2019 Dr. Lucia Gaskins: today patient comes in for follow up, multiple lacunar strokes in the right pons, discussed lacunar strokes, causes, he smells heavily of cigarette smoke and we discussed smoking as a big risk factor, cholesterol, diabetes. We reviewed all the images togather, showed him the microvascular chronic changes. He needs close follow up and strict management of vascular risk factors. Will complete stroke workup with CTA head and echocardiogram.   Initial visit 05/13/2019 Dr. Lucia Gaskins:  George Franco is a 67 y.o. male here as requested by Adrian Prince, MD for vertigo.  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 Vertigo started several years ago, it is episodic, it hits him "all of a sudden", he feels dizzy and feels like he is going to fall. He is going to have another MRI 05/28/2019 ordered by Dr. Evlyn Kanner. Vertigo lasts all day, if he turn to the left the vertigo worsens, he says the episodes come and go and he started having the symptoms sine 8am. If he turns his head he can make the symptoms worse. He was shown the Epley Maneuvers by ENT. Never been to vestibular  therapy. No vomiting. No weakness or vision changes or any other focal deficit. Sitting still helps the vertigo. No inciting events, he has several episodes a year lasting 1-2 days. He sometimes has headaches, frontal, dull not migrainous. Lack of sleep worsens. Melatonin helps him sleep. No other focal neurologic deficits, associated symptoms, inciting events or modifiable factors.  Reviewed notes, labs and imaging from outside physicians, which showed: 03/25/2019: Ct w/wo showed No acute intracranial abnormalities including mass lesion or mass effect, hydrocephalus, extra-axial fluid collection, midline shift, hemorrhage, or acute infarction, large ischemic events (personally reviewed images)  Reviewed MRI brain report 11/2014: FINDINGS: Major intracranial vascular flow voids are stable. Cerebral volume is normal. No restricted diffusion to suggest acute infarction. No midline shift, mass effect, evidence of mass lesion, ventriculomegaly, extra-axial collection or acute intracranial hemorrhage. Cervicomedullary junction and pituitary are within normal limits. Negative visualized cervical spine.  Cbc normal, cmp with  elevated glucose otherwise unremarkable   Wallace Cullens and white matter signal throughout the brain appears within normal limits for age and not changed since 2007. No cortical encephalomalacia or chronic blood products identified.   Diminutive appearance of both IAC which is chronic. Other visible internal auditory structures appear grossly normal in the mastoids are clear.   Negative paranasal sinuses. Visualized orbit soft tissues are within normal limits. Normal bone marrow signal. Visualized scalp soft tissues are within normal limits.   IMPRESSION: 1. Stable and essentially normal for age noncontrast MRI appearance of the brain. 2. Chronic diminutive appearance of the bony internal auditory canals. Is there any evidence of vestibulocochlear neuropathy, or any other cranial neuropathies to suggest entrapment such as due to hyperostosis crani  alis interna?  I reviewed Dr. Rinaldo Cloud notes.  Patient presented for short-term disability and restrictions form to be filled out.  Dizziness had improved "a little bit" at last appointment.  He had been offered neurology referral in the past but declined but at the last appointment wanted to see neurology.  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  No vision changes.  I reviewed examination which showed slightly elevated blood pressure at 155/74, otherwise physical exam was normal, he did complain of vertigo, dizziness and gait instability.   Review of Systems: Patient complains of symptoms per HPI as well as the following symptoms: dizziness . Pertinent negatives and positives per HPI. All others negative    Social History   Socioeconomic History   Marital status: Married    Spouse name: Not on file   Number of children: 3   Years of education: Not on file    Highest education level: Not on file  Occupational History   Occupation: Location manager    Employer: [employer]  Tobacco Use   Smoking status: Every Day    Current packs/day: 0.50    Average packs/day: 0.5 packs/day for 20.0 years (10.0 ttl pk-yrs)    Types: Cigarettes   Smokeless tobacco: Never  Vaping Use   Vaping status: Never Used  Substance and Sexual Activity   Alcohol use: Not Currently    Alcohol/week: 2.0 standard drinks of alcohol    Types: 2 Shots of liquor per week   Drug use: No   Sexual activity: Yes    Partners: Female  Other Topics Concern   Not on file  Social History Narrative   Married, 2 sons one daughter   Retired    Caffeine: none    0-1 alcoholic drinks/week   He is a smoker   No drug use   Lives at home with wife   Social Determinants of Health   Financial Resource Strain: Not on file  Food Insecurity: Not on file  Transportation Needs: Not on file  Physical Activity: Not on file  Stress: Not on file  Social Connections: Not on file  Intimate Partner Violence: Not on file    Family History  Problem Relation Age of Onset   Prostate cancer Father    Diabetes Father    Diabetes Mother    Diabetes Brother    Colon cancer Neg Hx    Esophageal cancer Neg Hx    Liver cancer Neg Hx    Pancreatic cancer Neg Hx    Rectal cancer Neg Hx    Stomach cancer Neg Hx     Past Medical History:  Diagnosis Date   At risk for sleep apnea    STOP-BANG= 5     SENT TO PCP 06-02-2015   BPH (benign prostatic hyperplasia)    CTS (carpal tunnel syndrome)    CTS (carpal tunnel syndrome)    ED (erectile dysfunction) of organic origin    Hyperlipidemia    Hypertension    Hypogonadism in male    Knee pain    pt said he doesn't know anything about this   Lower urinary tract symptoms (LUTS)    LVH (left ventricular hypertrophy)    Persistent vertigo of central origin    Prostate cancer [facility]) urologist-  dr ottelin/  oncologist-  dr Kathrynn Running   Stage  T1c, Gleason 4+3, PSA 4.48,  vol 32.9cc--  External RXT complete 05-08-2015   Stroke (HCC)    Type 2 diabetes mellitus (HCC)    Vestibular neuropathy    Wears glasses     Patient Active Problem List   Diagnosis Date Noted   Acquired cerebral atrophy (HCC) 10/03/2022   Acute bilateral thoracic back pain 10/03/2022   History of prostate cancer 10/03/2022   History of stroke 10/03/2022   Intervertebral disc disorder of cervical region with myelopathy 10/03/2022   Lesion of bone of thoracic spine 10/03/2022   Major depressive disorder, recurrent, moderate (HCC) 10/03/2022   Cervical myelopathy (HCC) 11/02/2021   Asymmetric SNHL (sensorineural hearing loss) 02/01/2021   Disequilibrium 02/01/2021   Hardening of the aorta (main artery of the heart) (HCC) 07/12/2019   Lacunar stroke (HCC) 06/05/2019   BPPV (benign paroxysmal positional vertigo) 05/13/2019   Chronic kidney disease due to hypertension 03/29/2019   Abnormal CT scan of lung - RLL ground glass density 01/26/2018   Long term (current) use of insulin (HCC) 10/30/2017   Malignant neoplasm of prostate (HCC) 01/26/2015   Dizziness and giddiness 11/18/2014   Essential hypertension, benign 11/18/2014   Hyperlipidemia 08/24/2009    Past Surgical History:  Procedure Laterality Date   ANTERIOR CERVICAL DECOMP/DISCECTOMY FUSION N/A 11/02/2021   Procedure: Anterior Cervical Decompression Fusion, Cervical four-five, Cervical five-six, Cervical six-seven;  Surgeon: Bethann Goo, DO;  Location: [facility] OR;  Service: Neurosurgery;  Laterality: N/A;   COLONOSCOPY     IR ANGIOGRAM EXTREMITY RIGHT  03/05/2020   IR BONE MARROW BIOPSY & ASPIRATION  09/02/2022   IR BONE TUMOR(S)RF ABLATION  06/23/2022   IR FEM POP ART PTA MOD SED  03/05/2020   IR KYPHO THORACIC WITH BONE BIOPSY  06/23/2022  IR RADIOLOGIST EVAL & MGMT  02/26/2020   IR RADIOLOGIST EVAL & MGMT  04/15/2020   IR RADIOLOGIST EVAL & MGMT  07/22/2020   IR RADIOLOGIST EVAL & MGMT  05/24/2022    IR RADIOLOGIST EVAL & MGMT  07/07/2022   IR TIB-PERO ART ATHEREC INC PTA MOD SED  03/05/2020   IR US GUIDE VASC ACCESS RIGHT  03/05/2020   PROSTATE BIOPSY     RADIOACTIVE SEED IMPLANT N/A 06/05/2015   Procedure: RADIOACTIVE SEED IMPLANT/BRACHYTHERAPY IMPLANT;  Surgeon: Ihor Gully, MD;  Location: [facility] ;  Service: Urology;  Laterality: N/A;   ROTATOR CUFF REPAIR Right 2014   UMBILICAL HERNIA REPAIR  1990's   UPPER GASTROINTESTINAL ENDOSCOPY     wisdom teeth extrat      Current Outpatient Medications  Medication Sig Dispense Refill   aspirin EC 81 MG tablet Take 1 tablet (81 mg total) by mouth daily. 30 tablet 0   benazepril (LOTENSIN) 40 MG tablet Take 40 mg by mouth daily.     donepezil (ARICEPT) 10 MG tablet Take 5mg (1/2 pill) a pill at bedtime for 2 weeks and if no side effects increase to 10mg (whole pill) at bedtime for memory 90 tablet 3   ezetimibe (ZETIA) 10 MG tablet Take 10 mg by mouth daily.     FARXIGA 10 MG TABS tablet Take 10 mg by mouth daily.     insulin glargine (LANTUS SOLOSTAR) 100 UNIT/ML Solostar Pen Inject 22 units Subcutaneous daily and increase as directed in clinic (up to 50 units daily) dx E11.59     loperamide (IMODIUM) 2 MG capsule Take 2 mg by mouth every 6 (six) hours as needed for diarrhea or loose stools.  0   meclizine (ANTIVERT) 25 MG tablet Take 1 tablet (25 mg total) by mouth 3 (three) times daily as needed for dizziness. 30 tablet 2   methocarbamol (ROBAXIN) 500 MG tablet Take 1 tablet (500 mg total) by mouth 4 (four) times daily. 90 tablet 2   NOVOLOG FLEXPEN 100 UNIT/ML FlexPen Inject 22 Units into the skin 3 (three) times daily with meals.  6   ONETOUCH VERIO test strip USE TO TEST BLOOD SUGAR 3 TIMES A DAY PRIOR TO MEALS AND RECORD     rosuvastatin (CRESTOR) 20 MG tablet Take 20 mg by mouth at bedtime.     No current facility-administered medications for this visit.    Allergies as of 04/18/2023   (No Known Allergies)     Vitals: Today's Vitals   04/18/23 1120  BP: 123/69  Pulse: (!) 53  Weight: 169 lb (76.7 kg)  Height: 5\' 9"  (1.753 m)    Body mass index is 24.96 kg/m.   Physical exam: Exam: Gen: NAD, conversant                     CV: RRR, no MRG. No Carotid Bruits. No peripheral edema, warm, nontender Eyes: Conjunctivae clear without exudates or hemorrhage  Neuro: Detailed Neurologic Exam  Speech:    Speech is normal; fluent and spontaneous with normal comprehension.  Cognition:      01/12/2023    2:04 PM  MMSE - Mini Mental State Exam  Orientation to time 3  Orientation to Place 5  Registration 3  Attention/ Calculation 1  Recall 1  Language- name 2 objects 2  Language- repeat 1  Language- follow 3 step command 3  Language- read & follow direction 1  Write a sentence 1  Copy design 1  Total score 22     Cranial Nerves:    The pupils are equal, round, and reactive to light. Attempted, fundi too small to visualize. Visual fields are full to finger confrontation. Extraocular movements are intact. Trigeminal sensation is intact and the muscles of mastication are normal. The face is symmetric. The palate elevates in the midline. Hearing intact. Voice is normal. Shoulder shrug is normal. The tongue has normal motion without fasciculations.   Coordination: nml  Gait:   Wide based  Motor Observation:    No asymmetry, no atrophy, and no involuntary movements noted. Tone:    Normal muscle tone.    Posture:    Posture is normal. normal erect    Strength:    Strength is V/V in the upper and lower limbs.      Sensation: intact to LT     Reflex Exam:  DTR's:    Deep tendon reflexes in the upper and lower extremities are symmetrical  bilaterally.  Brisk uppers and lowers. Absent AJs.  Toes:    The toes are downgoing bilaterally.   Clonus:    Clonus is absent.   Assessment/Plan:  67 year old with chronic persistent worsening dizziness/vertigo.  Seen by [place] previously  multiple times back through 2020(carotid dopplers 06/2019 normal) with imaging showing evidence of multiple pontine infarcts on imaging likely incidental in setting of small vessel disease.George Franco is a 67 y.o. male here as requested by Adrian Prince, MD. 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 Last seen 06/26/2021.   We have seen patient for multiple symptoms including vertigo, cervical stenosis(s/p surgery),, strokes, imbalance. Had surgery for multiple cervical stenosis  wife provides most information. Here today for persistent dizziness, imbalance, cognitive decline. He still feels dizzy. He doesn't have a good appetite, he is drinking fluids, Imbalance likely multifactorial: we have sent to vestibular therapy, he has diabetic polyneuropathy, prior cervical stenosis sequelae, multiple strokes, mRI with white matter changes which can also cause cognitive changes as well as gait problems, Stable, no falls. Get up and has dizziness, also when turning head, constant dizziness. He saw Dr. Kieth Brightly and has likely vascular dementia and he suggests sleep test. He also has chronic low back pain which may ne affecting his mbalance an no one has ever MRIed his lower back.   Dr. Marvetta Gibbons notes:  I  suspect that the primary etiological factor creating progressive changes in cognitive functioning are related to significant microvascular ischemic changes and potential small lacunar infarctions.  This combined with significant pain conditions, dizziness and vertigo, sustained sleep disturbance, and peripheral neuropathy secondary to type 2 diabetes that is insulin-dependent are also having a contributing factor.  Other issues of concern of the possibility of an underlying obstructive sleep apnea.   I would recommend that the patient have assessment for possible obstructive sleep apnea.  While the level of cognitive deficits could not be explained by this factor alone it is  something that given his issues with hypertension, indications of microvascular ischemic changes/small vessel disease, memory and cognitive deficits would suggest that this assessment would be warranted with the patient having multiple risk factors for obstructive sleep apnea.  I will sit down with the patient and his wife and go over the results of the current neuropsychological evaluation.   Wife is concerned for his memory. MMSE 22/30. He has been diagnosed with vascular dementia by formal memory testing.   Worsening dizziness and cognitive decline, dementia:  MRI of the brain w/wo contrast Sleep study: Formal neurocognitive testing indicated need for sleep evaluation Start Aricept/ Donepezil start 5mg  at bedtime and then increase to 10mg  at bedtime. 2. Imbalance, gait abnormality: MRI lumbar spine   No orders of the defined types were placed in this encounter.  No orders of the defined types were placed in this encounter.  Prior assessments 2022  Prior MRI: MRI of the cervical spine has multi-level moderate and severe stenosis with foraminal stenosis with compresive myelopathy. I think he needs evaluation for cervical decompression.At one level he has compressive myelopathy. I'd like to send him to Dr. Jake Samples and/or Dr.  Blima Singer. Including them on this correspindence in case they feel differently but I think he needs neurosurgical evaluation and surgery. Please discuss with patent and I will place referral to our wonderful neurosurgical colleagues, thanks  As far as his brain,w e can address at a later time, this needs to be first see below  Suspect not drinking enough fluids, encouraged increase in fluids.   Difficulty with full history although likely multifactorial as he reports constant dizziness worsening throughout the day but also reports increased dizziness with position changes or head movements. In the past denied dizziness at rest but today states is continuous even with sitting  Hx of BPPV with some features consistent with BPPV therefore recommended vestibular rehab as well as ongoing use of meclizine per PCP and trial of Zofran as needed.  Advised use of cane at all times for fall prevention. He went to one vestibular PT session and declined any further because of insurance per PT notes.  Will repeat MRI brain and cervical spine with thin cuts through the IAC.  Send to ENT for decreases hearing in right ear and recheck. If no other etiology then can  consider PCP send to the [facility] and we will return patient back to pcp for dizziness but we can continue to follow for strokes.   He does have prior stroke history, diabetic polyneuropathy with chronic gait impairment and imbalance but will check MRI cervical spine  Discussed importance of adequate water intake as well as nutrition as this could be contributing to "constant" dizziness complaint.  Advised him to further follow-up with PCP for further evaluation in this aspect  Follow-up 6 months with Shanda Bumps for strokes  Personally examined patient and images,  personally obtained the history, evaluated lab date, reviewed imaging studies and agree with radiology interpretations.    Naomie Dean, MD  I spent over 55 minutes of  face-to-face and non-face-to-face time with patient on the  No diagnosis found.  diagnosis.  This included previsit chart review, lab review, study review, order entry, electronic health record documentation, patient education on the different diagnostic and therapeutic options, counseling and coordination of care, risks and benefits of management, compliance, or risk factor reduction

## 2023-04-18 NOTE — Patient Instructions (Addendum)
Sleep study - consult first with Dr. Frances Furbish or Dohmeier then sleep study Swallow study - [place] for the study DAT Scan - I will order - you will be called ENT for evaluation of swallow - I will order you will be called Scan of the neck to look at esophagus if we can't get an ENT appointment soon Pulse is low, stop the 10mg  of Aricept/Donepezil and I will send in a lower dose When done will call for follow up  Multiple System Atrophy Multiple system atrophy (MSA) is a rare disease of the brain and spinal cord (central nervous system) that causes a progressive loss of nerve cells. This condition used to be called Shy-Drager syndrome. Multiple system atrophy affects one or both of the following areas of the central nervous system: Autonomic nervous system, which controls functions such as blood pressure, heart rate, and bladder function. Motor system, which controls balance and muscle movements. Symptoms tend to get worse over time. There is no cure, but treatment may help control some of the symptoms. What are the causes? The cause of MSA is not known. What are the signs or symptoms? Symptoms of this condition depend on which part of your nervous system is affected. You may have symptoms that mainly affect either your autonomic or motor system, or you might have a combination of symptoms that affect both. Symptoms of autonomic nervous system disease include: Low blood pressure when standing (orthostatic hypotension). This can cause dizziness or fainting. Inability to control when you urinate (incontinence). Constipation. Difficulty getting an erection (erectile dysfunction). Dry mouth and skin. Irregular heartbeat. Symptoms of motor system disease include: Rigid muscles. Slowed movement. Poor balance or clumsiness. Trembling or shaking that you cannot control (tremors). Trouble speaking. Double vision. Trouble chewing and swallowing. Trouble breathing. How is this diagnosed? At  first, MSA can seem like other nervous system disorders. Some of the symptoms are similar to Parkinson's disease. Because of this, it may take a long time to get a diagnosis of MSA. There is no single test that can be used to diagnose this condition. You may be referred to a specialist in nervous system diseases (neurologist). A neurologist may diagnose MSA by doing a physical and neurological exam and by evaluating your symptoms that get worse over time. Imaging studies of the brain may also be done, including: MRI. PET scan. DaTscan. This is a test that uses a radioactive substance to see specific cells in the brain. How is this treated? No treatments can cure or slow the progression of MSA. The goal of treatment is to manage symptoms. Treatment may include: Medicine to improve symptoms that affect your motor system. Medicine to support blood pressure control. Medicine to relieve constipation and control incontinence. A feeding tube if swallowing becomes difficult. Physical therapy to keep muscles healthy for as long as possible. A walker, wheelchair, or other assistive devices if walking becomes difficult. Follow these instructions at home: Eating and drinking  Increase the amount of salt in your diet as directed by your health care provider. This can help prevent orthostatic hypotension. You may need to take these actions to prevent or treat constipation: Take over-the-counter or prescription medicines. Eat foods that are high in fiber, such as beans, whole grains, and fresh fruits and vegetables. Limit foods that are high in fat and processed sugars, such as fried or sweet foods. Drink more fluid if your health care provider recommends. This will help with both constipation and low blood pressure. Do not  drink alcohol. Lifestyle  Sleep with the head of your bed slightly raised. This may reduce orthostatic hypotension in the morning. Talk with your health care provider about using  compression stockings. This might help with your blood pressure. Stay as active as you can. Ask your health care provider to recommend a safe level of exercise for you. Do not use any products that contain nicotine or tobacco. These products include cigarettes, chewing tobacco, and vaping devices, such as e-cigarettes. If you need help quitting, ask your health care provider. General instructions Work closely with your health care providers. MSA is a progressive disease that will get worse over time. Take over-the-counter and prescription medicines only as told by your health care provider. Take steps to reduce the risk of falls at home, such as: Removing throw rugs and other tripping hazards from the floor. Installing grab bars by the toilet and in the tub and shower. Make sure that you have a good support system at home. Ask about home care assistance if needed. Ask your health care provider about receiving palliative care to help manage symptoms and support your family members and loved ones. Keep all follow-up visits. This is important. Where to find more information [employer] of Neurological Disorders and Stroke: [employer] Contact a health care provider if: Your symptoms change or get worse. You need more support at home. Get help right away if: You have an injury from a fainting spell. You choke when you try to swallow food or fluids. You have trouble breathing. You have chest pain. These symptoms may represent a serious problem that is an emergency. Do not wait to see if the symptoms will go away. Get medical help right away. Call your local emergency services (911 in the [place] Do not drive yourself to the hospital. Summary Multiple system atrophy is a rare disease of the brain and spinal cord (central nervous system). It causes a loss of nerve cells. Symptoms of this condition depend on which part of your nervous system is involved. You may have symptoms that mainly  affect either your autonomic or motor system, or you might have a combination of symptoms that affect both. Treatment depends on your symptoms. It may include medicines to support blood pressure, medicines for urinary incontinence or constipation, and physical therapy to help keep muscles strong. Follow your health care provider's instructions about taking medicines, eating specific foods, preventing falls at home, and when to get medical help. This information is not intended to replace advice given to you by your health care provider. Make sure you discuss any questions you have with your health care provider. Document Revised: 11/25/2020 Document Reviewed: 11/25/2020 Elsevier Patient Education  2024 [employer].

## 2023-04-19 MED ORDER — DONEPEZIL HCL 5 MG PO TABS: 5.0000 mg | ORAL_TABLET | Freq: Every day | ORAL | 3 refills | Status: AC

## 2023-04-20 ENCOUNTER — Telehealth (HOSPITAL_COMMUNITY): Payer: Self-pay | Admitting: *Deleted

## 2023-04-20 NOTE — Telephone Encounter (Signed)
Attempted to contact patient to schedule OP MBS. Left VM. RKEEL 

## 2023-04-21 ENCOUNTER — Telehealth: Payer: Self-pay | Admitting: Neurology

## 2023-04-21 NOTE — Telephone Encounter (Signed)
Referral for ENT fax to [facility]. Phone: [phone], Fax: [phone].

## 2023-04-25 ENCOUNTER — Telehealth: Payer: Self-pay | Admitting: Neurology

## 2023-04-25 NOTE — Telephone Encounter (Signed)
no auth required sent to [facility] nuclear medicine [phone]

## 2023-05-09 ENCOUNTER — Encounter (HOSPITAL_COMMUNITY): Payer: Medicare HMO

## 2023-05-10 ENCOUNTER — Ambulatory Visit (INDEPENDENT_AMBULATORY_CARE_PROVIDER_SITE_OTHER): Payer: Medicare HMO | Admitting: Podiatry

## 2023-05-10 DIAGNOSIS — Z91199 Patient's noncompliance with other medical treatment and regimen due to unspecified reason: Secondary | ICD-10-CM

## 2023-05-10 NOTE — Progress Notes (Signed)
1. No-show for appointment     

## 2023-05-12 ENCOUNTER — Other Ambulatory Visit (HOSPITAL_COMMUNITY): Payer: Medicare HMO

## 2023-05-12 ENCOUNTER — Other Ambulatory Visit: Payer: Medicare HMO

## 2023-05-12 ENCOUNTER — Encounter (HOSPITAL_COMMUNITY): Payer: Medicare HMO

## 2023-05-19 ENCOUNTER — Encounter (HOSPITAL_COMMUNITY)
Admission: RE | Admit: 2023-05-19 | Discharge: 2023-05-19 | Disposition: A | Discharge status: Home / Self Care | Payer: Medicare HMO | Source: Ambulatory Visit | Attending: Neurology | Admitting: Neurology

## 2023-05-19 DIAGNOSIS — R1310 Dysphagia, unspecified: Secondary | ICD-10-CM | POA: Diagnosis not present

## 2023-05-19 DIAGNOSIS — R634 Abnormal weight loss: Secondary | ICD-10-CM | POA: Diagnosis not present

## 2023-05-19 DIAGNOSIS — G319 Degenerative disease of nervous system, unspecified: Secondary | ICD-10-CM | POA: Insufficient documentation

## 2023-05-19 DIAGNOSIS — G238 Other specified degenerative diseases of basal ganglia: Secondary | ICD-10-CM | POA: Diagnosis not present

## 2023-05-19 DIAGNOSIS — G20C Parkinsonism, unspecified: Secondary | ICD-10-CM | POA: Diagnosis not present

## 2023-05-19 MED ORDER — POTASSIUM IODIDE (ANTIDOTE) 130 MG PO TABS
ORAL_TABLET | ORAL | Status: AC
  Filled 2023-05-19: qty 1

## 2023-05-19 MED ORDER — IOFLUPANE I 123 185 MBQ/2.5ML IV SOLN
4.4000 | Freq: Once | INTRAVENOUS | Status: AC | PRN
  Administered 2023-05-19: 4.4 via INTRAVENOUS

## 2023-06-12 DIAGNOSIS — R1310 Dysphagia, unspecified: Secondary | ICD-10-CM | POA: Diagnosis not present

## 2023-06-16 ENCOUNTER — Ambulatory Visit (HOSPITAL_COMMUNITY)
Admission: RE | Admit: 2023-06-16 | Discharge: 2023-06-16 | Disposition: A | Discharge status: Home / Self Care | Payer: Medicare HMO | Source: Ambulatory Visit | Attending: Endocrinology | Admitting: Endocrinology

## 2023-06-16 DIAGNOSIS — G319 Degenerative disease of nervous system, unspecified: Secondary | ICD-10-CM | POA: Insufficient documentation

## 2023-06-16 DIAGNOSIS — G238 Other specified degenerative diseases of basal ganglia: Secondary | ICD-10-CM | POA: Insufficient documentation

## 2023-06-16 DIAGNOSIS — R634 Abnormal weight loss: Secondary | ICD-10-CM | POA: Diagnosis not present

## 2023-06-16 DIAGNOSIS — H9190 Unspecified hearing loss, unspecified ear: Secondary | ICD-10-CM | POA: Insufficient documentation

## 2023-06-16 DIAGNOSIS — H8110 Benign paroxysmal vertigo, unspecified ear: Secondary | ICD-10-CM | POA: Diagnosis not present

## 2023-06-16 DIAGNOSIS — R1310 Dysphagia, unspecified: Secondary | ICD-10-CM

## 2023-06-16 DIAGNOSIS — E1122 Type 2 diabetes mellitus with diabetic chronic kidney disease: Secondary | ICD-10-CM | POA: Insufficient documentation

## 2023-06-16 DIAGNOSIS — F329 Major depressive disorder, single episode, unspecified: Secondary | ICD-10-CM | POA: Diagnosis not present

## 2023-06-16 DIAGNOSIS — Z8673 Personal history of transient ischemic attack (TIA), and cerebral infarction without residual deficits: Secondary | ICD-10-CM | POA: Diagnosis not present

## 2023-06-16 DIAGNOSIS — M5000 Cervical disc disorder with myelopathy, unspecified cervical region: Secondary | ICD-10-CM | POA: Diagnosis not present

## 2023-06-16 DIAGNOSIS — N189 Chronic kidney disease, unspecified: Secondary | ICD-10-CM | POA: Diagnosis not present

## 2023-06-16 DIAGNOSIS — Z8546 Personal history of malignant neoplasm of prostate: Secondary | ICD-10-CM | POA: Diagnosis not present

## 2023-07-14 DIAGNOSIS — F331 Major depressive disorder, recurrent, moderate: Secondary | ICD-10-CM | POA: Diagnosis not present

## 2023-07-14 DIAGNOSIS — N182 Chronic kidney disease, stage 2 (mild): Secondary | ICD-10-CM | POA: Diagnosis not present

## 2023-07-14 DIAGNOSIS — F01C0 Vascular dementia, severe, without behavioral disturbance, psychotic disturbance, mood disturbance, and anxiety: Secondary | ICD-10-CM | POA: Diagnosis not present

## 2023-07-14 DIAGNOSIS — D472 Monoclonal gammopathy: Secondary | ICD-10-CM | POA: Diagnosis not present

## 2023-07-14 DIAGNOSIS — I700 Atherosclerosis of aorta: Secondary | ICD-10-CM | POA: Diagnosis not present

## 2023-07-14 DIAGNOSIS — E1159 Type 2 diabetes mellitus with other circulatory complications: Secondary | ICD-10-CM | POA: Diagnosis not present

## 2023-07-14 DIAGNOSIS — Z794 Long term (current) use of insulin: Secondary | ICD-10-CM | POA: Diagnosis not present

## 2023-07-14 DIAGNOSIS — I129 Hypertensive chronic kidney disease with stage 1 through stage 4 chronic kidney disease, or unspecified chronic kidney disease: Secondary | ICD-10-CM | POA: Diagnosis not present

## 2023-07-20 ENCOUNTER — Ambulatory Visit: Payer: Medicare HMO | Admitting: Adult Health

## 2023-08-24 ENCOUNTER — Encounter: Payer: Self-pay | Admitting: Neurology

## 2023-08-24 ENCOUNTER — Telehealth: Payer: Self-pay | Admitting: Neurology

## 2023-08-24 ENCOUNTER — Ambulatory Visit: Payer: Medicare HMO | Admitting: Neurology

## 2023-08-24 NOTE — Telephone Encounter (Signed)
Pt called to r/s his 60 min 3 mo f/u

## 2023-08-28 ENCOUNTER — Telehealth: Payer: Self-pay | Admitting: Neurology

## 2023-08-28 NOTE — Telephone Encounter (Signed)
Pt's wife states he is becoming more forgetful and she is concerned. States pt canceled last appt on his own and she was unaware until after it was already done. Wanting to know if that was a necessary appt. Requesting call back to discuss

## 2023-08-29 NOTE — Telephone Encounter (Signed)
I called and LMVM for wife of pt that was returning call.

## 2023-08-30 ENCOUNTER — Telehealth: Payer: Self-pay | Admitting: Hematology and Oncology

## 2023-08-30 ENCOUNTER — Other Ambulatory Visit: Payer: Self-pay | Admitting: Hematology and Oncology

## 2023-08-30 ENCOUNTER — Inpatient Hospital Stay: Payer: Medicare HMO

## 2023-08-30 DIAGNOSIS — D472 Monoclonal gammopathy: Secondary | ICD-10-CM

## 2023-08-30 NOTE — Telephone Encounter (Signed)
Wife has called to r/s pt's appointment and to return call to RN

## 2023-08-30 NOTE — Telephone Encounter (Addendum)
Wife called back. I spoke with her. Pt hit the cancellation button for appt and so 08-24-2023 appt cancelled.  She was wondering whether his testing that he had done, warranted a sooner appt.  He now has 02-07-2024 1400 1 hr long appt scheduled (due to the cancellation and next availability).  DAT scan, ENT ref / MBS.done. Wife states he is progressively getting worse relating to memory.Marland Kitchen  last seen 04-18-2023, now 02-07-2024.

## 2023-08-31 NOTE — Telephone Encounter (Signed)
Unless an NP has a cancellation, we are booking out. We can pu thim on a wait list

## 2023-08-31 NOTE — Telephone Encounter (Signed)
Probably more appropriate for MD

## 2023-09-01 ENCOUNTER — Inpatient Hospital Stay: Payer: Medicare HMO | Attending: Hematology and Oncology

## 2023-09-01 DIAGNOSIS — F17210 Nicotine dependence, cigarettes, uncomplicated: Secondary | ICD-10-CM | POA: Insufficient documentation

## 2023-09-01 DIAGNOSIS — D472 Monoclonal gammopathy: Secondary | ICD-10-CM | POA: Insufficient documentation

## 2023-09-01 DIAGNOSIS — M4854XA Collapsed vertebra, not elsewhere classified, thoracic region, initial encounter for fracture: Secondary | ICD-10-CM | POA: Diagnosis not present

## 2023-09-01 DIAGNOSIS — Z8042 Family history of malignant neoplasm of prostate: Secondary | ICD-10-CM | POA: Diagnosis not present

## 2023-09-01 LAB — CMP (CANCER CENTER ONLY)
ALT: 14 U/L (ref 0–44)
AST: 24 U/L (ref 15–41)
Albumin: 4.1 g/dL (ref 3.5–5.0)
Alkaline Phosphatase: 79 U/L (ref 38–126)
Anion gap: 13 (ref 5–15)
BUN: 8 mg/dL (ref 8–23)
CO2: 26 mmol/L (ref 22–32)
Calcium: 9.4 mg/dL (ref 8.9–10.3)
Chloride: 96 mmol/L — ABNORMAL LOW (ref 98–111)
Creatinine: 0.69 mg/dL (ref 0.61–1.24)
GFR, Estimated: >60 mL/min (ref >60)
Glucose, Bld: 162 mg/dL — ABNORMAL HIGH (ref 70–99)
Potassium: 3.6 mmol/L (ref 3.5–5.1)
Sodium: 135 mmol/L (ref 135–145)
Total Bilirubin: 1.1 mg/dL (ref 0.0–1.2)
Total Protein: 7.5 g/dL (ref 6.5–8.1)

## 2023-09-01 LAB — CBC WITH DIFFERENTIAL (CANCER CENTER ONLY)
Abs Immature Granulocytes: 0.02 10*3/uL (ref 0.00–0.07)
Basophils Absolute: 0 10*3/uL (ref 0.0–0.1)
Basophils Relative: 1 %
Eosinophils Absolute: 0.1 10*3/uL (ref 0.0–0.5)
Eosinophils Relative: 1 %
HCT: 33.9 % — ABNORMAL LOW (ref 39.0–52.0)
Hemoglobin: 11 g/dL — ABNORMAL LOW (ref 13.0–17.0)
Immature Granulocytes: 0 %
Lymphocytes Relative: 22 %
Lymphs Abs: 1.4 10*3/uL (ref 0.7–4.0)
MCH: 31.2 pg (ref 26.0–34.0)
MCHC: 32.4 g/dL (ref 30.0–36.0)
MCV: 96 fL (ref 80.0–100.0)
Monocytes Absolute: 0.7 10*3/uL (ref 0.1–1.0)
Monocytes Relative: 11 %
Neutro Abs: 4.2 10*3/uL (ref 1.7–7.7)
Neutrophils Relative %: 65 %
Platelet Count: 169 10*3/uL (ref 150–400)
RBC: 3.53 MIL/uL — ABNORMAL LOW (ref 4.22–5.81)
RDW: 14.6 % (ref 11.5–15.5)
WBC Count: 6.4 10*3/uL (ref 4.0–10.5)
nRBC: 0 % (ref 0.0–0.2)

## 2023-09-01 LAB — LACTATE DEHYDROGENASE: LDH: 192 U/L (ref 98–192)

## 2023-09-01 NOTE — Telephone Encounter (Signed)
Noted

## 2023-09-01 NOTE — Telephone Encounter (Signed)
Wife returned call. She stated that she would like to be considered for afternoon appts. Pt verbalized appreciation.

## 2023-09-01 NOTE — Telephone Encounter (Signed)
I called and LMVM for wife.  Will place on cancellation list and call if sooner availability. Wife is to call back if questions.

## 2023-09-04 LAB — KAPPA/LAMBDA LIGHT CHAINS
Kappa free light chain: 27.4 mg/L — ABNORMAL HIGH (ref 3.3–19.4)
Kappa, lambda light chain ratio: 2.4 — ABNORMAL HIGH (ref 0.26–1.65)
Lambda free light chains: 11.4 mg/L (ref 5.7–26.3)

## 2023-09-05 LAB — MULTIPLE MYELOMA PANEL, SERUM
Albumin SerPl Elph-Mcnc: 3.6 g/dL (ref 2.9–4.4)
Albumin/Glob SerPl: 1.2 (ref 0.7–1.7)
Alpha 1: 0.3 g/dL (ref 0.0–0.4)
Alpha2 Glob SerPl Elph-Mcnc: 0.9 g/dL (ref 0.4–1.0)
B-Globulin SerPl Elph-Mcnc: 0.9 g/dL (ref 0.7–1.3)
Gamma Glob SerPl Elph-Mcnc: 1.1 g/dL (ref 0.4–1.8)
Globulin, Total: 3.2 g/dL (ref 2.2–3.9)
IgA: 374 mg/dL (ref 61–437)
IgG (Immunoglobin G), Serum: 1035 mg/dL (ref 603–1613)
IgM (Immunoglobulin M), Srm: 53 mg/dL (ref 20–172)
M Protein SerPl Elph-Mcnc: 0.3 g/dL — ABNORMAL HIGH
Total Protein ELP: 6.8 g/dL (ref 6.0–8.5)

## 2023-09-05 NOTE — Progress Notes (Unsigned)
[facility] [phone]   [phone]  PROGRESS NOTE  Patient Care Team: Adrian Prince, MD as PCP - General (Endocrinology)  Hematological/Oncological History # IgG Kappa Monoclonal Gammopathy  # T6 Compression Fracture  04/15/2022: M protein 0.3, IFE shows IgG Kappa monoclonal protein 06/23/2022: T6 compression fracture. Underwent kyphoplasty and biopsy, negative for carcinoma.  07/29/2022: establish care with Dr. Leonides Schanz  09/02/2022: Bone marrow biopsy revealed normocellular bone marrow (40%) with trilineage hematopoiesis, and approximately 5% plasma cells by CD138 immunohistochemical analysis   Interval History:  George Franco 68 y.o. male with medical history significant for IgG kappa MGUS who presents for a follow up visit. The patient's last visit was on 03/06/2023. In the interim since the last visit he underwent a bone marrow biopsy which confirmed he has MGUS.  On exam today George Franco reports he has been doing "pretty good" in the interim since her last visit.  He reports he is had no major changes in his energy.  He has been having some trouble with appetite and difficulty sleeping.  He reports that at night he is "restless".  He notes that his energy levels despite this have been okay.  He reports that he is not having any lightheadedness, dizziness, or shortness of breath.  He reports that he did have a fall recently where he tripped and fell "on my head".  He notes that he did not visit the hospital but has not been having any issues with headache, vision changes, or facial pain.  He reports that he does continue to smoke about half a pack per day.  He reports his appetite is quite poor and he has not "had anything to eat since Monday".  He reports he simply does not have a desire to eat.  Sometimes he will chew up his food and just spit it out.  He reports he is had no infectious symptoms such as runny nose, sore throat.  He otherwise denies any  fevers, chills, sweats, nausea, vomiting or diarrhea.  A full 10 point ROS is otherwise negative.  MEDICAL HISTORY:  Past Medical History:  Diagnosis Date   At risk for sleep apnea    STOP-BANG= 5     SENT TO PCP 06-02-2015   BPH (benign prostatic hyperplasia)    CTS (carpal tunnel syndrome)    CTS (carpal tunnel syndrome)    ED (erectile dysfunction) of organic origin    Hyperlipidemia    Hypertension    Hypogonadism in male    Knee pain    pt said he doesn't know anything about this   Lower urinary tract symptoms (LUTS)    LVH (left ventricular hypertrophy)    Persistent vertigo of central origin    Prostate cancer [facility]) urologist-  dr ottelin/  oncologist-  dr Kathrynn Running   Stage T1c, Gleason 4+3, PSA 4.48,  vol 32.9cc--  External RXT complete 05-08-2015   Stroke (HCC)    Type 2 diabetes mellitus (HCC)    Vestibular neuropathy    Wears glasses     SURGICAL HISTORY: Past Surgical History:  Procedure Laterality Date   ANTERIOR CERVICAL DECOMP/DISCECTOMY FUSION N/A 11/02/2021   Procedure: Anterior Cervical Decompression Fusion, Cervical four-five, Cervical five-six, Cervical six-seven;  Surgeon: Dawley, Alan Mulder, DO;  Location: [facility] OR;  Service: Neurosurgery;  Laterality: N/A;   COLONOSCOPY     IR ANGIOGRAM EXTREMITY RIGHT  03/05/2020   IR BONE MARROW BIOPSY & ASPIRATION  09/02/2022   IR BONE TUMOR(S)RF ABLATION  06/23/2022   IR FEM POP ART PTA MOD SED  03/05/2020   IR KYPHO THORACIC WITH BONE BIOPSY  06/23/2022   IR RADIOLOGIST EVAL & MGMT  02/26/2020   IR RADIOLOGIST EVAL & MGMT  04/15/2020   IR RADIOLOGIST EVAL & MGMT  07/22/2020   IR RADIOLOGIST EVAL & MGMT  05/24/2022   IR RADIOLOGIST EVAL & MGMT  07/07/2022   IR TIB-PERO ART ATHEREC INC PTA MOD SED  03/05/2020   IR US GUIDE VASC ACCESS RIGHT  03/05/2020   PROSTATE BIOPSY     RADIOACTIVE SEED IMPLANT N/A 06/05/2015   Procedure: RADIOACTIVE SEED IMPLANT/BRACHYTHERAPY IMPLANT;  Surgeon: Ihor Gully, MD;  Location: [facility];  Service: Urology;  Laterality: N/A;   ROTATOR CUFF REPAIR Right 2014   UMBILICAL HERNIA REPAIR  1990's   UPPER GASTROINTESTINAL ENDOSCOPY     wisdom teeth extrat      SOCIAL HISTORY: Social History   Socioeconomic History   Marital status: Married    Spouse name: Not on file   Number of children: 3   Years of education: Not on file   Highest education level: Not on file  Occupational History   Occupation: Location manager    Employer: [employer]  Tobacco Use   Smoking status: Every Day    Current packs/day: 0.50    Average packs/day: 0.5 packs/day for 20.0 years (10.0 ttl pk-yrs)    Types: Cigarettes   Smokeless tobacco: Never  Vaping Use   Vaping status: Never Used  Substance and Sexual Activity   Alcohol use: Not Currently    Alcohol/week: 2.0 standard drinks of alcohol    Types: 2 Shots of liquor per week   Drug use: No   Sexual activity: Yes    Partners: Female  Other Topics Concern   Not on file  Social History Narrative   Married, 2 sons one daughter   Retired    Caffeine: none    0-1 alcoholic drinks/week   He is a smoker   No drug use   Lives at home with wife   Social Drivers of Corporate investment banker Strain: Not on file  Food Insecurity: Not on file  Transportation Needs: Not on file  Physical Activity: Not on file  Stress: Not on file  Social Connections: Not on file  Intimate Partner Violence: Not on file    FAMILY HISTORY: Family History  Problem Relation Age of Onset   Prostate cancer Father    Diabetes Father    Diabetes Mother    Diabetes Brother    Colon cancer Neg Hx    Esophageal cancer Neg Hx    Liver cancer Neg Hx    Pancreatic cancer Neg Hx    Rectal cancer Neg Hx    Stomach cancer Neg Hx     ALLERGIES:  has no known allergies.  MEDICATIONS:  Current Outpatient Medications  Medication Sig Dispense Refill   aspirin EC 81 MG tablet Take 1 tablet (81 mg total) by mouth daily. 30 tablet 0    benazepril (LOTENSIN) 40 MG tablet Take 40 mg by mouth daily.     donepezil (ARICEPT) 5 MG tablet Take 1 tablet (5 mg total) by mouth at bedtime. 30 tablet 3   ezetimibe (ZETIA) 10 MG tablet Take 10 mg by mouth daily.     FARXIGA 10 MG TABS tablet Take 10 mg by mouth daily.     insulin glargine (LANTUS SOLOSTAR) 100 UNIT/ML Solostar Pen  Inject 22 units Subcutaneous daily and increase as directed in clinic (up to 50 units daily) dx E11.59     loperamide (IMODIUM) 2 MG capsule Take 2 mg by mouth every 6 (six) hours as needed for diarrhea or loose stools.  0   meclizine (ANTIVERT) 25 MG tablet Take 1 tablet (25 mg total) by mouth 3 (three) times daily as needed for dizziness. 30 tablet 2   methocarbamol (ROBAXIN) 500 MG tablet Take 1 tablet (500 mg total) by mouth 4 (four) times daily. 90 tablet 2   NOVOLOG FLEXPEN 100 UNIT/ML FlexPen Inject 22 Units into the skin 3 (three) times daily with meals.  6   ONETOUCH VERIO test strip USE TO TEST BLOOD SUGAR 3 TIMES A DAY PRIOR TO MEALS AND RECORD     rosuvastatin (CRESTOR) 20 MG tablet Take 20 mg by mouth at bedtime.     No current facility-administered medications for this visit.    REVIEW OF SYSTEMS:   Constitutional: ( - ) fevers, ( - )  chills , ( - ) night sweats Eyes: ( - ) blurriness of vision, ( - ) double vision, ( - ) watery eyes Ears, nose, mouth, throat, and face: ( - ) mucositis, ( - ) sore throat Respiratory: ( - ) cough, ( - ) dyspnea, ( - ) wheezes Cardiovascular: ( - ) palpitation, ( - ) chest discomfort, ( - ) lower extremity swelling Gastrointestinal:  ( - ) nausea, ( - ) heartburn, ( - ) change in bowel habits Skin: ( - ) abnormal skin rashes Lymphatics: ( - ) new lymphadenopathy, ( - ) easy bruising Neurological: ( - ) numbness, ( - ) tingling, ( - ) new weaknesses Behavioral/Psych: ( - ) mood change, ( - ) new changes  All other systems were reviewed with the patient and are negative.  PHYSICAL EXAMINATION: ECOG PERFORMANCE  STATUS: 0 - Asymptomatic  Vitals:   09/06/23 1418  BP: (!) 156/78  Pulse: 88  Resp: 16  Temp: 98.2 F (36.8 C)  SpO2: 100%   Filed Weights   09/06/23 1418  Weight: 158 lb (71.7 kg)     GENERAL: Well-appearing elderly African-American male, alert, no distress and comfortable SKIN: skin color, texture, turgor are normal, no rashes or significant lesions EYES: conjunctiva are pink and non-injected, sclera clear LUNGS: clear to auscultation and percussion with normal breathing effort HEART: regular rate & rhythm and no murmurs and no lower extremity edema Musculoskeletal: no cyanosis of digits and no clubbing  PSYCH: alert & oriented x 3, fluent speech NEURO: no focal motor/sensory deficits  LABORATORY DATA:  I have reviewed the data as listed    Latest Ref Rng & Units 09/01/2023    2:01 PM 03/06/2023    3:20 PM 09/02/2022    8:57 AM  CBC  WBC 4.0 - 10.5 K/uL 6.4  6.0  5.6   Hemoglobin 13.0 - 17.0 g/dL 16.1  09.6  04.5   Hematocrit 39.0 - 52.0 % 33.9  36.9  38.2   Platelets 150 - 400 K/uL 169  190  211        Latest Ref Rng & Units 09/01/2023    2:01 PM 03/06/2023    3:20 PM 07/29/2022    2:19 PM  CMP  Glucose 70 - 99 mg/dL 409  811  82   BUN 8 - 23 mg/dL 8  7  14    Creatinine 0.61 - 1.24 mg/dL 9.14  7.82  9.56  Sodium 135 - 145 mmol/L 135  139  140   Potassium 3.5 - 5.1 mmol/L 3.6  4.1  3.9   Chloride 98 - 111 mmol/L 96  104  102   CO2 22 - 32 mmol/L 26  26  30    Calcium 8.9 - 10.3 mg/dL 9.4  9.7  16.1   Total Protein 6.5 - 8.1 g/dL 7.5  6.8  8.3   Total Bilirubin 0.0 - 1.2 mg/dL 1.1  0.5  0.9   Alkaline Phos 38 - 126 U/L 79  68  84   AST 15 - 41 U/L 24  33  45   ALT 0 - 44 U/L 14  27  55     Lab Results  Component Value Date   MPROTEIN 0.3 (H) 09/01/2023   MPROTEIN 0.3 (H) 03/06/2023   MPROTEIN 0.3 (H) 07/29/2022   Lab Results  Component Value Date   KPAFRELGTCHN 27.4 (H) 09/01/2023   KPAFRELGTCHN 37.4 (H) 03/06/2023   KPAFRELGTCHN 39.0 (H) 07/29/2022    LAMBDASER 11.4 09/01/2023   LAMBDASER 14.9 03/06/2023   LAMBDASER 13.2 07/29/2022   KAPLAMBRATIO 2.40 (H) 09/01/2023   KAPLAMBRATIO 2.51 (H) 03/06/2023   KAPLAMBRATIO 2.95 (H) 07/29/2022    RADIOGRAPHIC STUDIES: No results found.  ASSESSMENT & PLAN George Franco 68 y.o. male with medical history significant for IgG kappa MGUS who presents for a follow up visit.   # IgG Kappa Monoclonal Gammopathy  # T6 Compression Fracture  --at each visit will order an SPEP, SFLC and CBC, CMP, and LDH --recommend a metastatic bone survey to assess for lytic lesions yearly. Next due in Jan 2025.  --Bone marrow biopsy on 09/02/2022 confirms MGUS with 5% plasma cells. -- Had a T6 kyphoplasty with biopsy, negative for carcinoma.  At this time this does not appear to be a myelomatous lesion resulting in the fracture. --labs today show white blood cell 6.4, hemoglobin 1.0, MCV 96, platelets 169 -- Return to clinic in 6 months time for labs and 12 months for clinic visit for further monitoring.  No orders of the defined types were placed in this encounter.   All questions were answered. The patient knows to call the clinic with any problems, questions or concerns.  A total of more than 25 minutes were spent on this encounter with face-to-face time and non-face-to-face time, including preparing to see the patient, ordering tests and/or medications, counseling the patient and coordination of care as outlined above.   Ulysees Barns, MD Department of Hematology/Oncology [facility] Phone: [phone] Pager: [phone] Email: [email or web address]  09/06/2023 5:04 PM

## 2023-09-06 ENCOUNTER — Inpatient Hospital Stay: Payer: Medicare HMO | Admitting: Hematology and Oncology

## 2023-09-06 VITALS — BP 156/78 | HR 88 | Temp 98.2°F | Resp 16 | Wt 158.0 lb

## 2023-09-06 DIAGNOSIS — D472 Monoclonal gammopathy: Secondary | ICD-10-CM

## 2023-09-06 DIAGNOSIS — M4854XA Collapsed vertebra, not elsewhere classified, thoracic region, initial encounter for fracture: Secondary | ICD-10-CM | POA: Diagnosis not present

## 2023-09-06 DIAGNOSIS — F17210 Nicotine dependence, cigarettes, uncomplicated: Secondary | ICD-10-CM | POA: Diagnosis not present

## 2023-09-06 DIAGNOSIS — Z8042 Family history of malignant neoplasm of prostate: Secondary | ICD-10-CM | POA: Diagnosis not present

## 2023-09-12 ENCOUNTER — Ambulatory Visit (INDEPENDENT_AMBULATORY_CARE_PROVIDER_SITE_OTHER): Payer: Medicare HMO | Admitting: Podiatry

## 2023-09-12 ENCOUNTER — Encounter: Payer: Self-pay | Admitting: Podiatry

## 2023-09-12 DIAGNOSIS — M79674 Pain in right toe(s): Secondary | ICD-10-CM | POA: Diagnosis not present

## 2023-09-12 DIAGNOSIS — M79675 Pain in left toe(s): Secondary | ICD-10-CM

## 2023-09-12 DIAGNOSIS — B351 Tinea unguium: Secondary | ICD-10-CM

## 2023-09-12 DIAGNOSIS — E1142 Type 2 diabetes mellitus with diabetic polyneuropathy: Secondary | ICD-10-CM

## 2023-09-18 NOTE — Progress Notes (Signed)
  Subjective:  Patient ID: George Franco, male    DOB: 1956-03-04,  MRN: [id number]  68 y.o. male presents to clinic with  at risk foot care with history of diabetic neuropathy and painful, elongated thickened toenails x 10 which are symptomatic when wearing enclosed shoe gear. This interferes with his/her daily activities. His wife is present during today's visit. Chief Complaint  Patient presents with   DFc    He is heer for nail trim, Last A1C was 5.5, PCP is Dr. Nichole seen in 11/24.   New problem(s): None   PCP is South, Garnette, MD.  No Known Allergies  Review of Systems: Negative except as noted in the HPI.   Objective:  George Franco is a pleasant 68 y.o. male WD, WN in NAD. AAO x 3.  Vascular Examination: Vascular status intact b/l with palpable pedal pulses. CFT immediate b/l. No edema. No pain with calf compression b/l. Skin temperature gradient WNL b/l. No cyanosis or clubbing noted b/l LE.  Neurological Examination: Sensation grossly intact b/l with 10 gram monofilament. Vibratory sensation intact b/l. Pt has subjective symptoms of neuropathy.  Dermatological Examination: Pedal skin with normal turgor, texture and tone b/l. Toenails 1-5 b/l thick, discolored, elongated with subungual debris and pain on dorsal palpation. No hyperkeratotic lesions noted b/l.   Musculoskeletal Examination: Muscle strength 5/5 to b/l LE. HAV with bunion deformity noted b/l LE. Hammertoe deformity noted 2-5 b/l. Utilizes wheelchair for mobility assistance.  Radiographs: None  Last A1c:       No data to display           Assessment:   1. Pain due to onychomycosis of toenails of both feet   2. Diabetic peripheral neuropathy associated with type 2 diabetes mellitus (HCC)    Plan:  -Patient was evaluated today. All questions/concerns addressed on today's visit. -Continue foot and shoe inspections daily. Monitor blood glucose per PCP/Endocrinologist's recommendations. -Patient to  continue soft, supportive shoe gear daily. -Toenails 1-5 b/l were debrided in length and girth with sterile nail nippers and dremel without iatrogenic bleeding.  -Patient/POA to call should there be question/concern in the interim.  Return in about 3 months (around 12/10/2023).  George Franco, DPM      [facility] LOCATION: [address], [place] [address]                   Office [phone]   [facility] LOCATION: [address] [place], [place] [address] Office [phone]

## 2023-10-25 DIAGNOSIS — H40013 Open angle with borderline findings, low risk, bilateral: Secondary | ICD-10-CM | POA: Diagnosis not present

## 2023-10-25 DIAGNOSIS — Z794 Long term (current) use of insulin: Secondary | ICD-10-CM | POA: Diagnosis not present

## 2023-10-25 DIAGNOSIS — H353112 Nonexudative age-related macular degeneration, right eye, intermediate dry stage: Secondary | ICD-10-CM | POA: Diagnosis not present

## 2023-10-25 DIAGNOSIS — E113293 Type 2 diabetes mellitus with mild nonproliferative diabetic retinopathy without macular edema, bilateral: Secondary | ICD-10-CM | POA: Diagnosis not present

## 2023-10-25 DIAGNOSIS — H353124 Nonexudative age-related macular degeneration, left eye, advanced atrophic with subfoveal involvement: Secondary | ICD-10-CM | POA: Diagnosis not present

## 2023-10-30 DIAGNOSIS — H35412 Lattice degeneration of retina, left eye: Secondary | ICD-10-CM | POA: Diagnosis not present

## 2023-10-30 DIAGNOSIS — H35033 Hypertensive retinopathy, bilateral: Secondary | ICD-10-CM | POA: Diagnosis not present

## 2023-10-30 DIAGNOSIS — H353132 Nonexudative age-related macular degeneration, bilateral, intermediate dry stage: Secondary | ICD-10-CM | POA: Diagnosis not present

## 2023-10-30 DIAGNOSIS — H3322 Serous retinal detachment, left eye: Secondary | ICD-10-CM | POA: Diagnosis not present

## 2023-10-30 DIAGNOSIS — H2513 Age-related nuclear cataract, bilateral: Secondary | ICD-10-CM | POA: Diagnosis not present

## 2023-10-30 DIAGNOSIS — H40013 Open angle with borderline findings, low risk, bilateral: Secondary | ICD-10-CM | POA: Diagnosis not present

## 2023-10-30 DIAGNOSIS — E113493 Type 2 diabetes mellitus with severe nonproliferative diabetic retinopathy without macular edema, bilateral: Secondary | ICD-10-CM | POA: Diagnosis not present

## 2023-10-30 DIAGNOSIS — H353221 Exudative age-related macular degeneration, left eye, with active choroidal neovascularization: Secondary | ICD-10-CM | POA: Diagnosis not present

## 2023-11-29 DIAGNOSIS — N182 Chronic kidney disease, stage 2 (mild): Secondary | ICD-10-CM | POA: Diagnosis not present

## 2023-11-29 DIAGNOSIS — F331 Major depressive disorder, recurrent, moderate: Secondary | ICD-10-CM | POA: Diagnosis not present

## 2023-11-29 DIAGNOSIS — F01C0 Vascular dementia, severe, without behavioral disturbance, psychotic disturbance, mood disturbance, and anxiety: Secondary | ICD-10-CM | POA: Diagnosis not present

## 2023-11-29 DIAGNOSIS — M899 Disorder of bone, unspecified: Secondary | ICD-10-CM | POA: Diagnosis not present

## 2023-11-29 DIAGNOSIS — I129 Hypertensive chronic kidney disease with stage 1 through stage 4 chronic kidney disease, or unspecified chronic kidney disease: Secondary | ICD-10-CM | POA: Diagnosis not present

## 2023-11-29 DIAGNOSIS — Z794 Long term (current) use of insulin: Secondary | ICD-10-CM | POA: Diagnosis not present

## 2023-11-29 DIAGNOSIS — E1159 Type 2 diabetes mellitus with other circulatory complications: Secondary | ICD-10-CM | POA: Diagnosis not present

## 2023-11-29 DIAGNOSIS — D472 Monoclonal gammopathy: Secondary | ICD-10-CM | POA: Diagnosis not present

## 2023-12-13 ENCOUNTER — Ambulatory Visit: Payer: Medicare HMO | Admitting: Podiatry

## 2023-12-21 ENCOUNTER — Encounter: Payer: Self-pay | Admitting: Podiatry

## 2023-12-21 ENCOUNTER — Ambulatory Visit (INDEPENDENT_AMBULATORY_CARE_PROVIDER_SITE_OTHER): Admitting: Podiatry

## 2023-12-21 DIAGNOSIS — M79675 Pain in left toe(s): Secondary | ICD-10-CM | POA: Diagnosis not present

## 2023-12-21 DIAGNOSIS — E1142 Type 2 diabetes mellitus with diabetic polyneuropathy: Secondary | ICD-10-CM

## 2023-12-21 DIAGNOSIS — M79674 Pain in right toe(s): Secondary | ICD-10-CM | POA: Diagnosis not present

## 2023-12-21 DIAGNOSIS — B351 Tinea unguium: Secondary | ICD-10-CM | POA: Diagnosis not present

## 2023-12-21 NOTE — Progress Notes (Signed)
 This patient returns to my office for at risk foot care.  This patient requires this care by a professional since this patient will be at risk due to having CVA, CKD and DM.  This patient is unable to cut nails himself since the patient cannot reach his nails.These nails are painful walking and wearing shoes.  This patient presents for at risk foot care today.  General Appearance  Alert, conversant and in no acute stress.  Vascular  Dorsalis pedis and posterior tibial  pulses are palpable  bilaterally.  Capillary return is within normal limits  bilaterally. Temperature is within normal limits  bilaterally.  Neurologic  Senn-Weinstein monofilament wire test within normal limits  bilaterally. Muscle power within normal limits bilaterally.  Nails Thick disfigured discolored nails with subungual debris  from hallux to fifth toes bilaterally. No evidence of bacterial infection or drainage bilaterally.  Orthopedic  No limitations of motion  feet .  No crepitus or effusions noted.  HAV  B/L.  Hammer toes   Skin  normotropic skin with no porokeratosis noted bilaterally.  No signs of infections or ulcers noted.     Onychomycosis  Pain in right toes  Pain in left toes  Consent was obtained for treatment procedures.   Mechanical debridement of nails 1-5  bilaterally performed with a nail nipper.  Filed with dremel without incident. Patient has callus along nail groove left hallux s/p nail surgery.   Return office visit    3 months                  Told patient to return for periodic foot care and evaluation due to potential at risk complications.   Ruffin Cotton DPM

## 2024-01-29 ENCOUNTER — Ambulatory Visit: Payer: Medicare HMO | Admitting: Neurology

## 2024-01-31 NOTE — Progress Notes (Signed)
 [facility]    Provider:  Dr Ines Requesting Provider: Nichole Senior, MD Primary Care Provider:  Nichole Senior, MD    CC: cognitive decline  02/07/2024: He is not eating as much, confined to the bedroom, he is depressed. Not focused on personal hygiene, wife is very stressed and has a lot on her and recently broke her ankle and was in a nursing facility and patient was at home alone, his taste is impaired and he is not eating. He doesn;t want to socialize or even gon on the front porch. He I sup all night walking. She has an alarm.   Not sleeping well at night, sleeping during the day and up all night. He doesn't have much of an a[[etite, if sweet he can eat it but food doen't appeal to him anymore, discussed ensure protein drinks and boost, he is not getting around well, he feels weak, 5 years ago weight was 201 and today 167, advised to discuss with Dr. Nichole, refuses dietician. Denies depression, he states he has never been social and he needs to get out more, his osn is here and provides information and offers to take him fishing. [facility]iscussed [facility], they can help with medicaid which can provide more resources and maybe even [facility] and transportation and caretakers that won;t be covered under medicare. Showering every other day. Still smoking, advised cessation, no constipation, taste decreased, smell is ok, no recent falls, can't stand long because of the dizziness and lightheadedness. Went to physical therapy and stopped. Can send PT to the house.   04/19/2023:  George Franco is a 68 y.o. male here as requested by Nichole Senior, MD. has Dizziness and giddiness; Essential hypertension, benign; Malignant neoplasm of prostate (HCC); Abnormal CT scan of lung - RLL ground glass density; BPPV (benign paroxysmal positional vertigo); Lacunar stroke (HCC); Cervical myelopathy (HCC); Asymmetric SNHL (sensorineural hearing loss); Disequilibrium; Acquired cerebral  atrophy (HCC); Acute bilateral thoracic back pain; Chronic kidney disease due to hypertension; Hardening of the aorta (main artery of the heart) (HCC); History of prostate cancer; History of stroke; Hyperlipidemia; Intervertebral disc disorder of cervical region with myelopathy; Lesion of bone of thoracic spine; Long term (current) use of insulin  (HCC); and Major depressive disorder, recurrent, moderate (HCC) on their problem list.. Last seen 06/26/2021.   Not swallowing. Chews and spits food out. Down to 163. Need swallow study and needs a DAT scan. 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 The main symptom is clumsiness (ataxia) that slowly gets worse. There may also be problems with balance, slurring of speech, and difficulty walking. Other symptoms may include: Abnormal eye movements which patient has on exam. We spent extended time reviewing imaging and disorder and workup and symptoms (see A/P).   We have seen patient for multiple symptoms including vertical, cervical stenosis, strokes, imbalance. Had surgery for multiple cervical stenosis  wife provides most information. Here today for cognitive decline. He still feels dizzy. He doesn't have a good appetite, he is drinking fluids, we have done most everything we can do, sent to vestibular therapy, he has diabetic polyneuropathy, prior cervical stenosis sequelae, multiple strokes, mRI with white matter changes which can also cause cognitive changes as well as gait problems, Stable, no falls. Get up and has dizziness, also when turning head, constant dizziness. He saw Dr. Corina and has likely vascular dementia and he suggests sleep test. No back  pain. No neck pain.   Dr. Darden notes:  I suspect that the primary etiological factor creating progressive changes in cognitive functioning are related to significant microvascular ischemic changes and  potential small lacunar infarctions.  This combined with significant pain conditions, dizziness and vertigo, sustained sleep disturbance, and peripheral neuropathy secondary to type 2 diabetes that is insulin -dependent are also having a contributing factor.  Other issues of concern of the possibility of an underlying obstructive sleep apnea.    I would recommend that the patient have assessment for possible obstructive sleep apnea.  While the level of cognitive deficits could not be explained by this factor alone it is something that given his issues with hypertension, indications of microvascular ischemic changes/small vessel disease, memory and cognitive deficits would suggest that this assessment would be warranted with the patient having multiple risk factors for obstructive sleep apnea.  I will sit down with the patient and his wife and go over the results of the current neuropsychological evaluation.   Wife is concerned for his memory. MMSE 22/30. He has been diagnosed with vascular dementia by formal memory testing. He also has chronic low back pain which may ne affecting his mbalance an no one has ever MRIed his lower back.     02/07/2024    2:06 PM 01/12/2023    2:04 PM  MMSE - Mini Mental State Exam  Orientation to time 4 3  Orientation to Place 5 5  Registration 3 3  Attention/ Calculation 2 1  Recall 2 1  Language- name 2 objects 2 2  Language- repeat 1 1  Language- follow 3 step command 2 3  Language- read & follow direction 1 1  Write a sentence 1 1  Copy design 0 1  Total score 23 22   Patient complains of symptoms per HPI as well as the following symptoms: cognitive decline . Pertinent negatives and positives per HPI. All others negative   Reviewed notes, labs and imaging from outside physicians, which showed:  MRI of the cervical spine has multi-level moderate and severe stenosis with foraminal stenosis with compresive myelopathy. I think he needs evaluation for cervical  decompression.At one level he has compressive myelopathy. I'd like to send him to Dr. Carollee and/or Dr. Estella. Including them on this correspindence in case they feel differently but I think he needs neurosurgical evaluation and surgery. Please discuss with patent and I will place referral to our wonderful neurosurgical colleagues, thanks  As far as his brain,w e can address at a later time, this needs to be first see below   IMPRESSION: This MRI of the cervical spine without contrast shows the following: 1.   Increased T2 signal within the right hemicord adjacent to C5 and C6-C7, consistent with compressive myelopathic change. 2.   At C3-C4, there is mild spinal stenosis and moderate left foraminal narrowing though there does not appear to be nerve root compression. 3.   At C4-C5, there is moderate spinal stenosis and moderate to moderately severe bilateral foraminal narrowing with some potential for C5 nerve root compression to either side. 4.   At C5-C6, there is moderately severe spinal stenosis and moderately severe bilateral foraminal narrowing with potential for C6 nerve root compression to either side. 5.   At C6-C7, there is moderately severe spinal stenosis, more to the right, and moderately severe left foraminal narrowing with potential for left C7 nerve root compression.  Narrative & Impression    [facility] [address], [address] [place], [place] [address] [phone]   NEUROIMAGING REPORT     STUDY DATE: 08/02/2021   PATIENT NAME: George Franco DOB: 02-09-1956 MRN: [id number]   EXAM: MRI Brain/IAC's with and without contrast   ORDERING CLINICIAN: Harlene Bogaert, NP CLINICAL HISTORY: 68 year old man with dizziness and imbalance COMPARISON FILMS: MRI 05/28/2019   TECHNIQUE: MRI of the brain with and without contrast was obtained utilizing 5 mm axial slices with T1, T2, T2 flair, susceptibility weighted and diffusion weighted views.  T1 sagittal, T2  coronal and postcontrast views in the axial and coronal plane were obtained.  Additional thin section axial CISS and axial and coronal pre and postcontrast T1-weighted images through the internal auditory canals were performed. CONTRAST: 19 ml MultiHance  IMAGING SITE: [facility], [address] [place], [place], [place]   FINDINGS: On sagittal images, the spinal cord is imaged caudally to C2-C3 and is normal in caliber.   The contents of the posterior fossa are of normal size and position.   The pituitary gland and optic chiasm appear normal.    Brain volume appears normal for age.  Mild cerebellar atrophy is noted, similar to the 2020 MRI.SABRA   The ventricles are normal in size and without distortion.  There are no abnormal extra-axial collections of fluid.     There are multiple T2/FLAIR hyperintense foci in the hemispheres with confluencies noted in the frontal lobes.  None of the foci enhanced or appear to be acute.  Multiple foci are noted within the pons and the right middle cerebellar peduncle.  Diffusion weighted images are normal.  Some foci of the hemispheres and pons have an appearance consistent with chronic lacunar infarctions.  None of the foci appear to be acute.  They do not enhance.  Compared to the MRI from 05/28/2019, more foci are noted within the pons as well as in the hemispheres.  Susceptibility weighted images show a single chronic microhemorrhage in the right occipital lobe, not present on the previous MRI.     The VIIth/VIIIth nerve complex appears normal.  The mastoid air cells appear normal.  The orbits appear normal.   Mild mucoperiosteal thickening is noted in the maxillary sinuses.  The other paranasal sinuses appear normal.  Flow voids are identified within the major intracerebral arteries.      After the infusion of contrast material, a normal enhancement pattern is noted.     IMPRESSION: This MRI of the brain with and without contrast with added attention to the internal  auditory canal shows the following: 1.  Multiple T2/FLAIR hyperintense foci in the hemispheres and pons.  Many of the foci are nonspecific.  However, some of the foci have appearance consistent with chronic lacunar infarctions and other foci show an appearance consistent with chronic microvascular ischemic change.  Demyelination is not ruled out for some of the foci.  The extent has progressed compared to the 2020 MRI. 2.  Single chronic microhemorrhage in the right occipital lobe was not present on the 2020 MRI.   3.   Internal auditory canals appear normal. 4.   No acute findings.  Normal enhancement pattern.    Reviewed his MRi cervical spine: Patient'ss MRI of the cervical spine has multi-level moderate and severe stenosis with foraminal stenosis with compresive myelopathy. I think he needs evaluation for cervical decompression.At one level he has compressive myelopathy. I'd like to send him to Dr. Carollee and/or Dr. Estella. Including them on this correspindence in case they feel differently but I think he needs  neurosurgical evaluation and surgery. Please discuss with patent and I will place referral to our wonderful neurosurgical colleagues, thanks  As far as his brain,w e can address at a later time, this needs to be first see below   IMPRESSION: This MRI of the cervical spine without contrast shows the following: 1.   Increased T2 signal within the right hemicord adjacent to C5 and C6-C7, consistent with compressive myelopathic change. 2.   At C3-C4, there is mild spinal stenosis and moderate left foraminal narrowing though there does not appear to be nerve root compression. 3.   At C4-C5, there is moderate spinal stenosis and moderate to moderately severe bilateral foraminal narrowing with some potential for C5 nerve root compression to either side. 4.   At C5-C6, there is moderately severe spinal stenosis and moderately severe bilateral foraminal narrowing with potential for C6 nerve root  compression to either side. 5.   At C6-C7, there is moderately severe spinal stenosis, more to the right, and moderately severe left foraminal narrowing with potential for left C7 nerve root compression.  Patient complains of symptoms per HPI as well as the following symptoms: decline . Pertinent negatives and positives per HPI. All others negative  Update 07/06/2021 JM: Returns for 19-month follow-up unaccompanied   C/o imbalance - continued use of cane at all times. He did have a recent fall (tripped over a drop cord) thankfully without injury. No other recent falls. More so feels unsteady - is not worse specifically on uneven ground.  At first stated not new issue but then reported it was new - once asked for how long, he stated since the last time he was here 6 months ago. Denies any reoccurring dizziness or vertigo but then reported he will have dizziness with quick head movements which is not new. He has been increasing water  intake.    Previously seen in office for chronic persistent worsening vertigo/dizziness by Dr. Ines - he has not yet completed recommend MR brain and cervical spine.  He was seen by ENT 02/01/2021 who recommended vestibular therapy for his continued disequilibrium which is likely microvascular issue.  Previously participated in vestibular therapy for only 1 session due to financial reasons - he did not do any additional therapy sessions since prior visit.   12/30/2020: patient here for dizziness, was referred to vestibular PT but only went once and declined any further appointments despite recommendations that this may help. He is back for follow up. He says vertigo has been terrible. It never stops. Feelings of motion and dizziness when standing or walking. If he looks up he gets dizzy, turn head either way he feels dizzy, feels like motion when there is no motion, happens even when sitting.  Not recently to ENT, decreased hearing in the right ear. He has imbalance with  walking as well.   Today, 09/30/2020, George Franco is being seen for acute visit. He was previously seen by Dr. Ines in 05/2019 for vertigo type episodes and stable since that time but reports 2-week onset of dizziness greatly interfering with ambulation. Evaluated by PCP who prescribed meclizine  and advised to follow-up with our office for further evaluation.  Reports dizziness has been constant present upon awakening and worsening throughout the day.  He also reports worsening when turning head quickly from side to side or when he stands from sitting for prolonged period.  Denies symptoms while sitting.  He has been using a cane which he was using previously and denies any recent falls.  Use of meclizine  without benefit.  He will occasionally have difficulty focusing with both eyes open but denies blurred or double vision.  He does admit to having a prescription for glasses but lost them and has not had recent follow-up with ophthalmology.  Denies weakness, numbness/tingling, speech or language difficulties or headache.  Towards the end of visit, he also reports diminished appetite and minimal to no fluid intake throughout the day  Recent lab work by PCP personally reviewed which was all satisfactory    History provided for reference purposes only Update 06/05/2019 Dr. Ines: today patient comes in for follow up, multiple lacunar strokes in the right pons, discussed lacunar strokes, causes, he smells heavily of cigarette smoke and we discussed smoking as a big risk factor, cholesterol, diabetes. We reviewed all the images togather, showed him the microvascular chronic changes. He needs close follow up and strict management of vascular risk factors. Will complete stroke workup with CTA head and echocardiogram.   Initial visit 05/13/2019 Dr. Ines:  Lumir Demetriou is a 68 y.o. male here as requested by Nichole Senior, MD for vertigo.  Past medical history hypertension, hyperlipidemia, LVH, BPH, knee pain,  CTS, hypergonadism, prostate cancer 2016 stage T1c adenocarcinoma of the prostate with a Gleason score 4+3 and a PSA of 4.48, vestibular neuropathy per MRI April 2016, persistent vertigo. Vertigo started several years ago, it is episodic, it hits him all of a sudden, he feels dizzy and feels like he is going to fall. He is going to have another MRI 05/28/2019 ordered by Dr. Nichole. Vertigo lasts all day, if he turn to the left the vertigo worsens, he says the episodes come and go and he started having the symptoms sine 8am. If he turns his head he can make the symptoms worse. He was shown the Epley Maneuvers by ENT. Never been to vestibular therapy. No vomiting. No weakness or vision changes or any other focal deficit. Sitting still helps the vertigo. No inciting events, he has several episodes a year lasting 1-2 days. He sometimes has headaches, frontal, dull not migrainous. Lack of sleep worsens. Melatonin helps him sleep. No other focal neurologic deficits, associated symptoms, inciting events or modifiable factors.  Reviewed notes, labs and imaging from outside physicians, which showed: 03/25/2019: Ct w/wo showed No acute intracranial abnormalities including mass lesion or mass effect, hydrocephalus, extra-axial fluid collection, midline shift, hemorrhage, or acute infarction, large ischemic events (personally reviewed images)  Reviewed MRI brain report 11/2014: FINDINGS: Major intracranial vascular flow voids are stable. Cerebral volume is normal. No restricted diffusion to suggest acute infarction. No midline shift, mass effect, evidence of mass lesion, ventriculomegaly, extra-axial collection or acute intracranial hemorrhage. Cervicomedullary junction and pituitary are within normal limits. Negative visualized cervical spine.  Cbc normal, cmp with elevated glucose otherwise unremarkable   Elnor and white matter signal throughout the brain appears within normal limits for age and not changed  since 2007. No cortical encephalomalacia or chronic blood products identified.   Diminutive appearance of both IAC which is chronic. Other visible internal auditory structures appear grossly normal in the mastoids are clear.   Negative paranasal sinuses. Visualized orbit soft tissues are within normal limits. Normal bone marrow signal. Visualized scalp soft tissues are within normal limits.   IMPRESSION: 1. Stable and essentially normal for age noncontrast MRI appearance of the brain. 2. Chronic diminutive appearance of the bony internal auditory canals. Is there any evidence of vestibulocochlear neuropathy, or any other cranial neuropathies to suggest entrapment such  as due to hyperostosis crani  alis interna?  I reviewed Dr. Rosalene notes.  Patient presented for short-term disability and restrictions form to be filled out.  Dizziness had improved a little bit at last appointment.  He had been offered neurology referral in the past but declined but at the last appointment wanted to see neurology.  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  No vision changes.  I reviewed examination which showed slightly elevated blood pressure at 155/74, otherwise physical exam was normal, he did complain of vertigo, dizziness and gait instability.   Review of Systems: Patient complains of symptoms per HPI as well as the following symptoms: dizziness . Pertinent negatives and positives per HPI. All others negative    Social History   Socioeconomic History   Marital status: Married    Spouse name: Not on file   Number of children: 3   Years of education: Not on file   Highest education level: Not on file  Occupational History   Occupation: Location manager    Employer: [employer]  Tobacco Use   Smoking  status: Every Day    Current packs/day: 0.50    Average packs/day: 0.5 packs/day for 20.0 years (10.0 ttl pk-yrs)    Types: Cigarettes   Smokeless tobacco: Never  Vaping Use   Vaping status: Never Used  Substance and Sexual Activity   Alcohol use: Not Currently    Alcohol/week: 2.0 standard drinks of alcohol    Types: 2 Shots of liquor per week   Drug use: No   Sexual activity: Yes    Partners: Female  Other Topics Concern   Not on file  Social History Narrative   Married, 2 sons one daughter   Retired    Caffeine: none    0-1 alcoholic drinks/week   He is a smoker   No drug use   Lives at home with wife   Social Drivers of Corporate investment banker Strain: Not on file  Food Insecurity: Not on file  Transportation Needs: Not on file  Physical Activity: Not on file  Stress: Not on file  Social Connections: Not on file  Intimate Partner Violence: Not on file    Family History  Problem Relation Age of Onset   Prostate cancer Father    Diabetes Father    Diabetes Mother    Diabetes Brother    Colon cancer Neg Hx    Esophageal cancer Neg Hx    Liver cancer Neg Hx    Pancreatic cancer Neg Hx    Rectal cancer Neg Hx    Stomach cancer Neg Hx     Past Medical History:  Diagnosis Date   At risk for sleep apnea    STOP-BANG= 5     SENT TO PCP 06-02-2015   BPH (benign prostatic hyperplasia)    CTS (carpal tunnel syndrome)    CTS (carpal tunnel syndrome)    ED (erectile dysfunction) of organic origin    Hyperlipidemia    Hypertension    Hypogonadism in male    Knee pain    pt said he doesn't know anything about this   Lower urinary tract symptoms (LUTS)    LVH (left ventricular hypertrophy)    Persistent vertigo of central origin    Prostate  cancer [facility]) urologist-  dr ottelin/  oncologist-  dr patrcia   Stage T1c, Gleason 4+3, PSA 4.48,  vol 32.9cc--  External RXT complete 05-08-2015   Stroke [facility])    Type 2 diabetes mellitus (HCC)    Vestibular neuropathy     Wears glasses     Patient Active Problem List   Diagnosis Date Noted   Acquired cerebral atrophy (HCC) 10/03/2022   Acute bilateral thoracic back pain 10/03/2022   History of prostate cancer 10/03/2022   History of stroke 10/03/2022   Intervertebral disc disorder of cervical region with myelopathy 10/03/2022   Lesion of bone of thoracic spine 10/03/2022   Major depressive disorder, recurrent, moderate (HCC) 10/03/2022   Cervical myelopathy (HCC) 11/02/2021   Asymmetric SNHL (sensorineural hearing loss) 02/01/2021   Disequilibrium 02/01/2021   Hardening of the aorta (main artery of the heart) (HCC) 07/12/2019   Lacunar stroke (HCC) 06/05/2019   BPPV (benign paroxysmal positional vertigo) 05/13/2019   Chronic kidney disease due to hypertension 03/29/2019   Abnormal CT scan of lung - RLL ground glass density 01/26/2018   Long term (current) use of insulin  (HCC) 10/30/2017   Malignant neoplasm of prostate (HCC) 01/26/2015   Dizziness and giddiness 11/18/2014   Essential hypertension, benign 11/18/2014   Hyperlipidemia 08/24/2009    Past Surgical History:  Procedure Laterality Date   ANTERIOR CERVICAL DECOMP/DISCECTOMY FUSION N/A 11/02/2021   Procedure: Anterior Cervical Decompression Fusion, Cervical four-five, Cervical five-six, Cervical six-seven;  Surgeon: Carollee Lani BROCKS, DO;  Location: [facility] OR;  Service: Neurosurgery;  Laterality: N/A;   COLONOSCOPY     IR ANGIOGRAM EXTREMITY RIGHT  03/05/2020   IR BONE MARROW BIOPSY & ASPIRATION  09/02/2022   IR BONE TUMOR(S)RF ABLATION  06/23/2022   IR FEM POP ART PTA MOD SED  03/05/2020   IR KYPHO THORACIC WITH BONE BIOPSY  06/23/2022   IR RADIOLOGIST EVAL & MGMT  02/26/2020   IR RADIOLOGIST EVAL & MGMT  04/15/2020   IR RADIOLOGIST EVAL & MGMT  07/22/2020   IR RADIOLOGIST EVAL & MGMT  05/24/2022   IR RADIOLOGIST EVAL & MGMT  07/07/2022   IR TIB-PERO ART ATHEREC INC PTA MOD SED  03/05/2020   IR US  GUIDE VASC ACCESS RIGHT  03/05/2020   PROSTATE  BIOPSY     RADIOACTIVE SEED IMPLANT N/A 06/05/2015   Procedure: RADIOACTIVE SEED IMPLANT/BRACHYTHERAPY IMPLANT;  Surgeon: Mark Ottelin, MD;  Location: [facility] [place];  Service: Urology;  Laterality: N/A;   ROTATOR CUFF REPAIR Right 2014   UMBILICAL HERNIA REPAIR  1990's   UPPER GASTROINTESTINAL ENDOSCOPY     wisdom teeth extrat      Current Outpatient Medications  Medication Sig Dispense Refill   aspirin  EC 81 MG tablet Take 1 tablet (81 mg total) by mouth daily. 30 tablet 0   benazepril  (LOTENSIN ) 40 MG tablet Take 40 mg by mouth daily.     donepezil  (ARICEPT ) 5 MG tablet Take 1 tablet (5 mg total) by mouth at bedtime. 30 tablet 3   ezetimibe  (ZETIA ) 10 MG tablet Take 10 mg by mouth daily.     FARXIGA  10 MG TABS tablet Take 10 mg by mouth daily.     loperamide (IMODIUM) 2 MG capsule Take 2 mg by mouth every 6 (six) hours as needed for diarrhea or loose stools.  0   meclizine  (ANTIVERT ) 25 MG tablet Take 1 tablet (25 mg total) by mouth 3 (three) times daily as needed for dizziness. 30 tablet 2  methocarbamol  (ROBAXIN ) 500 MG tablet Take 1 tablet (500 mg total) by mouth 4 (four) times daily. 90 tablet 2   NOVOLOG  FLEXPEN 100 UNIT/ML FlexPen Inject 22 Units into the skin 3 (three) times daily with meals.  6   ONETOUCH VERIO test strip USE TO TEST BLOOD SUGAR 3 TIMES A DAY PRIOR TO MEALS AND RECORD     rosuvastatin  (CRESTOR ) 20 MG tablet Take 20 mg by mouth at bedtime.     insulin  glargine (LANTUS  SOLOSTAR) 100 UNIT/ML Solostar Pen Inject 22 units Subcutaneous daily and increase as directed in clinic (up to 50 units daily) dx E11.59 (Patient not taking: Reported on 02/07/2024)     No current facility-administered medications for this visit.    Allergies as of 02/07/2024   (No Known Allergies)    Vitals: Today's Vitals   02/07/24 1357  BP: (!) 127/54  Pulse: (!) 55  Weight: 167 lb 3.2 oz (75.8 kg)  Height: 5' 9 (1.753 m)     Body mass index is 24.69 kg/m.    Physical exam: Exam: Gen: NAD, not conversant very fatigued and falling asleep arousable and when awake is fluent with normal comprehension                      CV: RRR, no MRG. No Carotid Bruits. No peripheral edema, warm, nontender Eyes: Conjunctivae clear without exudates or hemorrhage  Neuro: Detailed Neurologic Exam  Speech:    Speech is normal; fluent and spontaneous with impaired comprehension.  Cognition:      02/07/2024    2:06 PM 01/12/2023    2:04 PM  MMSE - Mini Mental State Exam  Orientation to time 4 3  Orientation to Place 5 5  Registration 3 3  Attention/ Calculation 2 1  Recall 2 1  Language- name 2 objects 2 2  Language- repeat 1 1  Language- follow 3 step command 2 3  Language- read & follow direction 1 1  Write a sentence 1 1  Copy design 0 1  Total score 23 22     Cranial Nerves:    The pupils are equal, round, and reactive to light. Attempted, fundi too small to visualize. Visual fields are ful to threat. Extraocular movements are impaired difficult to examine due to falling asleep/somnolence. Trigeminal sensation is intact. The face is symmetric. The palate elevates in the midline. Hearing impaired. Voice is normal. Shoulder shrug is normal. The tongue has normal motion without fasciculations.   Coordination: Dysmetria on FTN and HTN. Impaired fine motor movement.   Gait:   Wide based, imbalance, cannot ambulate independently, difficulty with transfer  Motor Observation:    No asymmetry, no atrophy, and no involuntary movements noted. Tone:    Normal muscle tone, no increased tone or rigidity  Posture: In wheelchair upright    Strength: antigravity, no focal deficits noted, 5/5      Sensation: intact to LT     Reflex Exam:  DTR's:    Deep tendon reflexes in the upper and lower extremities are symmetrical  bilaterally.  Brisk uppers and lowers. Absent AJs.  Toes:    The toes are equiv bilaterally.   Clonus:    Clonus is  absent.   Assessment/Plan:  68 year old with dementia, gait abnormality, decreased ability to perform ADLs, weight loss.  Seen by us  previously multiple times back through 2020) with imaging showing evidence of multiple pontine infarcts on imaging likely incidental in setting of small vessel  disease, vascular dementia, atrophy of the cerebellum and pons.George Franco is a 68 y.o. male here as requested by Nichole Senior, MD. 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 Last seen 06/26/2021. Not swallowing. Chews and spits food out. Down to 163. Need swallow study and needs a DAT scan. 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 The main symptom is clumsiness (ataxia) that slowly gets worse c/w patient exam and symptoms. There may also be problems with balance, slurring of speech, and difficulty walking. Other symptoms may include: Abnormal eye movements which patient has on exam. We spent extended time reviewing imaging and disorder and workup and symptoms (see A/P).    We have seen patient for multiple symptoms including vertigo, cervical stenosis(s/p surgery),, strokes, imbalance. Had surgery for multiple cervical stenosis  wife  provides most information. Here today for persistent dizziness, imbalance, cognitive decline. He still feels dizzy. He doesn't have a good appetite, he is drinking fluids, Imbalance likely multifactorial: we have sent to vestibular therapy, he has diabetic polyneuropathy, prior cervical stenosis sequelae, multiple strokes, mRI with white matter changes which can also cause cognitive changes as well as gait problems, Stable, no falls. Get up and has dizziness, also when turning head, constant dizziness. He saw Dr. Corina and has likely vascular dementia and he suggests sleep test which we ordered but not comp;eted. 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 Sleep study - referred in the past, he declines(Dr. Corina: Other issues of concern of the possibility of an underlying obstructive sleep apnea.  I would recommend that the patient have assessment for possible obstructive sleep apnea.) Swallow study - [place] for the study referred in the past, denies any current problems DAT Scan - negative, may be a false negative as MRI clearly shows pontine and cerebellar atrophy c/w MSA cerebellar type ENT referral placed in the past but currently denies any swallowig problems Cervical spine s/p acdf 2023 Pulse is low, stop the 10mg  of Aricept /Donepezil   Topical voltaren for the joints/hands Boost or Ensure supplements Look into [facility] and Medicaid PT/OT/nursing to the house  Fall prevention Dr. Darden notes:  I suspect that the primary etiological factor creating progressive changes in cognitive functioning are related to significant microvascular ischemic changes and potential small lacunar infarctions.  This combined with significant pain conditions,  dizziness and vertigo, sustained sleep disturbance, and peripheral neuropathy secondary to type 2 diabetes that is insulin -dependent are also having a contributing factor.  Other issues of concern of the possibility of an underlying obstructive sleep apnea.  Wife is concerned for his memory. MMSE stable 22/30. He has been diagnosed with vascular dementia by formal memory testing.   Prior MRI: MRI of  the cervical spine has multi-level moderate and severe stenosis with foraminal stenosis with compresive myelopathy. I think he needs evaluation for cervical decompression.At one level he has compressive myelopathy. I'd like to send him to Dr. Carollee and/or Dr. Estella. Including them on this correspindence in case they feel differently but I think he needs neurosurgical evaluation and surgery. Please discuss with patent and I will place referral to our wonderful neurosurgical colleagues, thanks  Suspect not drinking enough fluids, encouraged increase in fluids.   Difficulty with full history although likely multifactorial as he reports constant dizziness worsening throughout the day but also reports increased dizziness with position changes or head movements. In the past denied dizziness at rest but today states is continuous even with sitting, can be due to MSA-C  Hx of BPPV with some features consistent with BPPV therefore recommended vestibular rehab as well as ongoing use of meclizine  per PCP and trial of Zofran  as needed. SABRA He went to one vestibular PT session and declined any further because of insurance per PT notes.   He does have prior stroke history, diabetic polyneuropathy with chronic gait impairment and imbalance and ACDF due to compressive myelopathy  Discussed importance of adequate water  intake as well as nutrition as this could be contributing to constant dizziness complaint.  Advised him to further follow-up with PCP for further evaluation in this aspect, could be due to  MSA-C  Follow-up after workup above  Personally examined patient and images,  personally obtained the history, evaluated lab date, reviewed imaging studies and agree with radiology interpretations.     I spent 45 minutes of face-to-face and non-face-to-face time with patient on the  1. Mild dementia without behavioral disturbance, psychotic disturbance, mood disturbance, or anxiety, unspecified dementia type (HCC)   2. Gait abnormality   3. Decreased activities of daily living (ADL)   4. Fine motor impairment   5. Fine motor skill loss   6. Unintended weight loss    diagnosis.  This included previsit chart review, lab review, study review, order entry, electronic health record documentation, patient education on the different diagnostic and therapeutic options, counseling and coordination of care, risks and benefits of management, compliance, or risk factor reduction

## 2024-02-07 ENCOUNTER — Encounter: Payer: Self-pay | Admitting: Neurology

## 2024-02-07 ENCOUNTER — Ambulatory Visit: Payer: Medicare HMO | Admitting: Neurology

## 2024-02-07 VITALS — BP 127/54 | HR 55 | Ht 69.0 in | Wt 167.2 lb

## 2024-02-07 DIAGNOSIS — R29898 Other symptoms and signs involving the musculoskeletal system: Secondary | ICD-10-CM

## 2024-02-07 DIAGNOSIS — R269 Unspecified abnormalities of gait and mobility: Secondary | ICD-10-CM

## 2024-02-07 DIAGNOSIS — Z789 Other specified health status: Secondary | ICD-10-CM

## 2024-02-07 DIAGNOSIS — R634 Abnormal weight loss: Secondary | ICD-10-CM

## 2024-02-07 DIAGNOSIS — F03A0 Unspecified dementia, mild, without behavioral disturbance, psychotic disturbance, mood disturbance, and anxiety: Secondary | ICD-10-CM | POA: Diagnosis not present

## 2024-02-07 DIAGNOSIS — R29818 Other symptoms and signs involving the nervous system: Secondary | ICD-10-CM | POA: Diagnosis not present

## 2024-02-07 NOTE — Patient Instructions (Addendum)
 Topical voltaren for the joints/hands Boost or Ensure supplements Look into [facility] and Medicaid PT/OT/nursing to the house  Fall prevention  Fall Prevention in the Home, Adult Falls can cause injuries and affect people of all ages. There are many simple things that you can do to make your home safe and to help prevent falls. If you need it, ask for help making these changes. What actions can I take to prevent falls? General information Use good lighting in all rooms. Make sure to: Replace any light bulbs that burn out. Turn on lights if it is dark and use night-lights. Keep items that you use often in easy-to-reach places. Lower the shelves around your home if needed. Move furniture so that there are clear paths around it. Do not keep throw rugs or other things on the floor that can make you trip. If any of your floors are uneven, fix them. Add color or contrast paint or tape to clearly mark and help you see: Grab bars or handrails. First and last steps of staircases. Where the edge of each step is. If you use a ladder or stepladder: Make sure that it is fully opened. Do not climb a closed ladder. Make sure the sides of the ladder are locked in place. Have someone hold the ladder while you use it. Know where your pets are as you move through your home. What can I do in the bathroom?     Keep the floor dry. Clean up any water  that is on the floor right away. Remove soap buildup in the bathtub or shower. Buildup makes bathtubs and showers slippery. Use non-skid mats or decals on the floor of the bathtub or shower. Attach bath mats securely with double-sided, non-slip rug tape. If you need to sit down while you are in the shower, use a non-slip stool. Install grab bars by the toilet and in the bathtub and shower. Do not use towel bars as grab bars. What can I do in the bedroom? Make sure that you have a light by your bed that is easy to reach. Do not use any sheets or  blankets on your bed that hang to the floor. Have a firm bench or chair with side arms that you can use for support when you get dressed. What can I do in the kitchen? Clean up any spills right away. If you need to reach something above you, use a sturdy step stool that has a grab bar. Keep electrical cables out of the way. Do not use floor polish or wax that makes floors slippery. What can I do with my stairs? Do not leave anything on the stairs. Make sure that you have a light switch at the top and the bottom of the stairs. Have them installed if you do not have them. Make sure that there are handrails on both sides of the stairs. Fix handrails that are broken or loose. Make sure that handrails are as long as the staircases. Install non-slip stair treads on all stairs in your home if they do not have carpet. Avoid having throw rugs at the top or bottom of stairs, or secure the rugs with carpet tape to prevent them from moving. Choose a carpet design that does not hide the edge of steps on the stairs. Make sure that carpet is firmly attached to the stairs. Fix any carpet that is loose or worn. What can I do on the outside of my home? Use bright outdoor lighting. Repair  the edges of walkways and driveways and fix any cracks. Clear paths of anything that can make you trip, such as tools or rocks. Add color or contrast paint or tape to clearly mark and help you see high doorway thresholds. Trim any bushes or trees on the main path into your home. Check that handrails are securely fastened and in good repair. Both sides of all steps should have handrails. Install guardrails along the edges of any raised decks or porches. Have leaves, snow, and ice cleared regularly. Use sand, salt, or ice melt on walkways during winter months if you live where there is ice and snow. In the garage, clean up any spills right away, including grease or oil spills. What other actions can I take? Review your medicines  with your health care provider. Some medicines can make you confused or feel dizzy. This can increase your chance of falling. Wear closed-toe shoes that fit well and support your feet. Wear shoes that have rubber soles and low heels. Use a cane, walker, scooter, or crutches that help you move around if needed. Talk with your provider about other ways that you can decrease your risk of falls. This may include seeing a physical therapist to learn to do exercises to improve movement and strength. Where to find more information Centers for Disease Control and Prevention, [employer]: TonerPromos.no [employer] on Aging: BaseRingTones.pl National Institute on Aging: BaseRingTones.pl Contact a health care provider if: You are afraid of falling at home. You feel weak, drowsy, or dizzy at home. You fall at home. Get help right away if you: Lose consciousness or have trouble moving after a fall. Have a fall that causes a head injury. These symptoms may be an emergency. Get help right away. Call 911. Do not wait to see if the symptoms will go away. Do not drive yourself to the hospital. This information is not intended to replace advice given to you by your health care provider. Make sure you discuss any questions you have with your health care provider. Document Revised: 03/28/2022 Document Reviewed: 03/28/2022 Elsevier Patient Education  2024 [employer].

## 2024-02-08 ENCOUNTER — Telehealth: Payer: Self-pay | Admitting: Neurology

## 2024-02-08 NOTE — Telephone Encounter (Signed)
[facility] is going to take this patient starting next week.

## 2024-02-21 NOTE — Telephone Encounter (Signed)
 I received this message from [place] with [facility]: Wanted to shoot an update. The branch had spoken with [name] a few times and he declined to schedule an initial evaluation asking to be put off and called back. Most recent attempt he declined stating too much going on right now to participate.

## 2024-03-01 ENCOUNTER — Other Ambulatory Visit: Payer: Self-pay | Admitting: Hematology and Oncology

## 2024-03-01 ENCOUNTER — Inpatient Hospital Stay: Payer: Medicare HMO | Attending: Endocrinology

## 2024-03-01 DIAGNOSIS — D472 Monoclonal gammopathy: Secondary | ICD-10-CM

## 2024-03-20 DIAGNOSIS — R3911 Hesitancy of micturition: Secondary | ICD-10-CM | POA: Diagnosis not present

## 2024-03-20 DIAGNOSIS — N182 Chronic kidney disease, stage 2 (mild): Secondary | ICD-10-CM | POA: Diagnosis not present

## 2024-03-20 DIAGNOSIS — I129 Hypertensive chronic kidney disease with stage 1 through stage 4 chronic kidney disease, or unspecified chronic kidney disease: Secondary | ICD-10-CM | POA: Diagnosis not present

## 2024-03-20 DIAGNOSIS — E785 Hyperlipidemia, unspecified: Secondary | ICD-10-CM | POA: Diagnosis not present

## 2024-03-20 DIAGNOSIS — E1159 Type 2 diabetes mellitus with other circulatory complications: Secondary | ICD-10-CM | POA: Diagnosis not present

## 2024-03-20 DIAGNOSIS — Z794 Long term (current) use of insulin: Secondary | ICD-10-CM | POA: Diagnosis not present

## 2024-03-22 ENCOUNTER — Ambulatory Visit: Admitting: Podiatry

## 2024-04-12 DIAGNOSIS — F01C0 Vascular dementia, severe, without behavioral disturbance, psychotic disturbance, mood disturbance, and anxiety: Secondary | ICD-10-CM | POA: Diagnosis not present

## 2024-04-12 DIAGNOSIS — M899 Disorder of bone, unspecified: Secondary | ICD-10-CM | POA: Diagnosis not present

## 2024-04-12 DIAGNOSIS — F331 Major depressive disorder, recurrent, moderate: Secondary | ICD-10-CM | POA: Diagnosis not present

## 2024-04-12 DIAGNOSIS — R82998 Other abnormal findings in urine: Secondary | ICD-10-CM | POA: Diagnosis not present

## 2024-04-12 DIAGNOSIS — E1159 Type 2 diabetes mellitus with other circulatory complications: Secondary | ICD-10-CM | POA: Diagnosis not present

## 2024-04-12 DIAGNOSIS — N182 Chronic kidney disease, stage 2 (mild): Secondary | ICD-10-CM | POA: Diagnosis not present

## 2024-04-12 DIAGNOSIS — I129 Hypertensive chronic kidney disease with stage 1 through stage 4 chronic kidney disease, or unspecified chronic kidney disease: Secondary | ICD-10-CM | POA: Diagnosis not present

## 2024-04-12 DIAGNOSIS — Z1331 Encounter for screening for depression: Secondary | ICD-10-CM | POA: Diagnosis not present

## 2024-04-12 DIAGNOSIS — E785 Hyperlipidemia, unspecified: Secondary | ICD-10-CM | POA: Diagnosis not present

## 2024-04-12 DIAGNOSIS — Z0000 Encounter for general adult medical examination without abnormal findings: Secondary | ICD-10-CM | POA: Diagnosis not present

## 2024-04-12 DIAGNOSIS — Z1389 Encounter for screening for other disorder: Secondary | ICD-10-CM | POA: Diagnosis not present

## 2024-05-08 DIAGNOSIS — H40013 Open angle with borderline findings, low risk, bilateral: Secondary | ICD-10-CM | POA: Diagnosis not present

## 2024-05-28 DIAGNOSIS — Z833 Family history of diabetes mellitus: Secondary | ICD-10-CM | POA: Diagnosis not present

## 2024-05-28 DIAGNOSIS — R2681 Unsteadiness on feet: Secondary | ICD-10-CM | POA: Diagnosis not present

## 2024-05-28 DIAGNOSIS — I10 Essential (primary) hypertension: Secondary | ICD-10-CM | POA: Diagnosis not present

## 2024-05-28 DIAGNOSIS — H409 Unspecified glaucoma: Secondary | ICD-10-CM | POA: Diagnosis not present

## 2024-05-28 DIAGNOSIS — Z7984 Long term (current) use of oral hypoglycemic drugs: Secondary | ICD-10-CM | POA: Diagnosis not present

## 2024-05-28 DIAGNOSIS — Z8249 Family history of ischemic heart disease and other diseases of the circulatory system: Secondary | ICD-10-CM | POA: Diagnosis not present

## 2024-05-28 DIAGNOSIS — Z809 Family history of malignant neoplasm, unspecified: Secondary | ICD-10-CM | POA: Diagnosis not present

## 2024-05-28 DIAGNOSIS — E785 Hyperlipidemia, unspecified: Secondary | ICD-10-CM | POA: Diagnosis not present

## 2024-05-28 DIAGNOSIS — E119 Type 2 diabetes mellitus without complications: Secondary | ICD-10-CM | POA: Diagnosis not present

## 2024-08-20 ENCOUNTER — Ambulatory Visit: Admitting: Neurology

## 2024-08-30 ENCOUNTER — Inpatient Hospital Stay: Payer: Medicare HMO

## 2024-09-04 ENCOUNTER — Inpatient Hospital Stay: Payer: Self-pay | Admitting: Hematology and Oncology

## 2024-09-06 ENCOUNTER — Ambulatory Visit: Payer: Medicare HMO | Admitting: Hematology and Oncology

## 2024-09-10 ENCOUNTER — Ambulatory Visit: Admitting: Neurology

## 2024-09-11 ENCOUNTER — Telehealth: Payer: Self-pay | Admitting: Hematology and Oncology

## 2024-09-11 NOTE — Telephone Encounter (Signed)
 Spouse called asking to double check pt dates

## 2024-09-13 ENCOUNTER — Telehealth: Payer: Self-pay | Admitting: Hematology and Oncology

## 2024-09-13 ENCOUNTER — Inpatient Hospital Stay

## 2024-09-13 NOTE — Telephone Encounter (Signed)
 Pt wife called to cancel appts

## 2024-09-18 ENCOUNTER — Inpatient Hospital Stay: Admitting: Hematology and Oncology

## 2025-01-30 ENCOUNTER — Ambulatory Visit: Admitting: Neurology
